# Patient Record
Sex: Female | Born: 1960 | Race: Black or African American | Hispanic: No | Marital: Single | Smoking: Never smoker
Health system: Southern US, Community
[De-identification: ages and names within clinical notes are randomized; demographics above are authoritative.]

## PROBLEM LIST (undated history)

## (undated) DIAGNOSIS — I10 Essential (primary) hypertension: Secondary | ICD-10-CM

## (undated) DIAGNOSIS — F329 Major depressive disorder, single episode, unspecified: Secondary | ICD-10-CM

## (undated) DIAGNOSIS — M549 Dorsalgia, unspecified: Secondary | ICD-10-CM

## (undated) DIAGNOSIS — F32A Depression, unspecified: Secondary | ICD-10-CM

## (undated) DIAGNOSIS — F419 Anxiety disorder, unspecified: Secondary | ICD-10-CM

## (undated) HISTORY — PX: MOHS SURGERY: SHX181

## (undated) HISTORY — PX: VAGINAL HYSTERECTOMY: SUR661

## (undated) HISTORY — DX: Anxiety disorder, unspecified: F41.9

## (undated) HISTORY — DX: Essential (primary) hypertension: I10

## (undated) HISTORY — DX: Major depressive disorder, single episode, unspecified: F32.9

## (undated) HISTORY — DX: Depression, unspecified: F32.A

## (undated) HISTORY — PX: CHOLECYSTECTOMY: SHX55

---

## 2014-07-09 HISTORY — PX: OTHER SURGICAL HISTORY: SHX169

## 2017-02-25 ENCOUNTER — Encounter: Payer: Self-pay | Admitting: Physical Therapy

## 2017-02-25 ENCOUNTER — Ambulatory Visit: Payer: Non-veteran care | Attending: Internal Medicine | Admitting: Physical Therapy

## 2017-02-25 DIAGNOSIS — M791 Myalgia, unspecified site: Secondary | ICD-10-CM

## 2017-02-25 DIAGNOSIS — M5442 Lumbago with sciatica, left side: Secondary | ICD-10-CM | POA: Insufficient documentation

## 2017-02-25 DIAGNOSIS — M25571 Pain in right ankle and joints of right foot: Secondary | ICD-10-CM

## 2017-02-25 DIAGNOSIS — M25572 Pain in left ankle and joints of left foot: Secondary | ICD-10-CM | POA: Diagnosis present

## 2017-02-25 DIAGNOSIS — G8929 Other chronic pain: Secondary | ICD-10-CM

## 2017-02-25 NOTE — Patient Instructions (Addendum)
Figure -4 stretch in chair   5 breaths   3-5 x day   __________    Proper body mechanics with getting out of a chair to decrease strain  on back &pelvic floor   Avoid holding your breath when Getting out of the chair:  Scoot to front part of chair chair Heels behind feet, feet are hip width apart, nose over toes  Inhale like you are smelling roses Exhale to stand    _______________  Sitting with feet under knees , flat on floor   _________________  [address] zag massage over abdominal scar

## 2017-02-26 NOTE — Therapy (Deleted)
[place] [facility] MAIN [facility] SERVICES [address] [place], [place], [address] Phone: [phone]   Fax:  [phone]  Physical Therapy Evaluation  Patient Details  Name: Carolyn Gregory MRN: [id number] Date of Birth: September 16, 1960 Referring Provider: Gertha Calkin, MD  Encounter Date: 02/25/2017      PT End of Session - 02/28/17 1454    Visit Number 1   Number of Visits 12   Date for PT Re-Evaluation 05/21/17   Authorization Type g code 1/10    PT Start Time 0805   PT Stop Time 0915   PT Time Calculation (min) 70 min   Activity Tolerance Patient tolerated treatment well;No increased pain   Behavior During Therapy WFL for tasks assessed/performed      Past Medical History:  Diagnosis Date  . Anxiety   . Depression   . Hypertension     Past Surgical History:  Procedure Laterality Date  . CHOLECYSTECTOMY    . MOHS SURGERY     to remove benign growth  located in the L low abdomen  . R ankle surgery   2016   to repair lateral tendon   . VAGINAL HYSTERECTOMY      There were no vitals filed for this visit.       Subjective Assessment - 02/28/17 1452    Subjective 1) R ankle pain: Pt injured herself in 2014. Pt had a fall where she had turned R ankle. PT did not help with rehab and she pushed for an MRI which she received and it showed tendon tear. Pt underwent surgery. Pt is able to walk on it but she experiences soreness. Sometimes she wears a brace for stability. Pt also injured her L ankle in 2016 after t/f from [facility] to toilet.  Pt use a cane when walking long distances because of her LBP/ groin pain as well    2) Pt noticed the R groin pain 6 months ago. Pt had a hsyterectomy in 1999.  Groin pain "burning" occurs most when she is driving, sitting 76/54. Pt also experiences this pain with urination, and laying on her R side. When she stands and shift weight to the R leg, her L groin hurts. Laying down on back  eases the pain 4/10, and eases  to 0/10 with muscle relaxers after 2 hours.    3) CLBP: Pt had slipped and fell, injuring the L LBP 2010. Pt has radiating pain down her posterior thigh and stops at knee.  Pt had Chiropractic Tx which was more helpful with PT.    4) Difficulty with urination: Pt noticed a vibration at the end of urination located in the vaginal area which made her think she was still trickling. Pt has interrupted flow of urine. SUI is reported. Urinary frequency 3x/ 2 hours.    5) Hx of constipation:  Pt has started to increase her water and take in prunes to eliminate 4x/ day. Pt had to strain in the past to eliminate.  Pt with 2 vaginal deliveries, perineal tears,      Pertinent History Pt is interested in seeking psychotherapy/ psychiatry for managing a stress in her life regarding a family member. Pt was tearful at the beginning of session.     Patient Stated Goals improve ROM and decrease pain             [facility] PT Assessment - 02/28/17 1452      Assessment   Medical Diagnosis LBP/ Groin pain  Referring Provider Gertha Calkin, MD     Precautions   Precautions None     Restrictions   Weight Bearing Restrictions No     Home Environment   Additional Comments 12 stairs      Prior Function   Level of Independence Independent     Sit to Stand   Comments no pain with correct technique      AROM   Overall AROM Comments ~30% spinal flexion/ side flexion B , rotation ~25% B with pain     Palpation   SI assessment  standing: L PSIS more anterior    Palpation comment tightness/ tenderness at Obt Int B    severely restricted scar over low abdomen      Ambulation/Gait   Assistive device Straight cane   Gait Comments B ankle instabiliy on stance phase             Objective measurements completed on examination: See above findings.          [facility] Adult PT Treatment/Exercise - 02/28/17 1452      Therapeutic Activites    Therapeutic Activities --  see pt instructions     Neuro  Re-ed    Neuro Re-ed Details  see pt instructions     Manual Therapy   Manual therapy comments long axis distraction , rotational mob, PA mob Grade II  with MWM                 PT Education - 02/28/17 1453    Education provided Yes   Education Details POC, anatomy/ physiology, goals, HEP   Person(s) Educated Patient   Methods Explanation;Demonstration;Tactile cues;Verbal cues;Handout   Comprehension Returned demonstration;Verbalized understanding             PT Long Term Goals - 02/25/17 1651      PT LONG TERM GOAL #1   Title Pt will decrease her ODI score from 48% to < 38% in order to return to ADLs   Time 12   Period Weeks   Status New   Target Date 05/20/17     PT LONG TERM GOAL #2   Title Pt will decrease her LEFS score from   25% to < 18%  in order to walk and have less pain at her R ankle.   Time 12   Period Weeks   Status New   Target Date 05/20/17     PT LONG TERM GOAL #3   Title Pt will demo less B ankle instability, improved gait in order to minimize risk for injuries and falls    Time 12   Period Weeks   Status New   Target Date 05/20/17     PT LONG TERM GOAL #4   Title Pt will demo no pelvic obliquities and normal arthrokinematic motion of SIJ in order to minimize radiating pain to walk longer distances   Time 4   Period Weeks   Status New   Target Date 03/25/17              Patient will benefit from skilled therapeutic intervention in order to improve the following deficits and impairments:  Abnormal gait, Pain, Increased fascial restricitons, Decreased strength, Decreased scar mobility, Increased muscle spasms, Difficulty walking, Decreased mobility, Decreased range of motion, Decreased balance, Decreased safety awareness, Decreased coordination, Postural dysfunction, Improper body mechanics, Decreased endurance  Visit Diagnosis: Chronic bilateral low back pain with left-sided sciatica  Myalgia  Pain in left ankle and joints  of  left foot  Pain in right ankle and joints of right foot     Problem List There are no active problems to display for this patient.   Mariane Masters  ,PT, DPT, E-RYT  02/28/2017, 3:09 PM  [place] [facility] MAIN [facility] SERVICES [address] [place], [place], [address] Phone: [phone]   Fax:  [phone]  Name: SHANNETTE TABARES MRN: [id number] Date of Birth: 10-Jul-1960

## 2017-02-26 NOTE — Therapy (Deleted)
[facility] MAIN [facility] SERVICES [address] [place], [place], [address] Phone: [phone]   Fax:  [phone]  Physical Therapy Evaluation  Patient Details  Name: MEMORY BANNON MRN: [id number] Date of Birth: 24-Sep-1960 Referring Provider: Gertha Calkin, MD   Encounter Date: 02/25/2017      PT End of Session - 02/26/17 1818    Visit Number 1   Number of Visits 12   Date for PT Re-Evaluation 05/21/17   Authorization Type g code 1/10    PT Start Time 0805   PT Stop Time 0915   PT Time Calculation (min) 70 min      Past Medical History:  Diagnosis Date  . Anxiety   . Depression   . Hypertension     Past Surgical History:  Procedure Laterality Date  . CHOLECYSTECTOMY    . MOHS SURGERY     to remove benign growth  located in the L low abdomen  . R ankle surgery   2016   to repair lateral tendon   . VAGINAL HYSTERECTOMY      There were no vitals filed for this visit.       Subjective Assessment - 02/26/17 1821    Subjective 1) R ankle pain: Pt injured herself in 2014. Pt had a fall where she had turned R ankle. PT did not help with rehab and she pushed for an MRI which she received and it showed tendon tear. Pt underwent surgery. Pt is able to walk on it but she experiences soreness. Sometimes she wears a brace for stability. Pt also injured her L ankle in 2016 after t/f from [facility] to toilet.  Pt use a cane when walking long distances because of her LBP/ groin pain as well  2) Pt noticed the R groin pain 6 months ago. Pt had a hsyterectomy in 1999.  Groin pain "burning" occurs most when she is driving, sitting 49/82. Pt also experiences this pain with urination, and laying on her R side. When she stands and shift weight to the R leg, her L groin hurts. Laying down on back  eases the pain 4/10, and eases to 0/10 with muscle relaxers after 2 hours.   3) CLBP: Pt had slipped and fell, injuring the L LBP 2010. Pt has radiating pain down her  posterior thigh and stops at knee.  Pt had Chiropractic Tx which was more helpful with PT.  4) Difficulty with urination: Pt noticed a vibration at the end of urination located in the vaginal area which made her think she was still trickling. Pt has interrupted flow of urine. SUI is reported. Urinary frequency 3x/ 2 hours.  5) Hx of constipation:  Pt has started to increase her water and take in prunes to eliminate 4x/ day. Pt had to strain in the past to eliminate.  Pt with 2 vaginal deliveries, perineal tears,      Pertinent History Pt is interested in seeking psychotherapy/ psychiatry for managing a stress in her life regarding a family member. Pt was tearful at the beginning of session.     Patient Stated Goals improve ROM and decrease pain             [facility] PT Assessment - 02/26/17 1819      Assessment   Medical Diagnosis LBP/ Groin pain    Referring Provider Gertha Calkin, MD      Precautions   Precautions None     Restrictions   Weight Bearing  Restrictions No     Home Environment   Additional Comments 12 stairs      Prior Function   Level of Independence Independent     Sit to Stand   Comments no pain with correct technique      AROM   Overall AROM Comments ~30% spinal flexion/ side flexion B , rotation ~25% B with pain     Palpation   SI assessment  standing: L PSIS more anterior    Palpation comment tightness/ tenderness at Obt Int B    severely restricted scar over low abdomen      Ambulation/Gait   Assistive device Straight cane   Gait Comments B ankle instabiliy on stance phase             Objective measurements completed on examination: See above findings.          [facility] Adult PT Treatment/Exercise - 02/26/17 1816      Therapeutic Activites    Therapeutic Activities --  see pt instructions     Neuro Re-ed    Neuro Re-ed Details  see pt isntructions     Manual Therapy   Manual therapy comments long axis distraction , rotational mob, PA mob  Grade II  with MWM                      PT Long Term Goals - 03/17/17 1651      PT LONG TERM GOAL #1   Title Pt will decrease her ODI score from 48% to < 38% in order to return to ADLs   Time 12   Period Weeks   Status New   Target Date 05/20/17     PT LONG TERM GOAL #2   Title Pt will decrease her LEFS score from   25% to < 18%  in order to walk and have less pain at her R ankle.   Time 12   Period Weeks   Status New   Target Date 05/20/17              Patient will benefit from skilled therapeutic intervention in order to improve the following deficits and impairments:     Visit Diagnosis: Chronic bilateral low back pain with left-sided sciatica  Myalgia  Pain in left ankle and joints of left foot  Pain in right ankle and joints of right foot      G-Codes - March 17, 2017 1817    Functional Assessment Tool Used (Outpatient Only) LEFS, ODI , gait speed    Functional Limitation Mobility: Walking and moving around   Mobility: Walking and Moving Around Current Status [phone]) At least 40 percent but less than 60 percent impaired, limited or restricted   Mobility: Walking and Moving Around Goal Status [phone]) At least 20 percent but less than 40 percent impaired, limited or restricted       Problem List There are no active problems to display for this patient.   Mariane Masters 02/26/2017, 6:21 PM  [facility] MAIN [facility] SERVICES [address] [place], [place], [address] Phone: [phone]   Fax:  [phone]  Name: RAYETTA VEITH MRN: [id number] Date of Birth: 07/29/1960

## 2017-02-28 NOTE — Addendum Note (Signed)
Addended by: Mariane Masters on: 02/28/2017 03:16 PM   Modules accepted: Orders

## 2017-02-28 NOTE — Therapy (Addendum)
[facility] MAIN [facility] SERVICES [address] [place], [place], [address] Phone: [phone]   Fax:  [phone]  Physical Therapy Evaluation  Patient Details  Name: Carolyn Gregory MRN: [id number] Date of Birth: [birth year] Referring Provider: Gertha Calkin, MD  Encounter Date: 02/25/2017      PT End of Session - 02/28/17 1454    Visit Number 1   Number of Visits 12   Date for PT Re-Evaluation 05/21/17   Authorization Type g code 1/10    PT Start Time 0805   PT Stop Time 0915   PT Time Calculation (min) 70 min   Activity Tolerance Patient tolerated treatment well;No increased pain   Behavior During Therapy WFL for tasks assessed/performed      Past Medical History:  Diagnosis Date  . Anxiety   . Depression   . Hypertension     Past Surgical History:  Procedure Laterality Date  . CHOLECYSTECTOMY    . MOHS SURGERY     to remove benign growth  located in the L low abdomen  . R ankle surgery   2016   to repair lateral tendon   . VAGINAL HYSTERECTOMY      There were no vitals filed for this visit.       Subjective Assessment - 02/28/17 1452    Subjective 1) R ankle pain: Pt injured herself in 2014. Pt had a fall where she had turned R ankle. PT did not help with rehab and she pushed for an MRI which she received and it showed tendon tear. Pt underwent surgery. Pt is able to walk on it but she experiences soreness. Sometimes she wears a brace for stability. Pt also injured her L ankle in 2016 after t/f from [facility] to toilet.  Pt use a cane when walking long distances because of her LBP/ groin pain as well  2) Pt noticed the R groin pain 6 months ago. Pt had a hsyterectomy in 1999.  Groin pain "burning" occurs most when she is driving, sitting 13/24. Pt also experiences this pain with urination, and laying on her R side. When she stands and shift weight to the R leg, her L groin hurts. Laying down on back  eases the pain 4/10, and eases to  0/10 with muscle relaxers after 2 hours.   3) CLBP: Pt had slipped and fell, injuring the L LBP 2010. Pt has radiating pain down her posterior thigh and stops at knee.  Pt had Chiropractic Tx which was more helpful with PT.  4) Difficulty with urination: Pt noticed a vibration at the end of urination located in the vaginal area which made her think she was still trickling. Pt has interrupted Gregory of urine. SUI is reported. Urinary frequency 3x/ 2 hours.  5) Hx of constipation:  Pt has started to increase her water and take in prunes to eliminate 4x/ day. Pt had to strain in the past to eliminate.  Pt with 2 vaginal deliveries, perineal tears,      Pertinent History Pt is interested in seeking psychotherapy/ psychiatry for managing a stress in her life regarding a family member. Pt was tearful at the beginning of session.     Patient Stated Goals improve ROM and decrease pain             [facility] PT Assessment - 02/28/17 1452      Assessment   Medical Diagnosis LBP/ Groin pain    Referring Provider Gertha Calkin, MD  Precautions   Precautions None     Restrictions   Weight Bearing Restrictions No     Home Environment   Additional Comments 12 stairs      Prior Function   Level of Independence Independent     Sit to Stand   Comments no pain with correct technique      AROM   Overall AROM Comments ~30% spinal flexion/ side flexion B , rotation ~25% B with pain     Palpation   SI assessment  standing: L PSIS more anterior    Palpation comment tightness/ tenderness at Obt Int B    severely restricted scar over low abdomen      Ambulation/Gait   Assistive device Straight cane   Gait Comments B ankle instabiliy on stance phase             Objective measurements completed on examination: See above findings.          [facility] Adult PT Treatment/Exercise - 02/28/17 1452      Therapeutic Activites    Therapeutic Activities --  see pt instructions     Neuro Re-ed    Neuro  Re-ed Details  see pt instructions     Manual Therapy   Manual therapy comments long axis distraction , rotational mob, PA mob Grade II  with MWM                 PT Education - 02/28/17 1453    Education provided Yes   Education Details POC, anatomy/ physiology, goals, HEP   Person(s) Educated Patient   Methods Explanation;Demonstration;Tactile cues;Verbal cues;Handout   Comprehension Returned demonstration;Verbalized understanding             PT Long Term Goals - 02/25/17 1651      PT LONG TERM GOAL #1   Title Pt will decrease her ODI score from 48% to < 38% in order to return to ADLs   Time 12   Period Weeks   Status New   Target Date 05/20/17     PT LONG TERM GOAL #2   Title Pt will decrease her LEFS score from   25% to < 18%  in order to walk and have less pain at her R ankle.   Time 12   Period Weeks   Status New   Target Date 05/20/17     PT LONG TERM GOAL #3   Title Pt will demo less B ankle instability, improved gait in order to minimize risk for injuries and falls    Time 12   Period Weeks   Status New   Target Date 05/20/17     PT LONG TERM GOAL #4   Title Pt will demo no pelvic obliquities and normal arthrokinematic motion of SIJ in order to minimize radiating pain to walk longer distances   Time 4   Period Weeks   Status New   Target Date 03/25/17     PT LONG TERM GOAL #5   Title Pt will demo no pelvic floor mm tensions and proper ROM in order to eliminate urine and restore pelvic floor function   Time 12   Period Weeks   Status New   Target Date 05/27/17     Additional Long Term Goals   Additional Long Term Goals Yes     PT LONG TERM GOAL #6   Title Pt will demo decreased scar restrictions over abdomen, IND with abdominal massage, and report more regular bowel movements of 1-2  days in order to restore GI health   Time 12   Period Weeks   Status New   Target Date 05/27/17                Plan - 02/28/17 1510    Clinical  Impression Statement Pt is a 56 yo female who reports pain her low back, R groin, and ankle in addition to pelvic floor dysfunction such as  difficulty with urination and constipation. Pt's clinical presentation include pelvic obliquities, limited spinal/ SIJ ROM, pelvic floor mm overactivity, abdominal scar restrictions, gait deviations, ankle instability, dyscoordination of deep core mm, weakness, and poor body mechanics.  Following Tx today, pt showed more mobility in L SIJ and less pain with spinal movements. Pt also demo'd proper sit to stand technique which did not cause pain.    Rehab Potential Good   PT Frequency 1x / week   PT Duration 12 weeks   PT Treatment/Interventions Functional mobility training;Stair training;Gait training;Patient/family education;Aquatic Therapy;Therapeutic activities;Manual lymph drainage;Moist Heat;Neuromuscular re-education;Therapeutic exercise;Manual techniques;Taping;Energy conservation   Consulted and Agree with Plan of Care Patient      Patient will benefit from skilled therapeutic intervention in order to improve the following deficits and impairments:  Abnormal gait, Pain, Increased fascial restricitons, Decreased strength, Decreased scar mobility, Increased muscle spasms, Difficulty walking, Decreased mobility, Decreased range of motion, Decreased balance, Decreased safety awareness, Decreased coordination, Postural dysfunction, Improper body mechanics, Decreased endurance  Visit Diagnosis: Chronic bilateral low back pain with left-sided sciatica  Myalgia  Pain in left ankle and joints of left foot  Pain in right ankle and joints of right foot     Problem List There are no active problems to display for this patient.   Mariane Masters ,PT, DPT, E-RYT  02/28/2017, 3:14 PM  [facility] MAIN [facility] SERVICES [address] [place], [place], [address] Phone: [phone]   Fax:  [phone]  Name: Carolyn Gregory MRN: [id number] Date of Birth: 1961-04-26

## 2017-03-12 ENCOUNTER — Ambulatory Visit: Payer: Non-veteran care | Attending: Internal Medicine | Admitting: Physical Therapy

## 2017-03-12 DIAGNOSIS — M5442 Lumbago with sciatica, left side: Secondary | ICD-10-CM | POA: Diagnosis present

## 2017-03-12 DIAGNOSIS — M25572 Pain in left ankle and joints of left foot: Secondary | ICD-10-CM | POA: Diagnosis present

## 2017-03-12 DIAGNOSIS — G8929 Other chronic pain: Secondary | ICD-10-CM | POA: Insufficient documentation

## 2017-03-12 DIAGNOSIS — M791 Myalgia, unspecified site: Secondary | ICD-10-CM

## 2017-03-12 DIAGNOSIS — M25571 Pain in right ankle and joints of right foot: Secondary | ICD-10-CM

## 2017-03-12 NOTE — Therapy (Signed)
[facility] MAIN [facility] SERVICES [address] [place], [place], [address] Phone: [phone]   Fax:  [phone]  Physical Therapy Treatment  Patient Details  Name: Carolyn Gregory MRN: [id number] Date of Birth: 1961/07/04 Referring Provider: Gertha Calkin, MD  Encounter Date: 03/12/2017      PT End of Session - 03/12/17 1108    Visit Number 2   Number of Visits 12   Date for PT Re-Evaluation 05/21/17   Authorization Type g code 2/10    PT Start Time 1015   PT Stop Time 1110   PT Time Calculation (min) 55 min   Activity Tolerance Patient tolerated treatment well;No increased pain   Behavior During Therapy WFL for tasks assessed/performed      Past Medical History:  Diagnosis Date  . Anxiety   . Depression   . Hypertension     Past Surgical History:  Procedure Laterality Date  . CHOLECYSTECTOMY    . MOHS SURGERY     to remove benign growth  located in the L low abdomen  . R ankle surgery   2016   to repair lateral tendon   . VAGINAL HYSTERECTOMY      There were no vitals filed for this visit.      Subjective Assessment - 03/12/17 1017    Subjective Pt reports the frequency of burning in the R groin has decreased since last session. The frequency of the numbness in the R posterior thigh has also decreased with the stretches.  Pt found comfort in sitting in the driver seat with a pillow prior to PT and she not longer needs the pillow because the pain is not has excuriating.  The numbness /tingling is worst in the R posterior thigh > L.  Pt reports the standing posture technique has helped to decrease the catch in her back     Pertinent History Pt is interested in seeking psychotherapy/ psychiatry for managing a stress in her life regarding a family member. Pt was tearful at the beginning of session.     Patient Stated Goals improve ROM and decrease pain             [facility] PT Assessment - 03/12/17 1052      Palpation   Palpation comment L SIJ hypomobility, limited into hip ext PROM, tenderness and tightness along sacrotuberous ligament      Bed Mobility   Bed Mobility --  bed mobility with proper mechanics w/ minor cues. less pain                      [facility] Adult PT Treatment/Exercise - 03/12/17 1055      Neuro Re-ed    Neuro Re-ed Details  see pt instructions     Modalities   Modalities --  heat, posterior gluts     Manual Therapy   Manual therapy comments LLE  long axis distraction , rotational mob, PA mob Grade II  with MWM                 PT Education - 03/12/17 1108    Education provided Yes   Education Details HEP   Person(s) Educated Patient   Methods Explanation;Demonstration;Tactile cues;Verbal cues;Handout   Comprehension Returned demonstration;Verbalized understanding             PT Long Term Goals - 02/25/17 1651      PT LONG TERM GOAL #1   Title Pt will decrease her ODI  score from 48% to < 38% in order to return to ADLs   Time 12   Period Weeks   Status New   Target Date 05/20/17     PT LONG TERM GOAL #2   Title Pt will decrease her LEFS score from   25% to < 18%  in order to walk and have less pain at her R ankle.   Time 12   Period Weeks   Status New   Target Date 05/20/17     PT LONG TERM GOAL #3   Title Pt will demo less B ankle instability, improved gait in order to minimize risk for injuries and falls    Time 12   Period Weeks   Status New   Target Date 05/20/17     PT LONG TERM GOAL #4   Title Pt will demo no pelvic obliquities and normal arthrokinematic motion of SIJ in order to minimize radiating pain to walk longer distances   Time 4   Period Weeks   Status New   Target Date 03/25/17     PT LONG TERM GOAL #5   Title Pt will demo no pelvic floor mm tensions and proper ROM in order to eliminate urine and restore pelvic floor function   Time 12   Period Weeks   Status New   Target Date 05/27/17     Additional Long Term  Goals   Additional Long Term Goals Yes     PT LONG TERM GOAL #6   Title Pt will demo decreased scar restrictions over abdomen, IND with abdominal massage, and report more regular bowel movements of 1-2 days in order to restore GI health   Time 12   Period Weeks   Status New   Target Date 05/27/17               Plan - 03/12/17 1109    Clinical Impression Statement Pt reports her radiating pain has improved. Pt is able to get out of her chair with less pain. Pt stated she is feeling better mentally about her pain and remains positive about her progress. Today, manual Tx address tensions/ tenderness along L sacrotuberous ligament to increased L SIJ mobility. Pt demo'd less tenderness and tensions post Tx. Pt was able to also demo proper bed mobility mechanics with less pain. Added neuro-muscular re-edu to engage feet in deep core exercise to initiate awareness of lower kinetic chain connection to pelvis. Pt continues to benefit from skilled PT.    Rehab Potential Good   PT Frequency 1x / week   PT Duration 12 weeks   PT Treatment/Interventions Functional mobility training;Stair training;Gait training;Patient/family education;Aquatic Therapy;Therapeutic activities;Manual lymph drainage;Moist Heat;Neuromuscular re-education;Therapeutic exercise;Manual techniques;Taping;Energy conservation   Consulted and Agree with Plan of Care Patient      Patient will benefit from skilled therapeutic intervention in order to improve the following deficits and impairments:  Abnormal gait, Pain, Increased fascial restricitons, Decreased strength, Decreased scar mobility, Increased muscle spasms, Difficulty walking, Decreased mobility, Decreased range of motion, Decreased balance, Decreased safety awareness, Decreased coordination, Postural dysfunction, Improper body mechanics, Decreased endurance  Visit Diagnosis: Chronic bilateral low back pain with left-sided sciatica  Myalgia  Pain in left ankle and  joints of left foot  Pain in right ankle and joints of right foot     Problem List There are no active problems to display for this patient.   Mariane MastersYeung,Shin Yiing 03/12/2017, 11:35 AM  [facility] MAIN REHAB SERVICES  [address] [place], [place], [address] Phone: [phone]   Fax:  [phone]  Name: Carolyn Gregory MRN: [id number] Date of Birth: 27-Jun-1961

## 2017-03-12 NOTE — Patient Instructions (Addendum)
Deep core ( handout ) level (1-2)     Standing:  10 reps on both sides x 3 x day     3 point tap   Feet are hip width Tap forward, center under hip not feet next to each other  Tap middle\, center  Tap back

## 2017-03-18 ENCOUNTER — Ambulatory Visit: Payer: Non-veteran care | Admitting: Physical Therapy

## 2017-03-18 DIAGNOSIS — M5442 Lumbago with sciatica, left side: Principal | ICD-10-CM

## 2017-03-18 DIAGNOSIS — M791 Myalgia, unspecified site: Secondary | ICD-10-CM

## 2017-03-18 DIAGNOSIS — M25572 Pain in left ankle and joints of left foot: Secondary | ICD-10-CM

## 2017-03-18 DIAGNOSIS — G8929 Other chronic pain: Secondary | ICD-10-CM

## 2017-03-18 DIAGNOSIS — M25571 Pain in right ankle and joints of right foot: Secondary | ICD-10-CM

## 2017-03-18 NOTE — Therapy (Signed)
[place] [facility] MAIN [facility] SERVICES [address] [place], [place], [address] Phone: [phone]   Fax:  [phone]  Physical Therapy Treatment  Patient Details  Name: Carolyn Gregory MRN: [id number] Date of Birth: 1961-04-14 Referring Provider: Gertha Calkin, MD  Encounter Date: 03/18/2017      PT End of Session - 03/18/17 1306    Visit Number 3   Number of Visits 12   Date for PT Re-Evaluation 05/21/17   Authorization Type g code 3/10    PT Start Time 1000   PT Stop Time 1058   PT Time Calculation (min) 58 min   Activity Tolerance Patient tolerated treatment well;No increased pain   Behavior During Therapy WFL for tasks assessed/performed      Past Medical History:  Diagnosis Date  . Anxiety   . Depression   . Hypertension     Past Surgical History:  Procedure Laterality Date  . CHOLECYSTECTOMY    . MOHS SURGERY     to remove benign growth  located in the L low abdomen  . R ankle surgery   2016   to repair lateral tendon   . VAGINAL HYSTERECTOMY      There were no vitals filed for this visit.      Subjective Assessment - 03/18/17 0927    Subjective Pt reports there was some soreness and slight discomfort in the L side of her gluts following last session.    Pertinent History Pt is interested in seeking psychotherapy/ psychiatry for managing a stress in her life regarding a family member. Pt was tearful at the beginning of session.     Patient Stated Goals improve ROM and decrease pain             [facility] PT Assessment - 03/18/17 1008      Palpation   Palpation comment increased L hip ext mobility, increased mm tensions at posterior iliac crest ( glut )      Ambulation/Gait   Gait Comments narrow BOS, posterior COM, limited hip flexion and decreased stance phase                      [facility] Adult PT Treatment/Exercise - 03/18/17 1005      Neuro Re-ed    Neuro Re-ed Details  gait training with blue band at  waist, cued for anterior COM, hip flexion, increased weightbearing into stance phase and feet wider      Exercises   Exercises --  see pt instructions     Manual Therapy   Manual therapy comments LLE  long axis distraction , rotational mob, PA mob Grade II  with MWM                 PT Education - 03/18/17 1004    Education provided Yes   Education Details HEP   Person(s) Educated Patient   Methods Explanation;Demonstration;Tactile cues;Verbal cues;Handout   Comprehension Returned demonstration;Verbalized understanding             PT Long Term Goals - 02/25/17 1651      PT LONG TERM GOAL #1   Title Pt will decrease her ODI score from 48% to < 38% in order to return to ADLs   Time 12   Period Weeks   Status New   Target Date 05/20/17     PT LONG TERM GOAL #2   Title Pt will decrease her LEFS score from   25% to <  18%  in order to walk and have less pain at her R ankle.   Time 12   Period Weeks   Status New   Target Date 05/20/17     PT LONG TERM GOAL #3   Title Pt will demo less B ankle instability, improved gait in order to minimize risk for injuries and falls    Time 12   Period Weeks   Status New   Target Date 05/20/17     PT LONG TERM GOAL #4   Title Pt will demo no pelvic obliquities and normal arthrokinematic motion of SIJ in order to minimize radiating pain to walk longer distances   Time 4   Period Weeks   Status New   Target Date 03/25/17     PT LONG TERM GOAL #5   Title Pt will demo no pelvic floor mm tensions and proper ROM in order to eliminate urine and restore pelvic floor function   Time 12   Period Weeks   Status New   Target Date 05/27/17     Additional Long Term Goals   Additional Long Term Goals Yes     PT LONG TERM GOAL #6   Title Pt will demo decreased scar restrictions over abdomen, IND with abdominal massage, and report more regular bowel movements of 1-2 days in order to restore GI health   Time 12   Period Weeks    Status New   Target Date 05/27/17               Plan - 03/18/17 1010    Clinical Impression Statement Pt demo'd increased L hip extension and decreased tensions in the glut compared to last session. Contiued to apply manual Tx to minimize tightness at proximal attachments of glut mm at posterior iliac crest. Provided gait training today to minmize posterior COM, increase hip flexion and weight bearing in stance phase. Pt will continue to increase postural stability and decrease thoracic hypomobility to minimize excessive pelvic movements and increase arm swing/ trunk rotation. Pt is continuing to benefit from skilled PT    Rehab Potential Good   PT Frequency 1x / week   PT Duration 12 weeks   PT Treatment/Interventions Functional mobility training;Stair training;Gait training;Patient/family education;Aquatic Therapy;Therapeutic activities;Manual lymph drainage;Moist Heat;Neuromuscular re-education;Therapeutic exercise;Manual techniques;Taping;Energy conservation   Consulted and Agree with Plan of Care Patient      Patient will benefit from skilled therapeutic intervention in order to improve the following deficits and impairments:  Abnormal gait, Pain, Increased fascial restricitons, Decreased strength, Decreased scar mobility, Increased muscle spasms, Difficulty walking, Decreased mobility, Decreased range of motion, Decreased balance, Decreased safety awareness, Decreased coordination, Postural dysfunction, Improper body mechanics, Decreased endurance  Visit Diagnosis: Chronic bilateral low back pain with left-sided sciatica  Myalgia  Pain in left ankle and joints of left foot  Pain in right ankle and joints of right foot     Problem List There are no active problems to display for this patient.   Carolyn Gregory,Carolyn Gregory ,PT, DPT, E-RYT  03/18/2017, 1:10 PM  [facility] [address] , [place], [address] Phone:  [phone]   Fax:  [phone]  Name: Carolyn Gregory MRN: [id number] Date of Birth: [birth year]

## 2017-03-18 NOTE — Patient Instructions (Signed)
Side of hip stretch:  Reclined twist for hips and side of the hips/ legs  Lay on your back, knees bend Scoot hips to the R , leave shoulders in place Strap on ballmounds of R foot, hold strap in L hand Straighten L leg and drop R leg over to the L, resting onto pillows to keep leg at the same width of hips 5 breaths   Switch sides    _______  Walking with feet hip width apart  Arm swings  _________   Stretches   Six direction  of spine :  Arm swings Side to side hand on shoulder  5 reps each   Mini squat ( forward lean)  To rise up,  feet into ground, knees forward, scoop pelvis under, chest lift  Stand with knees slightly unlocked

## 2017-03-26 ENCOUNTER — Ambulatory Visit: Payer: Non-veteran care | Admitting: Physical Therapy

## 2017-03-26 DIAGNOSIS — M5442 Lumbago with sciatica, left side: Principal | ICD-10-CM

## 2017-03-26 DIAGNOSIS — G8929 Other chronic pain: Secondary | ICD-10-CM

## 2017-03-26 DIAGNOSIS — M25572 Pain in left ankle and joints of left foot: Secondary | ICD-10-CM

## 2017-03-26 DIAGNOSIS — M791 Myalgia, unspecified site: Secondary | ICD-10-CM

## 2017-03-26 DIAGNOSIS — M25571 Pain in right ankle and joints of right foot: Secondary | ICD-10-CM

## 2017-03-26 NOTE — Patient Instructions (Addendum)
[email or web address] Counselor  ?[phone]    Caryn Section, MD , PA  Psychiatrist  [address] [facility] [phone]   _________  Prone Heel Press for strengthening sacro-iliac joints  1. Lie on your belly. If you have an arch in your low back or it feels umcomfortable, place a pillow under your low belly/hips to make sure your low back feel comfortable.   2. Place our forehead on top of your palms.      Widen your knees apart for starting position.   3. Inhale, feel belly and low back expand  4. Exhale, feel belly hug in, press heel together and count aloud for 5 sec. Then relax the heel squeezing.  Perform 10 reps of 5 sec holds. 2 sets/ day.    If you feel entire buttock tighten too much or feel low back pain, apply 50% less effort. As you press your heel together, you will feel as if your pubic bone (front of your pelvis) and sacrum (back of your pelvis) gentle move towards each other or your low abdominal muscles hug in more.       And     Squeezing a double foled pillow between knees   10 reps   5 sec   _______   Last session's 3 point tap on both legs

## 2017-03-26 NOTE — Therapy (Addendum)
[place] MAIN [facility] SERVICES [address] [place], [place], [address] Phone: [phone]   Fax:  [phone]  Physical Therapy Treatment  Patient Details  Name: Carolyn Gregory MRN: [id number] Date of Birth: 06-Jan-1961 Referring Provider: Marsh Dolly, MD  Encounter Date: 03/26/2017      PT End of Session - 03/26/17 1126    Visit Number 4   Number of Visits 12   Date for PT Re-Evaluation 05/21/17   Authorization Type g code 4/10    PT Start Time 6962   PT Stop Time 1128   PT Time Calculation (min) 73 min   Activity Tolerance Patient tolerated treatment well;No increased pain   Behavior During Therapy WFL for tasks assessed/performed      Past Medical History:  Diagnosis Date  . Anxiety   . Depression   . Hypertension     Past Surgical History:  Procedure Laterality Date  . CHOLECYSTECTOMY    . MOHS SURGERY     to remove benign growth  located in the L low abdomen  . R ankle surgery   2016   to repair lateral tendon   . VAGINAL HYSTERECTOMY      There were no vitals filed for this visit.      Subjective Assessment - 03/26/17 1020    Subjective Pt reports she feel improvement in the burning sensation  in her R groin. Pt reported she trusted the process and did her homework and realized that after last session, the treatment did not make her L LBP worst and she recalled the education PT explained and was able to push past the discomfort. Today, ptsays "she is feeling ok".  Pt is concerned about her R side on the tight rubber band feeling posterior thigh and burning feeling in the seated position but these sensatiosn are not has bad as when she initially started her sessions here.      Pertinent History Pt is interested in seeking psychotherapy/ psychiatry for managing a stress in her life regarding a family member. Pt was tearful at the beginning of session.     Patient Stated Goals improve ROM and decrease pain              [facility] PT Assessment - 03/26/17 1122      AROM   Overall AROM Comments pain but no burning at R groin with hip flex/ IR and hip flex / ER abd. ( post Tx: no pain) . tightness at posterior thigh, sacrotuberous ligament, coccgygeus R, limited mobility of sacrum into counternutation      Palpation   SI assessment  R ASIS more inferior ( post Tx: levelled)                      [facility] Adult PT Treatment/Exercise - 03/26/17 1122      Exercises   Exercises --  see pt instructions     Modalities   Modalities --  heat, posterior gluts     Manual Therapy   Manual therapy comments:   Pt was explained about doffing pants and undergarments if need to perform internal pelvic assessment. Pt was explained the details of the exam and pt consented verbally.  RLE long axis distraction, STM along R coccygeus, inferior mob of sacrum, MET, counterstrain for pubic symphysis realignment, MWM at supra pubic with hip IR/ER, and AP mob for SIJ mobility    . All manual Tx was performed externally and internal pelvic  floor assessment was not needed today.                 PT Education - 03/26/17 1043    Education provided Yes   Education Details HEP   Person(s) Educated Patient   Methods Explanation;Demonstration;Tactile cues;Verbal cues;Handout   Comprehension Returned demonstration;Verbalized understanding             PT Long Term Goals - 03/26/17 1135      PT LONG TERM GOAL #1   Title Pt will decrease her ODI score from 48% to < 38% in order to return to ADLs   Time 12   Period Weeks   Status On-going     PT LONG TERM GOAL #2   Title Pt will decrease her LEFS score from   25% to < 18%  in order to walk and have less pain at her R ankle.   Time 12   Period Weeks   Status On-going     PT LONG TERM GOAL #3   Title Pt will demo less B ankle instability, improved gait in order to minimize risk for injuries and falls    Time 12   Period Weeks   Status On-going      PT LONG TERM GOAL #4   Title Pt will demo no pelvic obliquities and normal arthrokinematic motion of SIJ in order to minimize radiating pain to walk longer distances   Time 4   Period Weeks   Status Partially Met     PT LONG TERM GOAL #5   Title Pt will demo no pelvic floor mm tensions and proper ROM in order to eliminate urine and restore pelvic floor function   Time 12   Period Weeks   Status On-going     PT LONG TERM GOAL #6   Title Pt will demo decreased scar restrictions over abdomen, IND with abdominal massage, and report more regular bowel movements of 1-2 days in order to restore GI health   Time 12   Period Weeks   Status On-going               Plan - 03/26/17 1126    Clinical Impression Statement Pt is progressing well with less burning sensation in the R groin area and is able to walking better. Pt showed improved sacral mobility and a symmetrically levelled pelvic girdle. pPt achieved increased hip ROM in hip flex/ abd/add/ IR/ ER without burning sensation and pain.  Pt continues to benefit from skilled PT to address her sacoiliac dysfunction, LBP, and ankle stability issues.  Pt was provided psychotherapy referrals in the area per her request.        Rehab Potential Good   PT Frequency 1x / week   PT Duration 12 weeks   PT Treatment/Interventions Functional mobility training;Stair training;Gait training;Patient/family education;Aquatic Therapy;Therapeutic activities;Manual lymph drainage;Moist Heat;Neuromuscular re-education;Therapeutic exercise;Manual techniques;Taping;Energy conservation   Consulted and Agree with Plan of Care Patient      Patient will benefit from skilled therapeutic intervention in order to improve the following deficits and impairments:  Abnormal gait, Pain, Increased fascial restricitons, Decreased strength, Decreased scar mobility, Increased muscle spasms, Difficulty walking, Decreased mobility, Decreased range of motion, Decreased balance,  Decreased safety awareness, Decreased coordination, Postural dysfunction, Improper body mechanics, Decreased endurance  Visit Diagnosis: Chronic bilateral low back pain with left-sided sciatica  Myalgia  Pain in left ankle and joints of left foot  Pain in right ankle and joints of right foot  Problem List There are no active problems to display for this patient.   [name] ,PT, DPT, E-RYT  03/26/2017, 11:37 AM  [place] MAIN [facility] SERVICES [address] [place], [place], [address] Phone: [phone]   Fax:  [phone]  Name: IRELYN PERFECTO MRN: [id number] Date of Birth: 03-23-1961

## 2017-04-08 ENCOUNTER — Ambulatory Visit: Payer: Non-veteran care | Attending: Internal Medicine | Admitting: Physical Therapy

## 2017-04-08 DIAGNOSIS — G8929 Other chronic pain: Secondary | ICD-10-CM | POA: Diagnosis present

## 2017-04-08 DIAGNOSIS — M5442 Lumbago with sciatica, left side: Secondary | ICD-10-CM | POA: Insufficient documentation

## 2017-04-08 DIAGNOSIS — M791 Myalgia, unspecified site: Secondary | ICD-10-CM | POA: Diagnosis present

## 2017-04-08 DIAGNOSIS — M25571 Pain in right ankle and joints of right foot: Secondary | ICD-10-CM | POA: Diagnosis present

## 2017-04-08 DIAGNOSIS — M25572 Pain in left ankle and joints of left foot: Secondary | ICD-10-CM | POA: Insufficient documentation

## 2017-04-08 NOTE — Patient Instructions (Signed)
Stretches for your legs: LAYING on Back Use upper arms and elbows for stability when pulling strap  For hip joint  Strap on ballmound: _ L knee bent, R ballmound against strap and spread toes, rolling foot 15 deg out and in across midline.  10 reps each side  _knee bends 10 reps    For hamstrings  _L toes point to armpit with knee straight, and then bend knee and straight 10 reps  _ L toes point towards midline, with knee straight, and then bend knee and straight 10 reps    _____  Scoot hips over to L , drop knees to pillow (between pillow)  Strap on L foot, straighten knee   ______  Laying on back, knees bent:  Heel slide back and rock knee to R and slide out

## 2017-04-09 ENCOUNTER — Ambulatory Visit: Payer: Non-veteran care | Admitting: Physical Therapy

## 2017-04-09 NOTE — Therapy (Addendum)
[place] MAIN [facility] SERVICES [address] [place], [place], [address] Phone: [phone]   Fax:  [phone]  Physical Therapy Treatment  Patient Details  Name: Carolyn Gregory MRN: [id number] Date of Birth: January 25, 1961 Referring Provider: Marsh Dolly, MD  Encounter Date: 04/08/2017      PT End of Session - 04/09/17 1255    Visit Number 5   Number of Visits 12   Date for PT Re-Evaluation 05/21/17   Authorization Type g code 5   PT Start Time 9371   PT Stop Time 1710   PT Time Calculation (min) 65 min   Activity Tolerance Patient tolerated treatment well;No increased pain   Behavior During Therapy WFL for tasks assessed/performed      Past Medical History:  Diagnosis Date  . Anxiety   . Depression   . Hypertension     Past Surgical History:  Procedure Laterality Date  . CHOLECYSTECTOMY    . MOHS SURGERY     to remove benign growth  located in the L low abdomen  . R ankle surgery   2016   to repair lateral tendon   . VAGINAL HYSTERECTOMY      There were no vitals filed for this visit.      Subjective Assessment - 04/08/17 1612    Subjective Pt noticed pain and soreness on the L glut area but she also notices she is gaining strength in her legs to get up from her chair without using arms and to walk with out her cane.  Last night, she had difficulty sleeping due to the degree of burning in the groin area on the R but it is overall improvement compared to the time before therapy.              [facility] PT Assessment - 04/09/17 1254      Palpation   Palpation comment tenderness/ tensions at hamstring semitendinosus, L glut med, limited mobility at L SIJ                   Pelvic Floor Special Questions - 04/08/17 1618    Pelvic Floor Internal Exam pt was explained the details of the exam and pt consented verbally. Exam was not performed due to addressing SIJ findings            [facility] Adult PT  Treatment/Exercise - 04/09/17 1255      Exercises   Exercises --  see pt instructions     Manual Therapy   Manual therapy comments AP mob L, hip flex/ add/ abd/ IR/ER,  STM along problem areas with MWM                  PT Education - 04/08/17 1617    Education provided Yes   Education Details HEP   Person(s) Educated Patient   Methods Explanation;Demonstration;Tactile cues;Verbal cues;Handout   Comprehension Returned demonstration;Verbalized understanding             PT Long Term Goals - 03/26/17 1135      PT LONG TERM GOAL #1   Title Pt will decrease her ODI score from 48% to < 38% in order to return to ADLs   Time 12   Period Weeks   Status On-going     PT LONG TERM GOAL #2   Title Pt will decrease her LEFS score from   25% to < 18%  in order to walk and have less pain at her R  ankle.   Time 12   Period Weeks   Status On-going     PT LONG TERM GOAL #3   Title Pt will demo less B ankle instability, improved gait in order to minimize risk for injuries and falls    Time 12   Period Weeks   Status On-going     PT LONG TERM GOAL #4   Title Pt will demo no pelvic obliquities and normal arthrokinematic motion of SIJ in order to minimize radiating pain to walk longer distances   Time 4   Period Weeks   Status Partially Met     PT LONG TERM GOAL #5   Title Pt will demo no pelvic floor mm tensions and proper ROM in order to eliminate urine and restore pelvic floor function   Time 12   Period Weeks   Status On-going     PT LONG TERM GOAL #6   Title Pt will demo decreased scar restrictions over abdomen, IND with abdominal massage, and report more regular bowel movements of 1-2 days in order to restore GI health   Time 12   Period Weeks   Status On-going               Plan - 04/09/17 1256    Clinical Impression Statement Pt is making progress with improve gait. Pt walked into clinic without cane but PT advised her to still use it in busy  environments.  Addressed remaining SIJ limitations and L glut/ hamstring tightness which decreased with manual Tx. Pt reported feeling less pain and soreness in these areas post Tx. Plan to address lower kinetic chain at next visit to address pt's Hx of ankle instability. Pt continues to benefit from skilled PT.    Rehab Potential Good   PT Frequency 1x / week   PT Duration 12 weeks   PT Treatment/Interventions Functional mobility training;Stair training;Gait training;Patient/family education;Aquatic Therapy;Therapeutic activities;Manual lymph drainage;Moist Heat;Neuromuscular re-education;Therapeutic exercise;Manual techniques;Taping;Energy conservation   Consulted and Agree with Plan of Care Patient      Patient will benefit from skilled therapeutic intervention in order to improve the following deficits and impairments:  Abnormal gait, Pain, Increased fascial restricitons, Decreased strength, Decreased scar mobility, Increased muscle spasms, Difficulty walking, Decreased mobility, Decreased range of motion, Decreased balance, Decreased safety awareness, Decreased coordination, Postural dysfunction, Improper body mechanics, Decreased endurance  Visit Diagnosis: Chronic bilateral low back pain with left-sided sciatica  Myalgia  Pain in left ankle and joints of left foot  Pain in right ankle and joints of right foot     Problem List There are no active problems to display for this patient.   [name] ,PT, DPT, E-RYT  04/09/2017, 12:59 PM  [place] MAIN [facility] SERVICES [address] [place], [place], [address] Phone: [phone]   Fax:  [phone]  Name: Carolyn Gregory MRN: [id number] Date of Birth: [birth year]

## 2017-04-23 ENCOUNTER — Ambulatory Visit: Payer: Non-veteran care | Admitting: Physical Therapy

## 2017-04-23 DIAGNOSIS — M25572 Pain in left ankle and joints of left foot: Secondary | ICD-10-CM

## 2017-04-23 DIAGNOSIS — M5442 Lumbago with sciatica, left side: Secondary | ICD-10-CM | POA: Diagnosis not present

## 2017-04-23 DIAGNOSIS — M791 Myalgia, unspecified site: Secondary | ICD-10-CM

## 2017-04-23 DIAGNOSIS — G8929 Other chronic pain: Secondary | ICD-10-CM

## 2017-04-23 DIAGNOSIS — M25571 Pain in right ankle and joints of right foot: Secondary | ICD-10-CM

## 2017-04-23 NOTE — Therapy (Signed)
[place] MAIN [facility] SERVICES [address] [place], [place], [address] Phone: [phone]   Fax:  [phone]  Physical Therapy Treatment  Patient Details  Name: Carolyn Gregory MRN: [id number] Date of Birth: March 15, 1961 Referring Provider: Marsh Dolly, MD  Encounter Date: 04/23/2017      PT End of Session - 04/23/17 1622    Visit Number 6   Number of Visits 12   Date for PT Re-Evaluation 05/21/17   Authorization Type g code 6   PT Start Time 9675   PT Stop Time 1710   PT Time Calculation (min) 55 min   Activity Tolerance Patient tolerated treatment well;No increased pain   Behavior During Therapy WFL for tasks assessed/performed      Past Medical History:  Diagnosis Date  . Anxiety   . Depression   . Hypertension     Past Surgical History:  Procedure Laterality Date  . CHOLECYSTECTOMY    . MOHS SURGERY     to remove benign growth  located in the L low abdomen  . R ankle surgery   2016   to repair lateral tendon   . VAGINAL HYSTERECTOMY      There were no vitals filed for this visit.      Subjective Assessment - 04/23/17 1622    Subjective Pt reported L calf has been cramping for several times a week since early June.  The cramping has become more frequent in the L.  The R calf has started to cramp up once a week.  Pt has been able to walk to the clniic without surfaces.  Pt continues to report the front pubic pain with burning with leaning forward.             [facility] PT Assessment - 04/23/17 1704      Coordination   Gross Motor Movements are Fluid and Coordinated --  R radiating pain down back of leg w/head turn deep core leve                  Pelvic Floor Special Questions - 04/23/17 1659    Pelvic Floor Internal Exam pt was explained the details of the exam and pt consented verbally without contraindications .    Exam Type Vaginal   Palpation burning sensation reported at R lateral border of  bladder wall,  bladder more distal position, post Tx: more cranial position with pillow under hips and manual Tx    Strength fair squeeze, definite lift   Strength # of reps --  4 reps,discontinued  due to report of burning   Strength # of seconds 1           [facility] Adult PT Treatment/Exercise - 04/23/17 1659      Neuro Re-ed    Neuro Re-ed Details  se pt instructions     Manual Therapy   Internal Pelvic Floor gentle thiele massage at bulbospongiosus R and lateral to bladder wall on R                  PT Education - 04/23/17 1705    Education provided Yes   Education Details HEP   Person(s) Educated Patient   Methods Explanation;Demonstration;Tactile cues;Verbal cues;Handout   Comprehension Returned demonstration;Verbalized understanding             PT Long Term Goals - 03/26/17 1135      PT LONG TERM GOAL #1   Title Pt will decrease her ODI score  from 48% to < 38% in order to return to ADLs   Time 12   Period Weeks   Status On-going     PT LONG TERM GOAL #2   Title Pt will decrease her LEFS score from   25% to < 18%  in order to walk and have less pain at her R ankle.   Time 12   Period Weeks   Status On-going     PT LONG TERM GOAL #3   Title Pt will demo less B ankle instability, improved gait in order to minimize risk for injuries and falls    Time 12   Period Weeks   Status On-going     PT LONG TERM GOAL #4   Title Pt will demo no pelvic obliquities and normal arthrokinematic motion of SIJ in order to minimize radiating pain to walk longer distances   Time 4   Period Weeks   Status Partially Met     PT LONG TERM GOAL #5   Title Pt will demo no pelvic floor mm tensions and proper ROM in order to eliminate urine and restore pelvic floor function   Time 12   Period Weeks   Status On-going     PT LONG TERM GOAL #6   Title Pt will demo decreased scar restrictions over abdomen, IND with abdominal massage, and report more regular bowel movements  of 1-2 days in order to restore GI health   Time 12   Period Weeks   Status On-going               Plan - 04/23/17 1706    Clinical Impression Statement Assessed pelvic floor today which showed a more lowered position of bladder. Burning sensation in the R anterior pubic/ groin area was reprouced with palpation by the lateral border of bladder. This sensation was improved after manual Tx and with pt being able to demo complete closure of pelvic floor mm in a sequential way.  Modified deep core exercise by discontinuing hip abduction /ER due to report of radiating pain.  Instructed small heel slides for deep core co-activation which pt reported did not cause radiating pain.  Pt was intructed to not perform kegels or strengthening due to return of burning sensation with contractions .  Pt continues to benefit from skilled PT        Rehab Potential Good   PT Frequency 1x / week   PT Duration 12 weeks   PT Treatment/Interventions Functional mobility training;Stair training;Gait training;Patient/family education;Aquatic Therapy;Therapeutic activities;Manual lymph drainage;Moist Heat;Neuromuscular re-education;Therapeutic exercise;Manual techniques;Taping;Energy conservation   Consulted and Agree with Plan of Care Patient      Patient will benefit from skilled therapeutic intervention in order to improve the following deficits and impairments:  Abnormal gait, Pain, Increased fascial restricitons, Decreased strength, Decreased scar mobility, Increased muscle spasms, Difficulty walking, Decreased mobility, Decreased range of motion, Decreased balance, Decreased safety awareness, Decreased coordination, Postural dysfunction, Improper body mechanics, Decreased endurance  Visit Diagnosis: Chronic bilateral low back pain with left-sided sciatica  Myalgia  Pain in left ankle and joints of left foot  Pain in right ankle and joints of right foot     Problem List There are no active problems  to display for this patient.   Carolyn Gregory ,PT, DPT, E-RYT  04/23/2017, 5:10 PM  [place] MAIN [facility] SERVICES [address] [place], [place], [address] Phone: [phone]   Fax:  [phone]  Name: Carolyn Gregory MRN:  [id number] Date of Birth: Dec 09, 1960

## 2017-04-23 NOTE — Patient Instructions (Signed)
Deep core level 2  ( hip elevated by pillow)

## 2017-05-07 ENCOUNTER — Ambulatory Visit: Payer: Non-veteran care | Admitting: Physical Therapy

## 2017-05-07 DIAGNOSIS — M5442 Lumbago with sciatica, left side: Principal | ICD-10-CM

## 2017-05-07 DIAGNOSIS — M791 Myalgia, unspecified site: Secondary | ICD-10-CM

## 2017-05-07 DIAGNOSIS — G8929 Other chronic pain: Secondary | ICD-10-CM

## 2017-05-07 DIAGNOSIS — M25572 Pain in left ankle and joints of left foot: Secondary | ICD-10-CM

## 2017-05-07 DIAGNOSIS — M25571 Pain in right ankle and joints of right foot: Secondary | ICD-10-CM

## 2017-05-07 NOTE — Therapy (Signed)
[place] MAIN [facility] SERVICES [address] [place], [place], [address] Phone: [phone]   Fax:  [phone]  Physical Therapy Treatment / Progress Note  Patient Details  Name: Carolyn Gregory MRN: [id number] Date of Birth: Mar 17, 1961 Referring Provider: Marsh Dolly, MD  Encounter Date: 05/07/2017      PT End of Session - 05/07/17 1101    Visit Number 7   Number of Visits 12   Date for PT Re-Evaluation 05/21/17   Authorization Type g code 7   PT Start Time 1013   PT Stop Time 1100   PT Time Calculation (min) 47 min   Activity Tolerance Patient tolerated treatment well;No increased pain   Behavior During Therapy WFL for tasks assessed/performed      Past Medical History:  Diagnosis Date  . Anxiety   . Depression   . Hypertension     Past Surgical History:  Procedure Laterality Date  . CHOLECYSTECTOMY    . MOHS SURGERY     to remove benign growth  located in the L low abdomen  . R ankle surgery   2016   to repair lateral tendon   . VAGINAL HYSTERECTOMY      There were no vitals filed for this visit.      Subjective Assessment - 05/07/17 1020    Subjective Pt reports R groin pain is no where near the excruiating pain where it was before. Pt notices it more with bending forward to reach something and when the leg is extended with driving. Pt feels radiating numbness on the posterior R thigh in all positions but not as intense.             [facility] PT Assessment - 05/07/17 1051      Strength   Overall Strength Comments toe extensions, PF, DF  B 4/5,                   Pelvic Floor Special Questions - 05/07/17 1050    Pelvic Floor Internal Exam pt was explained the details of the exam and pt consented verbally without contraindications .    Exam Type Vaginal   Palpation decreased burning sensation ischial spine 10/10 at last session, today 6/10 R    increased tensions at obt int/ iliococcygeus R > L             [facility] Adult PT Treatment/Exercise - 05/07/17 1049      Exercises   Exercises --  see pt instructions     Modalities   Modalities --  heat, posterior gluts/ perineum with exercises     Manual Therapy   Internal Pelvic Floor gentle thiele massage at R obt int /iliococcgyeus                 PT Education - 05/07/17 1101    Education provided Yes   Education Details HEP   Person(s) Educated Patient   Methods Explanation;Demonstration;Tactile cues;Verbal cues;Handout   Comprehension Returned demonstration;Verbalized understanding             PT Long Term Goals - 05/07/17 1105      PT LONG TERM GOAL #1   Title Pt will decrease her ODI score from 48% to < 38% in order to return to ADLs ( 10/30: 46%)    Time 12   Period Weeks   Status Partially Met     PT LONG TERM GOAL #2   Title Pt will decrease her LEFS score  from   25% to < 18%  in order to walk and have less pain at her R ankle.    Time 12   Period Weeks   Status Partially Met     PT LONG TERM GOAL #3   Title Pt will demo less B ankle instability, improved gait in order to minimize risk for injuries and falls    Time 12   Period Weeks   Status Partially Met     PT LONG TERM GOAL #4   Title Pt will demo no pelvic obliquities and normal arthrokinematic motion of SIJ in order to minimize radiating pain to walk longer distances   Time 4   Period Weeks   Status Achieved     PT LONG TERM GOAL #5   Title Pt will demo no pelvic floor mm tensions and proper ROM in order to eliminate urine and restore pelvic floor function   Time 12   Period Weeks   Status Partially Met     PT LONG TERM GOAL #6   Title Pt will demo decreased scar restrictions over abdomen, IND with abdominal massage, and report more regular bowel movements of 1-2 days in order to restore GI health   Time 12   Period Weeks   Status Achieved               Plan - 05/07/17 1101    Clinical Impression Statement Pt demo'd  signficantly less pelvic floor tightness with a signficantly improved position of bladder that is no longer lowered. Pt also showed weakness in B ankles which new HEP have been implemented to address in open and closed chain positions. Given pt's history of ankle issues, an interdependent regional approach to strengthen ankle strength will help minimize overactivity of pelvic floor for longer lasting benefits.  Pt continues to reported decreased radiating pain in R posterior thigh and less pain in the R groin. Pt is progressing well towatrds her goals with skilled PT.     Rehab Potential Good   PT Frequency 1x / week   PT Duration 12 weeks   PT Treatment/Interventions Functional mobility training;Stair training;Gait training;Patient/family education;Aquatic Therapy;Therapeutic activities;Manual lymph drainage;Moist Heat;Neuromuscular re-education;Therapeutic exercise;Manual techniques;Taping;Energy conservation   Consulted and Agree with Plan of Care Patient      Patient will benefit from skilled therapeutic intervention in order to improve the following deficits and impairments:  Abnormal gait, Pain, Increased fascial restricitons, Decreased strength, Decreased scar mobility, Increased muscle spasms, Difficulty walking, Decreased mobility, Decreased range of motion, Decreased balance, Decreased safety awareness, Decreased coordination, Postural dysfunction, Improper body mechanics, Decreased endurance  Visit Diagnosis: Chronic bilateral low back pain with left-sided sciatica  Myalgia  Pain in left ankle and joints of left foot  Pain in right ankle and joints of right foot     Problem List There are no active problems to display for this patient.   Carolyn Gregory ,PT, DPT, E-RYT  05/07/2017, 5:01 PM  [place] MAIN [facility] SERVICES [address] [place], [place], [address] Phone: [phone]   Fax:  [phone]  Name: Carolyn Gregory MRN:  [id number] Date of Birth: December 08, 1960

## 2017-05-07 NOTE — Patient Instructions (Signed)
   Standing:  10 reps on both sides x 3 x day     _Figure-4 and then toe touch to the ground behind you along a diagonal     _____  Ankle in and out on the L foot with orange band  Ankle out ( pinky toe up and swipe)   on R foot   30 each

## 2017-05-21 ENCOUNTER — Encounter: Payer: Self-pay | Admitting: *Deleted

## 2017-05-21 ENCOUNTER — Emergency Department: Payer: Non-veteran care

## 2017-05-21 ENCOUNTER — Emergency Department
Admission: EM | Admit: 2017-05-21 | Discharge: 2017-05-21 | Disposition: A | Discharge status: Home / Self Care | Payer: Non-veteran care | Attending: Emergency Medicine | Admitting: Emergency Medicine

## 2017-05-21 ENCOUNTER — Ambulatory Visit: Payer: Non-veteran care | Attending: Internal Medicine | Admitting: Physical Therapy

## 2017-05-21 DIAGNOSIS — R002 Palpitations: Secondary | ICD-10-CM | POA: Diagnosis not present

## 2017-05-21 DIAGNOSIS — M25572 Pain in left ankle and joints of left foot: Secondary | ICD-10-CM

## 2017-05-21 DIAGNOSIS — I10 Essential (primary) hypertension: Secondary | ICD-10-CM | POA: Diagnosis not present

## 2017-05-21 DIAGNOSIS — M25472 Effusion, left ankle: Secondary | ICD-10-CM

## 2017-05-21 DIAGNOSIS — F439 Reaction to severe stress, unspecified: Secondary | ICD-10-CM | POA: Insufficient documentation

## 2017-05-21 DIAGNOSIS — R001 Bradycardia, unspecified: Secondary | ICD-10-CM | POA: Diagnosis not present

## 2017-05-21 DIAGNOSIS — G8929 Other chronic pain: Secondary | ICD-10-CM | POA: Insufficient documentation

## 2017-05-21 DIAGNOSIS — M25571 Pain in right ankle and joints of right foot: Secondary | ICD-10-CM | POA: Insufficient documentation

## 2017-05-21 DIAGNOSIS — R600 Localized edema: Secondary | ICD-10-CM | POA: Diagnosis present

## 2017-05-21 DIAGNOSIS — M791 Myalgia, unspecified site: Secondary | ICD-10-CM | POA: Insufficient documentation

## 2017-05-21 DIAGNOSIS — R252 Cramp and spasm: Secondary | ICD-10-CM | POA: Diagnosis not present

## 2017-05-21 DIAGNOSIS — M5442 Lumbago with sciatica, left side: Secondary | ICD-10-CM | POA: Insufficient documentation

## 2017-05-21 LAB — CBC
HEMATOCRIT: 36.8 % (ref 35.0–47.0)
Hemoglobin: 12 g/dL (ref 12.0–16.0)
MCH: 28.3 pg (ref 26.0–34.0)
MCHC: 32.7 g/dL (ref 32.0–36.0)
MCV: 86.7 fL (ref 80.0–100.0)
PLATELETS: 249 10*3/uL (ref 150–440)
RBC: 4.25 MIL/uL (ref 3.80–5.20)
RDW: 14.7 % — AB (ref 11.5–14.5)
WBC: 6.9 10*3/uL (ref 3.6–11.0)

## 2017-05-21 LAB — COMPREHENSIVE METABOLIC PANEL
ALT: 13 U/L — ABNORMAL LOW (ref 14–54)
ANION GAP: 10 (ref 5–15)
AST: 18 U/L (ref 15–41)
Albumin: 4.1 g/dL (ref 3.5–5.0)
Alkaline Phosphatase: 56 U/L (ref 38–126)
BILIRUBIN TOTAL: 0.2 mg/dL — AB (ref 0.3–1.2)
BUN: 21 mg/dL — AB (ref 6–20)
CHLORIDE: 107 mmol/L (ref 101–111)
CO2: 25 mmol/L (ref 22–32)
Calcium: 9.3 mg/dL (ref 8.9–10.3)
Creatinine, Ser: 1.16 mg/dL — ABNORMAL HIGH (ref 0.44–1.00)
GFR, EST NON AFRICAN AMERICAN: 52 mL/min — AB (ref >60)
Glucose, Bld: 92 mg/dL (ref 65–99)
POTASSIUM: 3.8 mmol/L (ref 3.5–5.1)
Sodium: 142 mmol/L (ref 135–145)
TOTAL PROTEIN: 7.2 g/dL (ref 6.5–8.1)

## 2017-05-21 LAB — MAGNESIUM: MAGNESIUM: 2 mg/dL (ref 1.7–2.4)

## 2017-05-21 LAB — TSH: TSH: 1.924 u[IU]/mL (ref 0.350–4.500)

## 2017-05-21 LAB — BRAIN NATRIURETIC PEPTIDE: B NATRIURETIC PEPTIDE 5: 29 pg/mL (ref 0.0–100.0)

## 2017-05-21 NOTE — Discharge Instructions (Signed)
Please return to the emergency department if you develop chest pain, shortness of breath, palpitations, lightheadedness or fainting, or any other symptoms concerning to you.

## 2017-05-21 NOTE — ED Triage Notes (Signed)
States bilateral leg cramps and hand pain for 6 months, states pain has been getting worse, states increased swelling in her ankles as well, pt awake and alert in no acute distress

## 2017-05-21 NOTE — ED Notes (Signed)
"  I have been waking up with leg cramps at nights that are bad - I have swelling in both of my legs.  I noticed while I was waiting that I was having some palpitations.  My legs are sore and I will have trouble getting up after a cramp - I notice the pain is lingering now."

## 2017-05-21 NOTE — ED Provider Notes (Addendum)
[facility] Emergency Department Provider Note  ____________________________________________  Time seen: Approximately 2:56 PM  I have reviewed the triage vital signs and the nursing notes.   HISTORY  Chief Complaint Leg Pain and Leg Swelling    HPI Carolyn Gregory is a 56 y.o. female with a history of hypertension presenting for bilateral lower extremity swelling, bilateral calf cramping, and palpitations.  The patient reports that she first started noticing symmetric swelling 11 months ago at the beginning of the year.  Initially, she had associated infrequent bilateral calf cramps.  Over the past several weeks, the patient reports that her calf cramps have become more frequent, now occurring several times per week.  Initially they would only happen at night but now she is experiencing them during the day.  They generally last for 1 minute.  She denies any chest pain or shortness of breath.  She has also had significant amount of life stressors, and occasionally has associated palpitations without lightheadedness or syncope for the past 2 weeks.  The patient does not take any cholesterol medicine.  She has had issues with hypokalemia from diuretics in the past and is no longer on a diuretic at this time.  Past Medical History:  Diagnosis Date  . Anxiety   . Depression   . Hypertension     There are no active problems to display for this patient.   Past Surgical History:  Procedure Laterality Date  . CHOLECYSTECTOMY    . MOHS SURGERY     to remove benign growth  located in the L low abdomen  . R ankle surgery   2016   to repair lateral tendon   . VAGINAL HYSTERECTOMY      Current Outpatient Rx  . Order #: [id number] Class: Historical Med  . Order #: [id number] Class: Historical Med  . Order #: [id number] Class: Historical Med  . Order #: [id number] Class: Historical Med  . Order #: [id number] Class: Historical Med    Allergies Codeine and Hctz  [hydrochlorothiazide]  Family History  Problem Relation Age of Onset  . Diabetes Mother     Social History Social History   Tobacco Use  . Smoking status: Never Smoker  . Smokeless tobacco: Never Used  Substance Use Topics  . Alcohol use: No  . Drug use: No    Review of Systems Constitutional: No fever/chills.  Lightheadedness or syncope.  Eyes: No visual changes. ENT: No sore throat. No congestion or rhinorrhea. Cardiovascular: Denies chest pain.  Positive palpitations. Respiratory: Denies shortness of breath.  No cough. Gastrointestinal: No abdominal pain.  No nausea, no vomiting.  No diarrhea.  No constipation. Genitourinary: Negative for dysuria. Musculoskeletal: Negative for back pain.  Positive bilateral lower extremity swelling.  Positive bilateral lower extremity cramping. Skin: Negative for rash. Neurological: Negative for headaches. No focal numbness, tingling or weakness.     ____________________________________________   PHYSICAL EXAM:  VITAL SIGNS: ED Triage Vitals  Enc Vitals Group     BP 05/21/17 1053 (!) 144/84     Pulse Rate 05/21/17 1053 (!) 55     Resp 05/21/17 1053 18     Temp 05/21/17 1053 98.8 F (37.1 C)     Temp Source 05/21/17 1053 Oral     SpO2 05/21/17 1053 97 %     Weight 05/21/17 1053 265 lb (120.2 kg)     Height 05/21/17 1053 5\' 7"  (1.702 m)     Head Circumference --      Peak Flow --  Pain Score 05/21/17 1052 6     Pain Loc --      Pain Edu? --      Excl. in GC? --     Constitutional: Alert and oriented. Well appearing and in no acute distress. Answers questions appropriately. Eyes: Conjunctivae are normal.  EOMI. No scleral icterus. Head: Atraumatic. Nose: No congestion/rhinnorhea. Mouth/Throat: Mucous membranes are moist.  Neck: No stridor.  Supple.  No JVD.  No meningismus. Cardiovascular: Normal rate, regular rhythm. No murmurs, rubs or gallops.  Respiratory: Normal respiratory effort.  No accessory muscle use or  retractions. Lungs CTAB.  No wheezes, rales or ronchi. Gastrointestinal: Soft, nontender and nondistended.  No guarding or rebound.  No peritoneal signs. Musculoskeletal: Positive bilateral pitting LE edema to the distal tibial shaft.  Positive bilateral Ttp in the calves without palpable cords.  Negative Homan's sign.   Neurologic:  A&Ox3.  Speech is clear.  Face and smile are symmetric.  EOMI.  Moves all extremities well. Skin:  Skin is warm, dry and intact. No rash noted. Psychiatric: Mood and affect are normal. Speech and behavior are normal.  Normal judgement.  ____________________________________________   LABS (all labs ordered are listed, but only abnormal results are displayed)  Labs Reviewed  CBC - Abnormal; Notable for the following components:      Result Value   RDW 14.7 (*)    All other components within normal limits  COMPREHENSIVE METABOLIC PANEL - Abnormal; Notable for the following components:   BUN 21 (*)    Creatinine, Ser 1.16 (*)    ALT 13 (*)    Total Bilirubin 0.2 (*)    GFR calc non Af Amer 52 (*)    All other components within normal limits  BRAIN NATRIURETIC PEPTIDE  MAGNESIUM  TSH   ____________________________________________  EKG  ED ECG REPORT I, Rockne MenghiniNorman, Anne-Caroline, the attending physician, personally viewed and interpreted this ECG.   Date: 05/21/2017  EKG Time: 1512  Rate: 50  Rhythm: sinus bradycardia  Axis: leftward  Intervals:none  ST&T Change: No STEMI  ____________________________________________  RADIOLOGY  Dg Chest 2 View  Result Date: 05/21/2017 CLINICAL DATA:  Cardiac palpitations EXAM: CHEST  2 VIEW COMPARISON:  None. FINDINGS: The heart size and mediastinal contours are within normal limits. Both lungs are clear. The visualized skeletal structures are unremarkable. IMPRESSION: No active cardiopulmonary disease. Electronically Signed   By: Alcide CleverMark  Lukens M.D.   On: 05/21/2017 15:43   [place] Venous Img Lower  Bilateral  Result Date: 05/21/2017 CLINICAL DATA:  Bilateral lower extremity pain, edema and cramping. EXAM: BILATERAL LOWER EXTREMITY VENOUS DOPPLER ULTRASOUND TECHNIQUE: Gray-scale sonography with graded compression, as well as color Doppler and duplex ultrasound were performed to evaluate the lower extremity deep venous systems from the level of the common femoral vein and including the common femoral, femoral, profunda femoral, popliteal and calf veins including the posterior tibial, peroneal and gastrocnemius veins when visible. The superficial great saphenous vein was also interrogated. Spectral Doppler was utilized to evaluate flow at rest and with distal augmentation maneuvers in the common femoral, femoral and popliteal veins. COMPARISON:  None. FINDINGS: RIGHT LOWER EXTREMITY Common Femoral Vein: No evidence of thrombus. Normal compressibility, respiratory phasicity and response to augmentation. Saphenofemoral Junction: No evidence of thrombus. Normal compressibility and flow on color Doppler imaging. Profunda Femoral Vein: No evidence of thrombus. Normal compressibility and flow on color Doppler imaging. Femoral Vein: No evidence of thrombus. Normal compressibility, respiratory phasicity and response to augmentation. Popliteal Vein: No  evidence of thrombus. Normal compressibility, respiratory phasicity and response to augmentation. Calf Veins: No evidence of thrombus. Normal compressibility and flow on color Doppler imaging. Superficial Great Saphenous Vein: No evidence of thrombus. Normal compressibility. Venous Reflux:  None. Other Findings: No evidence of superficial thrombophlebitis or abnormal fluid collection. LEFT LOWER EXTREMITY Common Femoral Vein: No evidence of thrombus. Normal compressibility, respiratory phasicity and response to augmentation. Saphenofemoral Junction: No evidence of thrombus. Normal compressibility and flow on color Doppler imaging. Profunda Femoral Vein: No evidence of  thrombus. Normal compressibility and flow on color Doppler imaging. Femoral Vein: No evidence of thrombus. Normal compressibility, respiratory phasicity and response to augmentation. Popliteal Vein: No evidence of thrombus. Normal compressibility, respiratory phasicity and response to augmentation. Calf Veins: No evidence of thrombus. Normal compressibility and flow on color Doppler imaging. Superficial Great Saphenous Vein: No evidence of thrombus. Normal compressibility. Venous Reflux:  None. Other Findings: No evidence of superficial thrombophlebitis or abnormal fluid collection. IMPRESSION: No evidence of bilateral lower extremity deep venous thrombosis. Electronically Signed   By: Irish Lack M.D.   On: 05/21/2017 11:54    ____________________________________________   PROCEDURES  Procedure(s) performed: None  Procedures  Critical Care performed: No ____________________________________________   INITIAL IMPRESSION / ASSESSMENT AND PLAN / ED COURSE  Pertinent labs & imaging results that were available during my care of the patient were reviewed by me and considered in my medical decision making (see chart for details).  56 y.o. female with a history of hypertension presenting with bilateral lower extremity edema that is symmetric, calf pain, and palpitations.  Overall, the patient is hemodynamically stable.  She has some mild renal insufficiency and I do not have a baseline for her; this may be causing her lower extremity edema.  Her bilateral lower extremity ultrasounds are negative for DVT.  We will also get a BNP to evaluate for congestive heart failure although the patient has no signs of pulmonary edema or chest pain.  For the patient's palpitations, it is possible that her due to her social stressors, but will get a TSH as well.  Plan reevaluation for final disposition.  ----------------------------------------- 3:53 PM on  05/21/2017 -----------------------------------------  The patient's workup in the emergency department is reassuring.  She does have some sinus bradycardia, but it is unlikely that this is causing her palpitations, as she is not having any lightheadedness, postural orthostasis, and she does not have chest pain or shortness of breath.  I have told her about this finding and she will follow-up with her primary care physician for further evaluation.  Her BNP, creatinine and ultrasound studies are negative, so these do not explain her lower extremity swelling.  At this time, the patient is safe for discharge.  I have discussed return precautions as well as follow-up instructions with her.  ____________________________________________  FINAL CLINICAL IMPRESSION(S) / ED DIAGNOSES  Final diagnoses:  Bilateral edema of lower extremity  Leg cramps  Palpitations  Situational stress  Sinus bradycardia         NEW MEDICATIONS STARTED DURING THIS VISIT:  This SmartLink is deprecated. Use AVSMEDLIST instead to display the medication list for a patient.    Rockne Menghini, MD 05/21/17 1554    Rockne Menghini, MD 05/21/17 [phone]

## 2017-05-22 NOTE — Therapy (Signed)
[facility] [facility] [address] [place], [place], [address] Phone: [phone]   Fax:  [phone]  Patient Details  Name: Carolyn BosworthJennifer D Gregory MRN: [id number] Date of Birth: 1961/03/24 Referring Provider:  Yvette RackHoward, Athena Auvil, MD  Encounter Date: 05/21/2017  Note   S: Pt reports she has been mindful with doing household chores and community activities. Pt has been sore in the back after activites but she is able to comtinue to do things the next day. Pt would like when muscles are sore versus of if she suffered reinjuries herself.  Radiating pain is now occuring less often and is at the back of the thigh above knee.  The inner groin pain is much much better.    Pt is concerned about the numbness/tingling in her hands and the cramping in her legs have occurred more across the past week but had been an issue since Mar-June 2018. Pt has put on a new medication for blood pressure a while back. Pt is thinking of going to the ER.  The leg cramp is more in the R and hurts with walking and interrupts her sleep . This started in June and has gotten progressively worst. Within the past 3 weeks, it has become excruciating.    O:  Increased tightness with tightness at R calf > L   A:  Deferred PT  P: PT deferred Tx today due to pt's c/o worsening of leg cramp that occurs with walking and interrupting her sleep. PT escorted pt in a W/C to ER and left pt at the front desk while she was checking in. PT tried phoning PCP Curlene DolphinAthena Howard to provide updates but phone line was busy. PT explained to pt to f/u with PCP with findings. Plan to communicate with PCP and change POC as needed if MD recommends any precautions.contraindications/withholding from PT.  Pt voiced understanding and agreed she will f/u with PCP.   Mariane MastersYeung,Shin Yiing ,PT, DPT, E-RYT  05/22/2017, 11:56 AM  [place] [facility] [address] , [place], [address] Phone: [phone]   Fax:  [phone]

## 2017-06-07 ENCOUNTER — Ambulatory Visit: Payer: Non-veteran care | Admitting: Physical Therapy

## 2017-06-07 DIAGNOSIS — M25571 Pain in right ankle and joints of right foot: Secondary | ICD-10-CM | POA: Diagnosis present

## 2017-06-07 DIAGNOSIS — M25572 Pain in left ankle and joints of left foot: Secondary | ICD-10-CM | POA: Diagnosis present

## 2017-06-07 DIAGNOSIS — M5442 Lumbago with sciatica, left side: Secondary | ICD-10-CM | POA: Diagnosis present

## 2017-06-07 DIAGNOSIS — G8929 Other chronic pain: Secondary | ICD-10-CM | POA: Diagnosis present

## 2017-06-07 DIAGNOSIS — M791 Myalgia, unspecified site: Secondary | ICD-10-CM | POA: Diagnosis present

## 2017-06-07 NOTE — Patient Instructions (Addendum)
Minisquat:  Feet in a "v" more than shoudler width apart.  Scoot buttocks back slight, hinge like you are looking at your reflection on a pond  Knees behind toes,  Inhale to "smell flowers"  Exhale on the rise "like rocket"  Do not lock knees, have more weight across ballmounds of feet, toes relaxed   10 reps x 3 x day   And with reaching into low drawers and picking thing up from floor    ________   Westley [place] Shell 45 Degrees   Lying with hips and knees bent 45, one pillow between knees and ankles. Lift knee with exhale. Be sure pelvis does not roll backward. Do not arch back. Do 10 times, each leg, 2 times per day.  http://ss.exer.us/75   Copyright  VHI. All rights reserved.    ___  Weight shift on R/L leg and then find center,  Knees slightly bent weigth on all four points of foot

## 2017-06-07 NOTE — Therapy (Addendum)
[place] MAIN [facility] SERVICES [address] [place], [place], [address] Phone: [phone]   Fax:  [phone]  Physical Therapy Treatment [facility] Note  Patient Details  Name: Carolyn Gregory MRN: [id number] Date of Birth: [birth year] Referring Provider: Marsh Dolly, MD   Encounter Date: 06/07/2017  PT End of Session - 06/07/17 1208    Visit Number  8    Number of Visits  12    Date for PT Re-Evaluation  05/21/17    Authorization Type  g code 8    PT Start Time  0910    PT Stop Time  1005    PT Time Calculation (min)  55 min    Activity Tolerance  Patient tolerated treatment well;No increased pain    Behavior During Therapy  WFL for tasks assessed/performed       Past Medical History:  Diagnosis Date  . Anxiety   . Depression   . Hypertension     Past Surgical History:  Procedure Laterality Date  . CHOLECYSTECTOMY    . MOHS SURGERY     to remove benign growth  located in the L low abdomen  . R ankle surgery   2016   to repair lateral tendon   . VAGINAL HYSTERECTOMY      There were no vitals filed for this visit.  Subjective Assessment - 06/07/17 0911    Subjective  Pt reports she was able to cook Thanksgiving meal for her son. She notices she can do it for 30 min before needing a break but it is an improvement. Pt reports  stiffness and a lot of pain in the L upper glut and hip after sitting for sitting 30 mins. This pain does not travel down the leg for the past 2 weeks.  The r radaiting pain is minimal.  Pt went to the  ER for leg cramps 2 weeks ago and the testes were neg for clots. Her PCP prescribed her magnesium and changed her BP medications. Since then, she has not had any leg cramps.                     Pelvic Floor Special Questions - 06/07/17 0926    Pelvic Floor Internal Exam  pt was explained the details of the exam and pt consented verbally without contraindications .     Exam Type  Vaginal    Palpation  tensions at B obt int internally , L sacroccygeal area internall and external    Strength  good squeeze, good lift, able to hold agaisnt strong resistance    Strength # of reps  -- increased lengthening of posterior mm         [facility] Adult PT Treatment/Exercise - 06/07/17 1505      Exercises   Exercises  -- see pt instructions      Manual Therapy   Manual therapy comments  inferior/superior mob of sacrum to faciliate extension/ flexion of coccyx  STM/ MWM at sacrococcgyeus ligament/ coccgeus towards ischia    Internal Pelvic Floor  gentle thiele massage at B obt int (internally) / sacrococcgyeus area (internally and externally)              PT Education - 06/07/17 1511    Education provided  Yes    Education Details  HEP    Person(s) Educated  Patient    Methods  Explanation;Demonstration;Tactile cues;Verbal cues;Handout    Comprehension  Returned demonstration;Verbalized understanding  PT Long Term Goals - 06/07/17 0907      PT LONG TERM GOAL #1   Title  Pt will decrease her ODI score from 48% to < 38% in order to return to ADLs ( 10/30: 46%, 11/30: 38%)     Time  12    Period  Weeks    Status  Achieved      PT LONG TERM GOAL #2   Title  Pt will decrease her LEFS score from   25% to > 30%  in order to walk and have less pain at her R ankle.  (11/30: 26%)     Time  12    Period  Weeks    Status  Partially Met      PT LONG TERM GOAL #3   Title  Pt will demo less B ankle instability, improved gait in order to minimize risk for injuries and falls     Time  12    Period  Weeks    Status  Partially Met      PT LONG TERM GOAL #4   Title  Pt will demo no pelvic obliquities and normal arthrokinematic motion of SIJ in order to minimize radiating pain to walk longer distances    Time  4    Period  Weeks    Status  Achieved      PT LONG TERM GOAL #5   Title  Pt will demo no pelvic floor mm tensions and proper ROM in order to eliminate urine and  restore pelvic floor function    Time  12    Period  Weeks    Status  Achieved      Additional Long Term Goals   Additional Long Term Goals  Yes      PT LONG TERM GOAL #6   Title  Pt will demo decreased scar restrictions over abdomen, IND with abdominal massage, and report more regular bowel movements of 1-2 days in order to restore GI health    Time  12    Period  Weeks    Status  Achieved      PT LONG TERM GOAL #7   Title  Pt will demo no tensions/ tenderness at L sacrococcygeus ligament and coccygeus mm in order to sit for > 30 min and have no pain upon standing    Time  4    Period  Weeks    Status  New    Target Date  07/05/17            Plan - 06/07/17 1511    Clinical Impression Statement Pt has achieved 4/7 goals and is progressing well towards her remaining goals.  Pt continues to make positive changes with increased ability/ endurance to cook for family. Pt showed signficantly decreased pelvic floor mm tensions/tenderness. Focused on decreasing posterior pelvic floor mm and mobility of coccyx which decreased in mm tensions / tenderness and increased mobility of sacrum/coccyx post Tx. Pt tolerated Tx without complaints. Pt showed improved lengthening and relaxation of posterior pelvic floor mm wiht cues for co-activation of feet muscles.  Plan to initate regional interdependent approach to strengthen ankles/lower kinetic chain given pt's Hx of B ankle instabilty. This approach towards Tx will help make longer lasting benefits to lumbopelvic dsyfunctions. Pt continues to benefit fomr skilled PT.      Rehab Potential  Good    PT Frequency  1x / week    PT Duration  12 weeks  PT Treatment/Interventions  Functional mobility training;Stair training;Gait training;Patient/family education;Aquatic Therapy;Therapeutic activities;Manual lymph drainage;Moist Heat;Neuromuscular re-education;Therapeutic exercise;Manual techniques;Taping;Energy conservation    Consulted and Agree with  Plan of Care  Patient       Patient will benefit from skilled therapeutic intervention in order to improve the following deficits and impairments:  Abnormal gait, Pain, Increased fascial restricitons, Decreased strength, Decreased scar mobility, Increased muscle spasms, Difficulty walking, Decreased mobility, Decreased range of motion, Decreased balance, Decreased safety awareness, Decreased coordination, Postural dysfunction, Improper body mechanics, Decreased endurance  Visit Diagnosis: Chronic bilateral low back pain with left-sided sciatica  Myalgia  Pain in left ankle and joints of left foot  Pain in right ankle and joints of right foot     Problem List There are no active problems to display for this patient.   [name] ,PT, DPT, E-RYT  06/07/2017, 3:18 PM  [place] MAIN [facility] SERVICES [address] [place], [place], [address] Phone: [phone]   Fax:  [phone]  Name: KALEIYAH POLSKY MRN: [id number] Date of Birth: [birth year]

## 2017-06-18 ENCOUNTER — Ambulatory Visit: Payer: Non-veteran care | Admitting: Physical Therapy

## 2017-06-25 ENCOUNTER — Emergency Department
Admission: EM | Admit: 2017-06-25 | Discharge: 2017-06-26 | Disposition: A | Discharge status: Home / Self Care | Payer: Non-veteran care | Attending: Emergency Medicine | Admitting: Emergency Medicine

## 2017-06-25 ENCOUNTER — Other Ambulatory Visit: Payer: Self-pay

## 2017-06-25 ENCOUNTER — Encounter: Payer: Self-pay | Admitting: Emergency Medicine

## 2017-06-25 DIAGNOSIS — K2900 Acute gastritis without bleeding: Secondary | ICD-10-CM | POA: Diagnosis not present

## 2017-06-25 DIAGNOSIS — Z79899 Other long term (current) drug therapy: Secondary | ICD-10-CM | POA: Insufficient documentation

## 2017-06-25 DIAGNOSIS — F418 Other specified anxiety disorders: Secondary | ICD-10-CM | POA: Insufficient documentation

## 2017-06-25 DIAGNOSIS — I10 Essential (primary) hypertension: Secondary | ICD-10-CM | POA: Diagnosis not present

## 2017-06-25 DIAGNOSIS — R197 Diarrhea, unspecified: Secondary | ICD-10-CM

## 2017-06-25 DIAGNOSIS — R112 Nausea with vomiting, unspecified: Secondary | ICD-10-CM | POA: Diagnosis not present

## 2017-06-25 DIAGNOSIS — K529 Noninfective gastroenteritis and colitis, unspecified: Secondary | ICD-10-CM

## 2017-06-25 LAB — COMPREHENSIVE METABOLIC PANEL
ALBUMIN: 4.2 g/dL (ref 3.5–5.0)
ALK PHOS: 69 U/L (ref 38–126)
ALT: 13 U/L — AB (ref 14–54)
AST: 19 U/L (ref 15–41)
Anion gap: 8 (ref 5–15)
BILIRUBIN TOTAL: 0.9 mg/dL (ref 0.3–1.2)
BUN: 16 mg/dL (ref 6–20)
CALCIUM: 9.2 mg/dL (ref 8.9–10.3)
CO2: 26 mmol/L (ref 22–32)
CREATININE: 1.14 mg/dL — AB (ref 0.44–1.00)
Chloride: 105 mmol/L (ref 101–111)
GFR calc Af Amer: >60 mL/min (ref >60)
GFR, EST NON AFRICAN AMERICAN: 54 mL/min — AB (ref >60)
GLUCOSE: 111 mg/dL — AB (ref 65–99)
POTASSIUM: 3.3 mmol/L — AB (ref 3.5–5.1)
Sodium: 139 mmol/L (ref 135–145)
TOTAL PROTEIN: 7.3 g/dL (ref 6.5–8.1)

## 2017-06-25 LAB — URINALYSIS, COMPLETE (UACMP) WITH MICROSCOPIC
Bilirubin Urine: NEGATIVE
Glucose, UA: NEGATIVE mg/dL
Ketones, ur: 5 mg/dL — AB
Leukocytes, UA: NEGATIVE
Nitrite: NEGATIVE
PH: 5 (ref 5.0–8.0)
Protein, ur: 30 mg/dL — AB
SPECIFIC GRAVITY, URINE: 1.021 (ref 1.005–1.030)

## 2017-06-25 LAB — CBC
HEMATOCRIT: 41.2 % (ref 35.0–47.0)
Hemoglobin: 13.5 g/dL (ref 12.0–16.0)
MCH: 28.2 pg (ref 26.0–34.0)
MCHC: 32.9 g/dL (ref 32.0–36.0)
MCV: 85.8 fL (ref 80.0–100.0)
PLATELETS: 228 10*3/uL (ref 150–440)
RBC: 4.8 MIL/uL (ref 3.80–5.20)
RDW: 14.6 % — AB (ref 11.5–14.5)
WBC: 7.6 10*3/uL (ref 3.6–11.0)

## 2017-06-25 LAB — TROPONIN I

## 2017-06-25 LAB — LIPASE, BLOOD: Lipase: 14 U/L (ref 11–51)

## 2017-06-25 MED ORDER — ONDANSETRON 4 MG PO TBDP
4.0000 mg | ORAL_TABLET | Freq: Once | ORAL | Status: AC | PRN
  Administered 2017-06-25: 4 mg via ORAL
  Filled 2017-06-25: qty 1

## 2017-06-25 MED ORDER — SODIUM CHLORIDE 0.9 % IV BOLUS (SEPSIS)
1000.0000 mL | Freq: Once | INTRAVENOUS | Status: AC
  Administered 2017-06-25: 1000 mL via INTRAVENOUS

## 2017-06-25 MED ORDER — ONDANSETRON HCL 4 MG/2ML IJ SOLN
4.0000 mg | Freq: Once | INTRAMUSCULAR | Status: AC
  Administered 2017-06-25: 4 mg via INTRAVENOUS
  Filled 2017-06-25: qty 2

## 2017-06-25 MED ORDER — ONDANSETRON 4 MG PO TBDP: 4.0000 mg | ORAL_TABLET | Freq: Three times a day (TID) | ORAL | 0 refills | Status: DC | PRN

## 2017-06-25 MED ORDER — IOPAMIDOL (ISOVUE-300) INJECTION 61%
30.0000 mL | Freq: Once | INTRAVENOUS | Status: AC | PRN
  Administered 2017-06-25: 30 mL via ORAL

## 2017-06-25 MED ORDER — ACETAMINOPHEN 325 MG PO TABS
650.0000 mg | ORAL_TABLET | Freq: Once | ORAL | Status: AC | PRN
  Administered 2017-06-25: 650 mg via ORAL
  Filled 2017-06-25: qty 2

## 2017-06-25 MED ORDER — FENTANYL CITRATE (PF) 100 MCG/2ML IJ SOLN
50.0000 ug | Freq: Once | INTRAMUSCULAR | Status: AC
  Administered 2017-06-25: 50 ug via INTRAVENOUS
  Filled 2017-06-25: qty 2

## 2017-06-25 NOTE — ED Provider Notes (Signed)
[facility] Emergency Department Provider Note  Time seen: 11:09 PM  I have reviewed the triage vital signs and the nursing notes.   HISTORY  Chief Complaint Emesis and Diarrhea    HPI Carolyn Gregory is a 56 y.o. female with a past medical history of depression, anxiety, hypertension presents to the emergency department with fever, nausea vomiting and diarrhea.  According to the patient since this morning she has been nauseated with frequent episodes of vomiting as well as diarrhea often occurring simultaneously per patient.  She also states chills today and was found to have a low-grade fever 100.6 in the emergency department.  Patient states mild to moderate lower abdominal discomfort especially in the left lower quadrant.  Otherwise largely negative review of systems.  Describes her abdominal discomfort as moderate dull pain currently.   Past Medical History:  Diagnosis Date  . Anxiety   . Depression   . Hypertension     There are no active problems to display for this patient.   Past Surgical History:  Procedure Laterality Date  . CHOLECYSTECTOMY    . MOHS SURGERY     to remove benign growth  located in the L low abdomen  . R ankle surgery   2016   to repair lateral tendon   . VAGINAL HYSTERECTOMY      Prior to Admission medications   Medication Sig Start Date End Date Taking? Authorizing Provider  cholecalciferol (VITAMIN D) 400 units TABS tablet Take 1,000 Units by mouth.    [provider]  gabapentin (NEURONTIN) 100 MG capsule Take 100 mg by mouth 3 (three) times daily.    [provider]  hydrOXYzine (ATARAX/VISTARIL) 25 MG tablet Take 25 mg by mouth 3 (three) times daily as needed.    [provider]  pantoprazole (PROTONIX) 40 MG tablet Take 40 mg by mouth daily.    [provider]  triamterene-hydrochlorothiazide (DYAZIDE) 37.5-25 MG capsule Take 1 capsule by mouth daily.    [provider]     Allergies  Allergen Reactions  . Codeine Other (See Comments)  . Hctz [Hydrochlorothiazide] Other (See Comments)    Causes her potassium to drop    Family History  Problem Relation Age of Onset  . Diabetes Mother     Social History Social History   Tobacco Use  . Smoking status: Never Smoker  . Smokeless tobacco: Never Used  Substance Use Topics  . Alcohol use: No  . Drug use: No    Review of Systems Constitutional: Low-grade fever. Cardiovascular: Negative for chest pain. Respiratory: Negative for shortness of breath. Gastrointestinal: Left lower quadrant abdominal discomfort.  Positive for nausea vomiting diarrhea. Genitourinary: Negative for dysuria. Neurological: Negative for headache All other ROS negative  ____________________________________________   PHYSICAL EXAM:  VITAL SIGNS: ED Triage Vitals  Enc Vitals Group     BP 06/25/17 2100 (!) 145/89     Pulse Rate 06/25/17 2100 (!) 106     Resp 06/25/17 2100 20     Temp 06/25/17 2100 (!) 100.6 F (38.1 C)     Temp Source 06/25/17 2100 Oral     SpO2 06/25/17 2100 99 %     Weight 06/25/17 2058 235 lb (106.6 kg)     Height 06/25/17 2058 5\' 7"  (1.702 m)     Head Circumference --      Peak Flow --      Pain Score 06/25/17 2056 8     Pain Loc --  Pain Edu? --      Excl. in GC? --    Constitutional: Alert and oriented. Well appearing and in no distress. Eyes: Normal exam ENT   Head: Normocephalic and atraumatic.   Mouth/Throat: Mucous membranes are moist. Cardiovascular: Normal rate, regular rhythm. No murmur Respiratory: Normal respiratory effort without tachypnea nor retractions. Breath sounds are clear  Gastrointestinal: Soft, mild left lower quadrant tenderness to palpation.  Mild epigastric tenderness.  No rebound or guarding.  No distention. Musculoskeletal: Nontender with normal range of motion in all extremities.  Neurologic:  Normal speech and language. No gross focal neurologic  deficits Skin:  Skin is warm, dry and intact.  Psychiatric: Mood and affect are normal.   ____________________________________________    EKG  EKG reviewed and interpreted by myself shows normal sinus rhythm at 96 bpm, narrow QRS, normal axis, normal intervals, nonspecific ST changes.  ____________________________________________    RADIOLOGY  CT pending  ____________________________________________   INITIAL IMPRESSION / ASSESSMENT AND PLAN / ED COURSE  Pertinent labs & imaging results that were available during my care of the patient were reviewed by me and considered in my medical decision making (see chart for details).  Patient presents to the emergency department for nausea vomiting diarrhea starting this morning along with mild left lower quadrant abdominal discomfort.  Patient does have a low-grade temperature of 100.6 in the emergency department.  Differential would include gastroenteritis, colitis, diverticulitis, enteritis, biliary disease, urinary tract infection.  Patient's labs overall are reassuring, normal white blood cell count, largely normal chemistry, urinalysis is normal besides a mild amount of ketones consistent with mild dehydration.  Troponin is negative.  Patient does have mild to moderate tenderness to palpation in the left lower quadrant, given her low-grade fever with tenderness to the abdomen we will proceed with a CT scan abdomen/pelvis to further evaluate.  We will treat with Zofran and IV fluids.  CT pending, patient care signed out to Dr. Dolores FrameSung.  ____________________________________________   FINAL CLINICAL IMPRESSION(S) / ED DIAGNOSES  Nausea vomiting diarrhea    Minna AntisPaduchowski, Corianna Avallone, MD 06/25/17 2313

## 2017-06-25 NOTE — ED Triage Notes (Signed)
Patient to ER for c/o emesis and diarrhea all day today, has also had mid lower abd pain and bloating. Has been unable to tolerate crackers or sips of gingerale. Patient had colonoscopy done on 11/20 with polyp removal. States it is more painful to lay on left side. Also reports having chills earlier today, did not take temp.

## 2017-06-25 NOTE — Discharge Instructions (Addendum)
As we discussed please drink plenty of fluids, take your nausea medication as needed, as prescribed.  After 24 hours of diarrhea you may also use over-the-counter Imodium/loperamide as needed for relief.  Start Pepcid twice daily as prescribed.  You may take Bentyl as needed for abdominal discomfort.  Return to the emergency department for any worsening pain, otherwise please follow-up with your doctor in the next several days for recheck/reevaluation.

## 2017-06-25 NOTE — ED Notes (Signed)
First thing this morning pt had sudden diarrhea, chills, and vomiting. She also reports a fever off 100.1 and weakness.

## 2017-06-26 ENCOUNTER — Emergency Department: Payer: Non-veteran care

## 2017-06-26 MED ORDER — FAMOTIDINE 20 MG PO TABS: 20.0000 mg | ORAL_TABLET | Freq: Two times a day (BID) | ORAL | 0 refills | Status: DC

## 2017-06-26 MED ORDER — DICYCLOMINE HCL 20 MG PO TABS: 20.0000 mg | ORAL_TABLET | Freq: Four times a day (QID) | ORAL | 0 refills | Status: DC | PRN

## 2017-06-26 MED ORDER — IOPAMIDOL (ISOVUE-300) INJECTION 61%
100.0000 mL | Freq: Once | INTRAVENOUS | Status: AC | PRN
  Administered 2017-06-26: 100 mL via INTRAVENOUS

## 2017-06-26 NOTE — ED Provider Notes (Signed)
-----------------------------------------   1:40 AM on 06/26/2017 -----------------------------------------  Patient feeling much better, able to tolerate oral contrast without emesis.  Updated patient and spouse of CT imaging results.   CT Abdomen/Pelvis interpreted per Dr. Margarita GrizzleWoodruff: 1. Findings suspicious for a degree of antral and pyloric gastritis.    2. Wall thickening is noted in multiple loops of proximal bowel  consistent with a degree of enteritis. No bowel obstruction. No  abscess. Appendix appears normal.    3. Multiple renal cysts bilaterally with dominant cyst on each side  as noted. No hydronephrosis. No renal or ureteral calculus.    4. Gallbladder absent. Uterus absent.   She currently takes Protonix daily.  Will add twice daily Pepcid.  Will discharge home with prescriptions for Bentyl and Zofran to use as needed.  Strict return precautions given.  Both verbalize understanding and agree with plan of care.   Irean HongSung, Jade J, MD 06/26/17 [phone]

## 2017-07-11 ENCOUNTER — Ambulatory Visit: Payer: Non-veteran care | Attending: Internal Medicine | Admitting: Physical Therapy

## 2017-07-11 DIAGNOSIS — M5442 Lumbago with sciatica, left side: Secondary | ICD-10-CM | POA: Diagnosis present

## 2017-07-11 DIAGNOSIS — G8929 Other chronic pain: Secondary | ICD-10-CM

## 2017-07-11 DIAGNOSIS — M791 Myalgia, unspecified site: Secondary | ICD-10-CM

## 2017-07-11 DIAGNOSIS — M25571 Pain in right ankle and joints of right foot: Secondary | ICD-10-CM | POA: Diagnosis present

## 2017-07-11 DIAGNOSIS — M25572 Pain in left ankle and joints of left foot: Secondary | ICD-10-CM | POA: Insufficient documentation

## 2017-07-11 NOTE — Patient Instructions (Signed)
Feet hip width apart and more push off with back leg when walking   Getting out of chair: like mini squat with knees feet "V"    Modified lunges by counter,  Front knee above ankle, Back knee bend and straighten   10 reps

## 2017-07-11 NOTE — Therapy (Addendum)
[place] MAIN [facility] SERVICES [address] [place], [place], [address] Phone: [phone]   Fax:  [phone]  Physical Therapy Treatment  Patient Details  Name: Carolyn Gregory MRN: [id number] Date of Birth: 03-04-1961 Referring Provider: Marsh Dolly, MD   Encounter Date: 07/11/2017  PT End of Session - 07/11/17 1414    Visit Number  9    Number of Visits  12    Date for PT Re-Evaluation  08/30/17   Authorization Type  g code 9    PT Start Time  3295    PT Stop Time  1503    PT Time Calculation (min)  55 min    Activity Tolerance  Patient tolerated treatment well;No increased pain    Behavior During Therapy  WFL for tasks assessed/performed       Past Medical History:  Diagnosis Date  . Anxiety   . Depression   . Hypertension     Past Surgical History:  Procedure Laterality Date  . CHOLECYSTECTOMY    . MOHS SURGERY     to remove benign growth  located in the L low abdomen  . R ankle surgery   2016   to repair lateral tendon   . VAGINAL HYSTERECTOMY      There were no vitals filed for this visit.  Subjective Assessment - 07/11/17 1412    Subjective  Pt had an ER visit two weeks ago due to a fever, vomitting, and diarrhea, and L mid abdominal pain. Her tests showed gastritis, hernia, and cyst on both kidneys. Pt did not stay at the hospital. Pt will be following up with her PCP re: her issues.  Pt has been feeling better for the past week. Pt's back pain has been better. Pt is concerned that after sitting for 30 min, pt feels the area in her L glut /groin crease feels like it is stuck/ catch and it may give out and is weak.           [facility] PT Assessment - 07/11/17 1458      Palpation   SI assessment   limited hip ext L with limited iliac mobility and tigthness at glut med preTx,  improved post Tx      Ambulation/Gait   Gait Comments  narrow BOS               Pelvic Floor Special Questions - 07/11/17 1457    Pelvic Floor Internal Exam  pt was explained the details of the exam and pt consented verbally without contraindications .     Exam Type  Vaginal    Palpation  no tensions at B obt int internally . slight tensions at R ischiococcgyeus     Strength  good squeeze, good lift, able to hold agaisnt strong resistance        [facility] Adult PT Treatment/Exercise - 07/11/17 1458      Neuro Re-ed    Neuro Re-ed Details   se pt instructions      Manual Therapy   Manual therapy comments  inferior mob/ PA mob Grade III at sacrum L, MWM hip ext ,  distraction with thoracic rotation     Internal Pelvic Floor  thiele massage/ MWM at R ischiococcgeus              PT Education - 07/11/17 1500    Education provided  Yes    Education Details  HEP    Person(s) Educated  Patient  Methods  Explanation;Demonstration;Tactile cues;Verbal cues;Handout    Comprehension  Returned demonstration;Verbalized understanding          PT Long Term Goals - 07/11/17 1415      PT LONG TERM GOAL #1   Title  Pt will decrease her ODI score from 48% to < 38% in order to return to ADLs ( 10/30: 46%, 11/30: 38%)     Time  12    Period  Weeks    Status  Achieved      PT LONG TERM GOAL #2   Title  Pt will decrease her LEFS score from   25% to > 30%  in order to walk and have less pain at her R ankle.  (11/30: 26%)     Time  12    Period  Weeks    Status  Partially Met      PT LONG TERM GOAL #3   Title  Pt will demo less B ankle instability, improved gait in order to minimize risk for injuries and falls     Time  12    Period  Weeks    Status  Partially Met      PT LONG TERM GOAL #4   Title  Pt will demo no pelvic obliquities and normal arthrokinematic motion of SIJ in order to minimize radiating pain to walk longer distances    Time  4    Period  Weeks    Status  Achieved      PT LONG TERM GOAL #5   Title  Pt will demo no pelvic floor mm tensions and proper ROM in order to eliminate urine and restore  pelvic floor function    Time  12    Period  Weeks    Status  Achieved      PT LONG TERM GOAL #6   Title  Pt will demo decreased scar restrictions over abdomen, IND with abdominal massage, and report more regular bowel movements of 1-2 days in order to restore GI health    Time  12    Period  Weeks    Status  Achieved      PT LONG TERM GOAL #7   Title  Pt will demo no tensions/ tenderness at L sacrococcygeus ligament and coccygeus mm in order to sit for > 30 min and have no pain upon standing    Time  4    Period  Weeks    Status  New            Plan - 07/11/17 1505    Clinical Impression Statement Pt demo'd increased L hip extension and improved push up in gait and less pain with sit to stand.  Pt showed minor pelvic floor mm tensions , and thus achieving another goal. Pt continues to progress well towards her 2 remaining goals with skilled PT   Rehab Potential  Good    PT Frequency  1x / week    PT Duration  12 weeks    PT Treatment/Interventions  Functional mobility training;Stair training;Gait training;Patient/family education;Aquatic Therapy;Therapeutic activities;Manual lymph drainage;Moist Heat;Neuromuscular re-education;Therapeutic exercise;Manual techniques;Taping;Energy conservation    Consulted and Agree with Plan of Care  Patient       Patient will benefit from skilled therapeutic intervention in order to improve the following deficits and impairments:  Abnormal gait, Pain, Increased fascial restricitons, Decreased strength, Decreased scar mobility, Increased muscle spasms, Difficulty walking, Decreased mobility, Decreased range of motion, Decreased balance, Decreased safety awareness, Decreased coordination,  Postural dysfunction, Improper body mechanics, Decreased endurance  Visit Diagnosis: Chronic bilateral low back pain with left-sided sciatica  Myalgia  Pain in left ankle and joints of left foot  Pain in right ankle and joints of right foot     Problem  List There are no active problems to display for this patient.   [name] 07/11/2017, 3:08 PM  [place] MAIN [facility] SERVICES [address] [place], [place], [address] Phone: [phone]   Fax:  [phone]  Name: Carolyn Gregory MRN: [id number] Date of Birth: 1960/12/20

## 2017-07-11 NOTE — Addendum Note (Signed)
Addended by: Mariane MastersYEUNG, SHIN-YIING on: 07/11/2017 03:53 PM   Modules accepted: Orders

## 2017-07-25 ENCOUNTER — Ambulatory Visit: Payer: Non-veteran care | Admitting: Physical Therapy

## 2017-07-25 DIAGNOSIS — M25572 Pain in left ankle and joints of left foot: Secondary | ICD-10-CM

## 2017-07-25 DIAGNOSIS — M5442 Lumbago with sciatica, left side: Principal | ICD-10-CM

## 2017-07-25 DIAGNOSIS — M25571 Pain in right ankle and joints of right foot: Secondary | ICD-10-CM

## 2017-07-25 DIAGNOSIS — M791 Myalgia, unspecified site: Secondary | ICD-10-CM

## 2017-07-25 DIAGNOSIS — G8929 Other chronic pain: Secondary | ICD-10-CM

## 2017-07-25 NOTE — Therapy (Signed)
[place] MAIN [facility] SERVICES [address] [place], [place], [address] Phone: [phone]   Fax:  [phone]  Physical Therapy Treatment  Patient Details  Name: Carolyn Gregory MRN: [id number] Date of Birth: April 22, 1961 Referring Provider: Timmie Foerster    Encounter Date: 07/25/2017  PT End of Session - 07/25/17 1017    Visit Number  10    Number of Visits  12    Date for PT Re-Evaluation  08/30/17    Authorization Type  g code 9    PT Start Time  0908    PT Stop Time  1010    PT Time Calculation (min)  62 min    Activity Tolerance  Patient tolerated treatment well;No increased pain    Behavior During Therapy  WFL for tasks assessed/performed       Past Medical History:  Diagnosis Date  . Anxiety   . Depression   . Hypertension     Past Surgical History:  Procedure Laterality Date  . CHOLECYSTECTOMY    . MOHS SURGERY     to remove benign growth  located in the L low abdomen  . R ankle surgery   2016   to repair lateral tendon   . VAGINAL HYSTERECTOMY      There were no vitals filed for this visit.  Subjective Assessment - 07/25/17 0911    Subjective  Pt reports no increased pain after last session. Pt feels the L groin area to be annoying and painful with lifting knee up and crossing ankle over R thigh to stretch. It affects her gait after doing this motion.          [facility] PT Assessment - 07/25/17 0913      Strength   Overall Strength Comments  hip flex L 4-/5, R 5/5                   [facility] Adult PT Treatment/Exercise - 07/25/17 1008      Neuro Re-ed    Neuro Re-ed Details   se pt instructions      Modalities   Modalities  -- heat, posterior gluts      Manual Therapy   Manual therapy comments  long axis distraction on LE, PA mobs Grade III at L SIJ, STM at pirifomris mm, rotational mobs                   PT Long Term Goals - 07/25/17 1005      PT LONG TERM GOAL #1   Title  Pt  will decrease her ODI score from 48% to < 38% in order to return to ADLs ( 10/30: 46%, 11/30: 38%)     Time  12    Period  Weeks    Status  Achieved      PT LONG TERM GOAL #2   Title  Pt will decrease her LEFS score from   25% to > 30%  in order to walk and have less pain at her R ankle.  (11/30: 26%)     Time  12    Period  Weeks    Status  Partially Met      PT LONG TERM GOAL #3   Title  Pt will demo less B ankle instability, improved gait in order to minimize risk for injuries and falls     Time  12    Period  Weeks    Status  Partially Met  PT LONG TERM GOAL #4   Title  Pt will demo no pelvic obliquities and normal arthrokinematic motion of SIJ in order to minimize radiating pain to walk longer distances    Time  4    Period  Weeks    Status  Achieved      PT LONG TERM GOAL #5   Title  Pt will demo no pelvic floor mm tensions and proper ROM in order to eliminate urine and restore pelvic floor function    Time  12    Period  Weeks    Status  Achieved      PT LONG TERM GOAL #6   Title  Pt will demo decreased scar restrictions over abdomen, IND with abdominal massage, and report more regular bowel movements of 1-2 days in order to restore GI health    Time  12    Period  Weeks    Status  Achieved      PT LONG TERM GOAL #7   Title  Pt will demo no tensions/ tenderness at L sacrococcygeus ligament and coccygeus mm in order to sit for > 30 min and have no pain upon standing    Time  4    Period  Weeks    Status  Achieved      PT LONG TERM GOAL #8   Title  Pt will report no pain 100% of the time with crossing L ankle over R thigh and then getting up from chair  across 1 week in order to don shoes.     Time  4    Period  Weeks    Status  New    Target Date  08/22/17            Plan - 07/25/17 1004    Clinical Impression Statement  Pt showed signficantly decreased tensions at L SIJ and reported no pain with crossing her L leg over R knee compared to pre Tx.   Pain decreased from 8/10 to 0/10 with getting out of chair and crossing L leg over R thigh. Resistance training was added to strengthen L hip flexor mm. Pt continues to benefit from skilled PT.     Rehab Potential  Good    PT Frequency  1x / week    PT Duration  12 weeks    PT Treatment/Interventions  Functional mobility training;Stair training;Gait training;Patient/family education;Aquatic Therapy;Therapeutic activities;Manual lymph drainage;Moist Heat;Neuromuscular re-education;Therapeutic exercise;Manual techniques;Taping;Energy conservation    Consulted and Agree with Plan of Care  Patient       Patient will benefit from skilled therapeutic intervention in order to improve the following deficits and impairments:  Abnormal gait, Pain, Increased fascial restricitons, Decreased strength, Decreased scar mobility, Increased muscle spasms, Difficulty walking, Decreased mobility, Decreased range of motion, Decreased balance, Decreased safety awareness, Decreased coordination, Postural dysfunction, Improper body mechanics, Decreased endurance  Visit Diagnosis: Chronic bilateral low back pain with left-sided sciatica  Myalgia  Pain in left ankle and joints of left foot  Pain in right ankle and joints of right foot     Problem List There are no active problems to display for this patient.   [name] ,PT, DPT, E-RYT  07/25/2017, 10:18 AM  [place] MAIN [facility] SERVICES [address] [place], [place], [address] Phone: [phone]   Fax:  [phone]  Name: Carolyn Gregory MRN: [id number] Date of Birth: Nov 07, 1960

## 2017-07-25 NOTE — Patient Instructions (Addendum)
Band for hips:  "knee lifts and lower slowing down" Hold band with elbows bent and forearm by your ribs without moving them  Place ballmound of foot on band Inhale lift knee Exhale slowly lower foot to floor   10 x 3 reps    _______ Stretch thigh before standing if you have sat for too long  "chair lunge"   To stretch L thigh  turn body and hips 90 deg to R, lower L thigh and knee towards ground, toes tucked  5 breaths

## 2017-08-08 ENCOUNTER — Ambulatory Visit: Payer: Non-veteran care | Admitting: Physical Therapy

## 2017-08-08 DIAGNOSIS — G8929 Other chronic pain: Secondary | ICD-10-CM

## 2017-08-08 DIAGNOSIS — M791 Myalgia, unspecified site: Secondary | ICD-10-CM

## 2017-08-08 DIAGNOSIS — M25571 Pain in right ankle and joints of right foot: Secondary | ICD-10-CM

## 2017-08-08 DIAGNOSIS — M5442 Lumbago with sciatica, left side: Principal | ICD-10-CM

## 2017-08-08 DIAGNOSIS — M25572 Pain in left ankle and joints of left foot: Secondary | ICD-10-CM

## 2017-08-08 NOTE — Therapy (Addendum)
[place] MAIN [facility] SERVICES [address] [place], [place], [address] Phone: [phone]   Fax:  [phone]  Physical Therapy Treatment  Patient Details  Name: Carolyn Gregory MRN: [id number] Date of Birth: 08-Dec-1960 Referring Provider: Timmie Foerster    Encounter Date: 08/08/2017  PT End of Session - 08/08/17 1351    Visit Number  11    Date for PT Re-Evaluation  08/30/17    Authorization Type  g code 9    PT Start Time  0910    PT Stop Time  1010    PT Time Calculation (min)  60 min    Activity Tolerance  Patient tolerated treatment well;No increased pain    Behavior During Therapy  WFL for tasks assessed/performed       Past Medical History:  Diagnosis Date  . Anxiety   . Depression   . Hypertension     Past Surgical History:  Procedure Laterality Date  . CHOLECYSTECTOMY    . MOHS SURGERY     to remove benign growth  located in the L low abdomen  . R ankle surgery   2016   to repair lateral tendon   . VAGINAL HYSTERECTOMY      There were no vitals filed for this visit.  Subjective Assessment - 08/08/17 0915    Subjective  Pt reports she felt drained and tired after last session. The L groin pain have improved by 80%. R numbness on lower back of the thighs to level of the knee. When walking, she notices the numbness.          [facility] PT Assessment - 08/08/17 0920      Sensation   Light Touch  -- L5 R posterior thigh decreased > L       Strength   Overall Strength Comments  hip flex L 4+/5, R 5/5       Palpation   Palpation comment  reproduction of R posterior pain at ischiotuberosity R, adductor proximal and distal attachments R with increased tensions                    [facility] Adult PT Treatment/Exercise - 08/08/17 1146      Neuro Re-ed    Neuro Re-ed Details   see pt instructions.  Withheld plantarflexion seated with green band due to report of shooting pain. Ylelow band did not cause shooting  pain       Modalities   Modalities  -- heat, posterior gluts      Manual Therapy   Internal Pelvic Floor  STM at problem areas              PT Education - 08/08/17 1351    Education provided  Yes    Education Details  HEP    Person(s) Educated  Patient    Methods  Explanation;Tactile cues;Demonstration;Verbal cues;Handout    Comprehension  Returned demonstration;Verbalized understanding          PT Long Term Goals - 07/25/17 1005      PT LONG TERM GOAL #1   Title  Pt will decrease her ODI score from 48% to < 38% in order to return to ADLs ( 10/30: 46%, 11/30: 38%)     Time  12    Period  Weeks    Status  Achieved      PT LONG TERM GOAL #2   Title  Pt will decrease her LEFS score from   25%  to > 30%  in order to walk and have less pain at her R ankle.  (11/30: 26%)     Time  12    Period  Weeks    Status  Partially Met      PT LONG TERM GOAL #3   Title  Pt will demo less B ankle instability, improved gait in order to minimize risk for injuries and falls     Time  12    Period  Weeks    Status  Partially Met      PT LONG TERM GOAL #4   Title  Pt will demo no pelvic obliquities and normal arthrokinematic motion of SIJ in order to minimize radiating pain to walk longer distances    Time  4    Period  Weeks    Status  Achieved      PT LONG TERM GOAL #5   Title  Pt will demo no pelvic floor mm tensions and proper ROM in order to eliminate urine and restore pelvic floor function    Time  12    Period  Weeks    Status  Achieved      PT LONG TERM GOAL #6   Title  Pt will demo decreased scar restrictions over abdomen, IND with abdominal massage, and report more regular bowel movements of 1-2 days in order to restore GI health    Time  12    Period  Weeks    Status  Achieved      PT LONG TERM GOAL #7   Title  Pt will demo no tensions/ tenderness at L sacrococcygeus ligament and coccygeus mm in order to sit for > 30 min and have no pain upon standing    Time  4     Period  Weeks    Status  Achieved      PT LONG TERM GOAL #8   Title  Pt will report no pain 100% of the time with crossing L ankle over R thigh and then getting up from chair  across 1 week in order to don shoes.     Time  4    Period  Weeks    Status  New    Target Date  08/22/17            Plan - 08/08/17 1351    Clinical Impression Statement  Pt 's remaining R posterior  thigh numbness resolved post Tx. Pt's R plantaflexion strength in open chain was weaker compared to L which may likely be assocaited with her increased mm tensions at ischial tuberosity attachments on RLE.  Plan to address her knees which have been an issue for her due to the way she had fallen in the past. Pt reported her L groin pain has improved by 80% since last session but she still noticed L hip pain while the R posterior thigh numbness resolved after today's Tx.  Plan to continue addressing her L hip issues and lower kinetic chain at upcoming sessions. Pt continues to show improved gait and is gaining more strength and ability to perofmr more daily acitvities.   Pt reported she has been feeling tired and drained. PT has left a message on pt's phone after session to ensure pt follow up with her PCP about this Sx.   Pt continues to benefit from skilled PT.     Rehab Potential  Good    PT Frequency  1x / week    PT Duration  12 weeks    PT Treatment/Interventions  Functional mobility training;Stair training;Gait training;Patient/family education;Aquatic Therapy;Therapeutic activities;Manual lymph drainage;Moist Heat;Neuromuscular re-education;Therapeutic exercise;Manual techniques;Taping;Energy conservation    Consulted and Agree with Plan of Care  Patient       Patient will benefit from skilled therapeutic intervention in order to improve the following deficits and impairments:  Abnormal gait, Pain, Increased fascial restricitons, Decreased strength, Decreased scar mobility, Increased muscle spasms,  Difficulty walking, Decreased mobility, Decreased range of motion, Decreased balance, Decreased safety awareness, Decreased coordination, Postural dysfunction, Improper body mechanics, Decreased endurance  Visit Diagnosis: Chronic bilateral low back pain with left-sided sciatica  Myalgia  Pain in left ankle and joints of left foot  Pain in right ankle and joints of right foot     Problem List There are no active problems to display for this patient.   [name] ,PT, DPT, E-RYT  08/08/2017, 1:56 PM  [place] MAIN [facility] SERVICES [address] [place], [place], [address] Phone: [phone]   Fax:  [phone]  Name: Carolyn Gregory MRN: [id number] Date of Birth: [birth year]

## 2017-08-08 NOTE — Patient Instructions (Signed)
R foot only, with yellow band   Heel on floor, toes up, press big toe mound down  30 reps

## 2017-08-12 ENCOUNTER — Telehealth: Payer: Self-pay | Admitting: Physical Therapy

## 2017-08-12 NOTE — Telephone Encounter (Signed)
PT phoned pt to f/u with on how her energy level has been since her last PT session because she has reported feeling tired and low energy. PT left a message informing pt to f/u with her PCP to make sure her labs are okay if she continues to feel fatigued.

## 2017-08-16 ENCOUNTER — Ambulatory Visit: Payer: No Typology Code available for payment source | Admitting: Physical Therapy

## 2017-08-22 ENCOUNTER — Ambulatory Visit: Payer: No Typology Code available for payment source | Admitting: Physical Therapy

## 2017-08-28 ENCOUNTER — Emergency Department
Admission: EM | Admit: 2017-08-28 | Discharge: 2017-08-28 | Disposition: A | Discharge status: Home / Self Care | Payer: Non-veteran care | Attending: Student in an Organized Health Care Education/Training Program | Admitting: Student in an Organized Health Care Education/Training Program

## 2017-08-28 ENCOUNTER — Other Ambulatory Visit: Payer: Self-pay

## 2017-08-28 ENCOUNTER — Emergency Department: Payer: Non-veteran care

## 2017-08-28 ENCOUNTER — Encounter: Payer: Self-pay | Admitting: Emergency Medicine

## 2017-08-28 DIAGNOSIS — I10 Essential (primary) hypertension: Secondary | ICD-10-CM | POA: Diagnosis not present

## 2017-08-28 DIAGNOSIS — Y999 Unspecified external cause status: Secondary | ICD-10-CM | POA: Insufficient documentation

## 2017-08-28 DIAGNOSIS — Z79899 Other long term (current) drug therapy: Secondary | ICD-10-CM | POA: Insufficient documentation

## 2017-08-28 DIAGNOSIS — Y92810 Car as the place of occurrence of the external cause: Secondary | ICD-10-CM | POA: Insufficient documentation

## 2017-08-28 DIAGNOSIS — S62639A Displaced fracture of distal phalanx of unspecified finger, initial encounter for closed fracture: Secondary | ICD-10-CM

## 2017-08-28 DIAGNOSIS — S6992XA Unspecified injury of left wrist, hand and finger(s), initial encounter: Secondary | ICD-10-CM | POA: Diagnosis present

## 2017-08-28 DIAGNOSIS — Y9389 Activity, other specified: Secondary | ICD-10-CM | POA: Diagnosis not present

## 2017-08-28 DIAGNOSIS — S62629A Displaced fracture of medial phalanx of unspecified finger, initial encounter for closed fracture: Secondary | ICD-10-CM

## 2017-08-28 DIAGNOSIS — S6010XA Contusion of unspecified finger with damage to nail, initial encounter: Secondary | ICD-10-CM

## 2017-08-28 DIAGNOSIS — W230XXA Caught, crushed, jammed, or pinched between moving objects, initial encounter: Secondary | ICD-10-CM | POA: Insufficient documentation

## 2017-08-28 DIAGNOSIS — S62631A Displaced fracture of distal phalanx of left index finger, initial encounter for closed fracture: Secondary | ICD-10-CM | POA: Diagnosis not present

## 2017-08-28 DIAGNOSIS — S60122A Contusion of left index finger with damage to nail, initial encounter: Secondary | ICD-10-CM | POA: Diagnosis not present

## 2017-08-28 MED ORDER — TRAMADOL HCL 50 MG PO TABS: 50.0000 mg | ORAL_TABLET | Freq: Four times a day (QID) | ORAL | 0 refills | Status: DC | PRN

## 2017-08-28 NOTE — ED Notes (Signed)
See triage note  States she shut her left index finger in door this am  Swelling and bruising noted to left index finger ,below nail bed

## 2017-08-28 NOTE — ED Provider Notes (Signed)
[facility] Emergency Department Provider Note ____________________________________________  Time seen: Approximately 12:20 PM  I have reviewed the triage vital signs and the nursing notes.   HISTORY  Chief Complaint Hand Pain    HPI Carolyn BosworthJennifer D Maniaci is a 57 y.o. female who presents to the emergency department for evaluation and treatment of left index finger pain.  She states that she accidentally shut her finger in the door this morning at home.  She discussed some swelling and bruising noted to the left index finger just below the nail bed.  She has artificial nails but does not notice that there is bleeding underneath the nail.  She describes pressure and pain that seems to be worsening over time.  No alleviating measures have been attempted for this complaint prior to arrival.  Past Medical History:  Diagnosis Date  . Anxiety   . Depression   . Hypertension     There are no active problems to display for this patient.   Past Surgical History:  Procedure Laterality Date  . CHOLECYSTECTOMY    . MOHS SURGERY     to remove benign growth  located in the L low abdomen  . R ankle surgery   2016   to repair lateral tendon   . VAGINAL HYSTERECTOMY      Prior to Admission medications   Medication Sig Start Date End Date Taking? Authorizing Provider  cholecalciferol (VITAMIN D) 400 units TABS tablet Take 1,000 Units by mouth.    [provider]  dicyclomine (BENTYL) 20 MG tablet Take 1 tablet (20 mg total) by mouth every 6 (six) hours as needed. 06/26/17   Irean HongSung, Jade J, MD  famotidine (PEPCID) 20 MG tablet Take 1 tablet (20 mg total) by mouth 2 (two) times daily. 06/26/17   Irean HongSung, Jade J, MD  gabapentin (NEURONTIN) 100 MG capsule Take 100 mg by mouth 3 (three) times daily.    [provider]  hydrOXYzine (ATARAX/VISTARIL) 25 MG tablet Take 25 mg by mouth 3 (three) times daily as needed.    [provider]  ondansetron (ZOFRAN ODT)  4 MG disintegrating tablet Take 1 tablet (4 mg total) by mouth every 8 (eight) hours as needed for nausea or vomiting. 06/25/17   Minna AntisPaduchowski, Kevin, MD  pantoprazole (PROTONIX) 40 MG tablet Take 40 mg by mouth daily.    [provider]  traMADol (ULTRAM) 50 MG tablet Take 1 tablet (50 mg total) by mouth every 6 (six) hours as needed. 08/28/17   Ermina Oberman B, FNP  triamterene-hydrochlorothiazide (DYAZIDE) 37.5-25 MG capsule Take 1 capsule by mouth daily.    [provider]    Allergies Codeine and Hctz [hydrochlorothiazide]  Family History  Problem Relation Age of Onset  . Diabetes Mother     Social History Social History   Tobacco Use  . Smoking status: Never Smoker  . Smokeless tobacco: Never Used  Substance Use Topics  . Alcohol use: No  . Drug use: No    Review of Systems Constitutional: Negative for recent illness. Cardiovascular: Negative for chest pain or shortness of breath Respiratory: Negative for cough Musculoskeletal: Positive for left index finger pain. Skin: Positive for subungual hematoma Neurological: Negative for weakness.  Negative for paresthesias.  ____________________________________________   PHYSICAL EXAM:  VITAL SIGNS: ED Triage Vitals  Enc Vitals Group     BP 08/28/17 1121 (!) 144/91     Pulse Rate 08/28/17 1121 76     Resp 08/28/17 1121 14  Temp 08/28/17 1121 98.3 F (36.8 C)     Temp Source 08/28/17 1121 Oral     SpO2 08/28/17 1121 100 %     Weight 08/28/17 1122 235 lb (106.6 kg)     Height 08/28/17 1122 5\' 7"  (1.702 m)     Head Circumference --      Peak Flow --      Pain Score 08/28/17 1121 10     Pain Loc --      Pain Edu? --      Excl. in GC? --     Constitutional: Alert and oriented. Well appearing and in no acute distress. Eyes: Conjunctivae are clear without discharge or drainage Head: Atraumatic Neck: Supple Respiratory: Respirations are even and unlabored. Musculoskeletal: Positive for  tenderness over the medial aspect of the DIP and PIP.  Patient is able to demonstrate active range of motion of both. Neurologic: Motor and sensory function is intact, specifically to the left index finger. Skin: Subungual hematomas noted under the nail of the left index finger Psychiatric: Affect and behavior are appropriate.  ____________________________________________   LABS (all labs ordered are listed, but only abnormal results are displayed)  Labs Reviewed - No data to display ____________________________________________  RADIOLOGY  Image of the left index finger shows a mildly displaced fracture at the base of the distal phalanx and the proximal edge of the middle phalanx along the ulnar side. I, Kem Boroughs, personally viewed and evaluated these images (plain radiographs) as part of my medical decision making, as well as reviewing the written report by the radiologist.  ___________________________________________   PROCEDURES  .Marland KitchenIncision and Drainage Date/Time: 08/28/2017 6:10 PM Performed by: Chinita Pester, FNP Authorized by: Chinita Pester, FNP   Consent:    Consent obtained:  Verbal   Consent given by:  Patient   Risks discussed:  Bleeding, infection, incomplete drainage and pain   Alternatives discussed:  Alternative treatment, delayed treatment and observation Location:    Type:  Subungual hematoma   Location:  Upper extremity   Upper extremity location:  Finger   Finger location:  L index finger Pre-procedure details:    Skin preparation:  Betadine Procedure type:    Complexity:  Simple Procedure details:    Incision depth:  Subungual   Drainage:  Bloody   Drainage amount:  Scant Post-procedure details:    Patient tolerance of procedure:  Tolerated well, no immediate complications  ____________________________________________   INITIAL IMPRESSION / ASSESSMENT AND PLAN / ED COURSE  Carolyn Gregory is a 57 y.o. female who presents to the  emergency department for evaluation treatment after closing her left index finger in the door this morning.  X-ray shows mildly displaced fractures of the DIP and PIP.  Subungual hematoma was drained with electrocautery.  Finger splint was applied to the left index finger by the ER tech.  Patient is to call and schedule follow-up appointment with orthopedics.  She will be given a prescription for tramadol and advised that she may also take Tylenol ibuprofen.  She was encouraged to return to the emergency department for symptoms that change or worsen if scheduled  Medications - No data to display  Pertinent labs & imaging results that were available during my care of the patient were reviewed by me and considered in my medical decision making (see chart for details).  _________________________________________   FINAL CLINICAL IMPRESSION(S) / ED DIAGNOSES  Final diagnoses:  Displaced fracture of distal phalanx of finger of left hand  Displaced fracture of middle phalanx of finger of left hand  Subungual hematoma of digit of hand, initial encounter    ED Discharge Orders        Ordered    traMADol (ULTRAM) 50 MG tablet  Every 6 hours PRN     08/28/17 1215       If controlled substance prescribed during this visit, 12 month history viewed on the [facility] prior to issuing an initial prescription for Schedule II or III opiod.    Chinita Pester, FNP 08/28/17 1817    Willy Eddy, MD 08/29/17 [phone]

## 2017-08-28 NOTE — ED Triage Notes (Signed)
Pt to ED with point left finger pain after getting it caught in door in the house.  Some swelling noted.  No obvious deformity, nail bed intact.

## 2017-08-28 NOTE — Discharge Instructions (Signed)
Please follow up with orthopedics. Return to the ER for symptoms that change or worsen if unable to schedule an appointment with orthopedics or primary care.

## 2017-09-09 ENCOUNTER — Ambulatory Visit: Payer: No Typology Code available for payment source | Attending: Internal Medicine | Admitting: Physical Therapy

## 2017-09-09 DIAGNOSIS — G8929 Other chronic pain: Secondary | ICD-10-CM | POA: Diagnosis present

## 2017-09-09 DIAGNOSIS — M25572 Pain in left ankle and joints of left foot: Secondary | ICD-10-CM

## 2017-09-09 DIAGNOSIS — M25571 Pain in right ankle and joints of right foot: Secondary | ICD-10-CM

## 2017-09-09 DIAGNOSIS — M791 Myalgia, unspecified site: Secondary | ICD-10-CM | POA: Diagnosis present

## 2017-09-09 DIAGNOSIS — M5442 Lumbago with sciatica, left side: Secondary | ICD-10-CM | POA: Diagnosis present

## 2017-09-09 NOTE — Patient Instructions (Signed)
1    childs poses rocking with pillow under belly   Toes tucked, shoulders downa nd back, paws grip , hands shoulder width apart       2a   Stretch for pelvic floor   "v heels slide away and then back toward buttocks and then rock knee to slight ,  slide heel along at 11 o clock away from buttocks   10 reps    2b. Frog stretch    3   Wall stretch:  low lunge with feet flat   Reach higher with the hand on the asame side as  Your back foot Knees above ankle,  Ground down with back foot  To feel the stretch in the groin  5 reps   _______  Do not delay signal bladder/ rectal fullness   To minimize pelvic floor tightness

## 2017-09-09 NOTE — Therapy (Addendum)
[place] MAIN [facility] SERVICES [address] [place], [place], [address] Phone: [phone]   Fax:  [phone]  Physical Therapy Treatment [facility] Note  Patient Details  Name: Carolyn Gregory MRN: [id number] Date of Birth: [birth year] Referring Provider: Timmie Foerster    Encounter Date: 09/09/2017  PT End of Session - 09/09/17 1411    Visit Number  12    Date for PT Re-Evaluation  11/07/17    Authorization Type  g code 9    PT Start Time  1310    PT Stop Time  1409    PT Time Calculation (min)  59 min    Activity Tolerance  Patient tolerated treatment well;No increased pain    Behavior During Therapy  WFL for tasks assessed/performed       Past Medical History:  Diagnosis Date  . Anxiety   . Depression   . Hypertension     Past Surgical History:  Procedure Laterality Date  . CHOLECYSTECTOMY    . MOHS SURGERY     to remove benign growth  located in the L low abdomen  . R ankle surgery   2016   to repair lateral tendon   . VAGINAL HYSTERECTOMY      There were no vitals filed for this visit.  Subjective Assessment - 09/09/17 1318    Subjective  Pt noticed the L buttock and hip area pain returned after 1 month. There is some throbbing and she has to sit down after walking. There is no radiating pain. She feels the crossing of her ankle over her R thigh to stretch the L hip aggravates it.          [facility] PT Assessment - 09/09/17 1444      Observation/Other Assessments   Observations  no cue needed for correcting lumbar lordosis       Lunges   Comments  propioception poor with lower kinetic chain alignment B . demo'd correctly post cues       Palpation   Spinal mobility  increased hypomobility at upper lumbar segments and tightenss at [employer]       Ambulation/Gait   Gait Comments  increased lateral shift to L of pelvis                Pelvic Floor Special Questions - 09/09/17 1446    Pelvic Floor Internal Exam  pt  was explained the details of the exam and pt consented verbally without contraindications .     Exam Type  Vaginal    Palpation  tensions/ burning/ tightenss at  B iliococcygeus, obt int , piriformis, fascial along ATLA  R > L . overactive posterior pelvic floor mm     Strength  good squeeze, good lift, able to hold agaisnt strong resistance                PT Education - 09/09/17 1427    Education provided  Yes    Education Details  HEP    Person(s) Educated  Patient    Methods  Explanation;Demonstration;Tactile cues;Verbal cues;Handout    Comprehension  Returned demonstration;Verbalized understanding          PT Long Term Goals - 07/25/17 1005      PT LONG TERM GOAL #1   Title  Pt will decrease her ODI score from 48% to < 38% in order to return to ADLs ( 10/30: 46%, 11/30: 38%)     Time  12  Period  Weeks    Status  Achieved      PT LONG TERM GOAL #2   Title  Pt will decrease her LEFS score from   25% to > 30%  in order to walk and have less pain at her R ankle.  (11/30: 26%)     Time  12    Period  Weeks    Status  Partially Met      PT LONG TERM GOAL #3   Title  Pt will demo less B ankle instability, improved gait in order to minimize risk for injuries and falls     Time  12    Period  Weeks    Status  Partially Met      PT LONG TERM GOAL #4   Title  Pt will demo no pelvic obliquities and normal arthrokinematic motion of SIJ in order to minimize radiating pain to walk longer distances    Time  4    Period  Weeks    Status  Achieved      PT LONG TERM GOAL #5   Title  Pt will demo no pelvic floor mm tensions and proper ROM in order to eliminate urine and restore pelvic floor function    Time  12    Period  Weeks    Status  Achieved      PT LONG TERM GOAL #6   Title  Pt will demo decreased scar restrictions over abdomen, IND with abdominal massage, and report more regular bowel movements of 1-2 days in order to restore GI health    Time  12    Period   Weeks    Status  Achieved      PT LONG TERM GOAL #7   Title  Pt will demo no tensions/ tenderness at L sacrococcygeus ligament and coccygeus mm in order to sit for > 30 min and have no pain upon standing    Time  4    Period  Weeks    Status  Achieved      PT LONG TERM GOAL #8   Title  Pt will report no pain 100% of the time with crossing L ankle over R thigh and then getting up from chair  across 1 week in order to don shoes.     Time  4    Period  Weeks    Status  New    Target Date  08/22/17            Plan - 09/09/17 1412    Clinical Impression Statement  Pt returned to PT after 1 month hiatus due to waiting for insurnace authorization. Pt returned with onset of L posterior glut and LBP this week after having no radiating pain from her low back for 1 month. Pt showed increased posterior pelvic floor tightenss and tenderness during assessment along with increased upper lumbar hypomobility and tightness and L pelvis shift with gait. Post Tx, these deficits improved. Pt was educated on not delaying the signal of a full rectum with explanation to how posterior pelvic floor mm tighten up with this behavior. Advanced pt to spinal,hip, and pelvic floor stretches and dynamic stabilization exercises. Pt showed good carry o ver of improvements with increased deep core strength, ability to correct for increased lumbar lordosis, and increased pelvic girdle/ hip mobility.  Pt continues to benefit from skilled PT.     Rehab Potential  Good    PT Frequency  1x / week  PT Duration  12 weeks    PT Treatment/Interventions  Functional mobility training;Stair training;Gait training;Patient/family education;Aquatic Therapy;Therapeutic activities;Manual lymph drainage;Moist Heat;Neuromuscular re-education;Therapeutic exercise;Manual techniques;Taping;Energy conservation    Consulted and Agree with Plan of Care  Patient       Patient will benefit from skilled therapeutic intervention in order to  improve the following deficits and impairments:  Abnormal gait, Pain, Increased fascial restricitons, Decreased strength, Decreased scar mobility, Increased muscle spasms, Difficulty walking, Decreased mobility, Decreased range of motion, Decreased balance, Decreased safety awareness, Decreased coordination, Postural dysfunction, Improper body mechanics, Decreased endurance  Visit Diagnosis: Myalgia  Pain in left ankle and joints of left foot  Pain in right ankle and joints of right foot  Chronic 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   [name] ,PT, DPT, E-RYT  09/09/2017, 2:49 PM  [place] MAIN [facility] SERVICES [address] [place], [place], [address] Phone: [phone]   Fax:  [phone]  Name: Carolyn Gregory MRN: [id number] Date of Birth: 1961/06/03

## 2017-09-09 NOTE — Addendum Note (Signed)
Addended by: Mariane MastersYEUNG, SHIN-YIING on: 09/09/2017 02:55 PM   Modules accepted: Orders

## 2017-09-17 ENCOUNTER — Ambulatory Visit: Payer: No Typology Code available for payment source | Admitting: Physical Therapy

## 2017-09-17 DIAGNOSIS — M791 Myalgia, unspecified site: Secondary | ICD-10-CM

## 2017-09-17 DIAGNOSIS — M25572 Pain in left ankle and joints of left foot: Secondary | ICD-10-CM

## 2017-09-17 DIAGNOSIS — G8929 Other chronic pain: Secondary | ICD-10-CM

## 2017-09-17 DIAGNOSIS — M25571 Pain in right ankle and joints of right foot: Secondary | ICD-10-CM

## 2017-09-17 DIAGNOSIS — M5442 Lumbago with sciatica, left side: Secondary | ICD-10-CM

## 2017-09-17 NOTE — Patient Instructions (Signed)
Decreased sound / effort at upper abdominal muscle with exhalation  "Elevator" imagery for quick pelvic floor 1 sec x 10 with deep core level 1   Biopsychosocial approach with relaxation onto table to minimize pelvic floor tightness    Finding pelvic neutral to minimize low back arch when on the bed   Transition to bed from seated to sidelying to log roll to minimze pubic pain

## 2017-09-17 NOTE — Therapy (Signed)
[place] MAIN [facility] SERVICES [address] [place], [place], [address] Phone: [phone]   Fax:  [phone]  Physical Therapy Treatment  Patient Details  Name: Carolyn Gregory MRN: [id number] Date of Birth: Dec 02, 1960 Referring Provider: Timmie Foerster    Encounter Date: 09/17/2017  PT End of Session - 09/17/17 1706    Visit Number  13    Date for PT Re-Evaluation  11/07/17    Authorization Type  g code 9    PT Start Time  0762    PT Stop Time  2633    PT Time Calculation (min)  53 min    Activity Tolerance  Patient tolerated treatment well;No increased pain    Behavior During Therapy  WFL for tasks assessed/performed       Past Medical History:  Diagnosis Date  . Anxiety   . Depression   . Hypertension     Past Surgical History:  Procedure Laterality Date  . CHOLECYSTECTOMY    . MOHS SURGERY     to remove benign growth  located in the L low abdomen  . R ankle surgery   2016   to repair lateral tendon   . VAGINAL HYSTERECTOMY      There were no vitals filed for this visit.  Subjective Assessment - 09/17/17 1706    Subjective  Pt reported the L hip pain that returned after prior to last session has improved by 60%. Pt also had acupuncture also which helped her as well. Pt has returned to walking without throbbing pain and she is able to cross L ankle over thigh for short periods of time without pain.  The original radiating pain down R posterior thigh has occured "just a little" in frequency and intensity and ends above the back of the knee.  A  new pain occurs the front middle part of her L thigh and it shoots above the knee by 3 inches without specific activities. It is brief and and infrequent.            [facility] PT Assessment - 09/17/17 1837      Coordination   Gross Motor Movements are Fluid and Coordinated  -- excessive oblique mm overuse w/ cue for pelvic floor               Pelvic Floor Special  Questions - 09/17/17 1835    Pelvic Floor Internal Exam  pt was explained the details of the exam and pt consented verbally without contraindications .     Exam Type  Vaginal    Palpation  no tightness / burning in previous areas noted last session.  Decreased anterior mm activation with slightly lower position of bladder posterior to pubic bone. Post Tx, cranial position of bladder and circumferential and sequential contraciton of all 3 mm layers were achieved    Strength  good squeeze, good lift, able to hold agaisnt strong resistance initially posterior > anterior.     Strength # of reps  10    Strength # of seconds  1        [facility] Adult PT Treatment/Exercise - 09/17/17 1837      Neuro Re-ed    Neuro Re-ed Details   see pt instructions.  Withheld plantarflexion seated with green band due to report of shooting pain. Ylelow band did not cause shooting pain       Manual Therapy   Internal Pelvic Floor  facilitation of anterior mm to elicit a more  circumferential / sequential contraction             PT Education - 09/17/17 1840    Education provided  Yes    Education Details  HEP    Person(s) Educated  Patient    Methods  Explanation;Demonstration;Tactile cues;Verbal cues    Comprehension  Returned demonstration;Verbalized understanding;Tactile cues required          PT Long Term Goals - 09/17/17 1843      PT LONG TERM GOAL #1   Title  Pt will decrease her ODI score from 48% to < 38% in order to return to ADLs ( 10/30: 46%, 11/30: 38%)     Time  12    Period  Weeks    Status  Achieved      PT LONG TERM GOAL #2   Title  Pt will decrease her LEFS score from   25% to > 30%  in order to walk and have less pain at her R ankle.  (11/30: 26%)     Time  12    Period  Weeks    Status  Partially Met      PT LONG TERM GOAL #3   Title  Pt will demo less B ankle instability, improved gait in order to minimize risk for injuries and falls     Time  12    Period  Weeks     Status  Partially Met      PT LONG TERM GOAL #4   Title  Pt will demo no pelvic obliquities and normal arthrokinematic motion of SIJ in order to minimize radiating pain to walk longer distances    Time  4    Period  Weeks    Status  Achieved      PT LONG TERM GOAL #5   Title  Pt will demo no pelvic floor mm tensions and proper ROM in order to eliminate urine and restore pelvic floor function    Time  12    Period  Weeks    Status  Achieved      Additional Long Term Goals   Additional Long Term Goals  Yes      PT LONG TERM GOAL #6   Title  Pt will demo decreased scar restrictions over abdomen, IND with abdominal massage, and report more regular bowel movements of 1-2 days in order to restore GI health    Time  12    Period  Weeks    Status  Achieved      PT LONG TERM GOAL #7   Title  Pt will demo no tensions/ tenderness at L sacrococcygeus ligament and coccygeus mm in order to sit for > 30 min and have no pain upon standing    Time  4    Period  Weeks    Status  Achieved      PT LONG TERM GOAL #8   Title  Pt will report no pain 100% of the time with crossing L ankle over R thigh and then getting up from chair  across 1 week in order to don shoes.     Time  4    Period  Weeks    Status  Partially Met      PT LONG TERM GOAL  #9   TITLE  Pt will demo no lowered position of bladder and demo Grade 3/3/3/3 in order to improve pelvic girdle and back stability to increase physical activities without less risk for relapse  Time  8    Period  Weeks    Status  New    Target Date  11/12/17            Plan - 09/17/17 1841    Clinical Impression Statement  Pt showed good carry over with no tightness/ burning sensation in areas noted in last session. Pt achieved proper pelvic floor full ROM and progressed to quick contractions after manual Tx to elicit a more circumferential and sequential contraction and coordination training to minimzie overuse of oblique mm with crnail movement  of pelvic floor mm. Pt required biopsychosocial approaches and body mechanics training to relax pelvic floor and minimize back /pubic pain during tranfers. Pt continues to benefit from skilled PT    Rehab Potential  Good    PT Frequency  1x / week    PT Duration  12 weeks    PT Treatment/Interventions  Functional mobility training;Stair training;Gait training;Patient/family education;Aquatic Therapy;Therapeutic activities;Manual lymph drainage;Moist Heat;Neuromuscular re-education;Therapeutic exercise;Manual techniques;Taping;Energy conservation    Consulted and Agree with Plan of Care  Patient       Patient will benefit from skilled therapeutic intervention in order to improve the following deficits and impairments:  Abnormal gait, Pain, Increased fascial restricitons, Decreased strength, Decreased scar mobility, Increased muscle spasms, Difficulty walking, Decreased mobility, Decreased range of motion, Decreased balance, Decreased safety awareness, Decreased coordination, Postural dysfunction, Improper body mechanics, Decreased endurance  Visit Diagnosis: Myalgia  Pain in left ankle and joints of left foot  Pain in right ankle and joints of right foot  Chronic 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   [name] ,PT, DPT, E-RYT   09/17/2017, 6:45 PM  [place] MAIN [facility] SERVICES [address] [place], [place], [address] Phone: [phone]   Fax:  [phone]  Name: Carolyn Gregory MRN: [id number] Date of Birth: Sep 04, 1960

## 2017-09-25 ENCOUNTER — Ambulatory Visit: Payer: No Typology Code available for payment source | Admitting: Physical Therapy

## 2017-09-25 DIAGNOSIS — G8929 Other chronic pain: Secondary | ICD-10-CM

## 2017-09-25 DIAGNOSIS — M5442 Lumbago with sciatica, left side: Secondary | ICD-10-CM

## 2017-09-25 DIAGNOSIS — M791 Myalgia, unspecified site: Secondary | ICD-10-CM

## 2017-09-25 DIAGNOSIS — M25571 Pain in right ankle and joints of right foot: Secondary | ICD-10-CM

## 2017-09-25 DIAGNOSIS — M25572 Pain in left ankle and joints of left foot: Secondary | ICD-10-CM

## 2017-09-25 NOTE — Therapy (Signed)
[place] [facility] SERVICES [address] [place], [place], [address] Phone: [phone]   Fax:  [phone]  Physical Therapy Treatment  Patient Details  Name: Carolyn Gregory MRN: [id number] Date of Birth: 1960/12/24 Referring Provider: Timmie Foerster    Encounter Date: 09/25/2017  PT End of Session - 09/25/17 0855    Visit Number  14    Date for PT Re-Evaluation  11/07/17    Authorization Type  g code 9    PT Start Time  8638    PT Stop Time  0900    PT Time Calculation (min)  43 min    Activity Tolerance  Patient tolerated treatment well;No increased pain    Behavior During Therapy  WFL for tasks assessed/performed       Past Medical History:  Diagnosis Date  . Anxiety   . Depression   . Hypertension     Past Surgical History:  Procedure Laterality Date  . CHOLECYSTECTOMY    . MOHS SURGERY     to remove benign growth  located in the L low abdomen  . R ankle surgery   2016   to repair lateral tendon   . VAGINAL HYSTERECTOMY      There were no vitals filed for this visit.  Subjective Assessment - 09/25/17 0845    Subjective  Pt noticed L LBP after the sessions last week . Pt reported she would like to have heat on her back for today's Tx because she does better with heat.         [facility] PT Assessment - 09/25/17 0846      Observation/Other Assessments   Observations  crossed ankle over opp thigh for doffing of shoe ( increased hip mobility)                Pelvic Floor Special Questions - 09/25/17 0847    Pelvic Floor Internal Exam  pt was explained the details of the exam and pt consented verbally without contraindications .     Exam Type  Vaginal    Palpation   slightly limited mobility at anterior mm, post Tx: elicited more circumferential/ sequential contraction     Strength  good squeeze, good lift, able to hold agaisnt strong resistance initially R > L     Strength # of reps  10    Strength # of  seconds  1        [facility] Adult PT Treatment/Exercise - 09/25/17 0848      Neuro Re-ed    Neuro Re-ed Details   see pt instructions.  Withheld plantarflexion seated with green band due to report of shooting pain. Ylelow band did not cause shooting pain       Modalities   Modalities  -- heat, posterior gluts/ perineum ( pillow case wrapping heat       Manual Therapy   Internal Pelvic Floor  facilitation of anterior mm to elicit a more circumferential / sequential contraction             PT Education - 09/25/17 0855    Education provided  Yes    Education Details  HEP    Person(s) Educated  Patient    Methods  Explanation;Demonstration;Tactile cues;Verbal cues;Handout    Comprehension  Returned demonstration;Verbalized understanding;Verbal cues required;Tactile cues required          PT Long Term Goals - 09/17/17 1843      PT LONG TERM GOAL #1   Title  Pt will decrease her ODI score from 48% to < 38% in order to return to ADLs ( 10/30: 46%, 11/30: 38%)     Time  12    Period  Weeks    Status  Achieved      PT LONG TERM GOAL #2   Title  Pt will decrease her LEFS score from   25% to > 30%  in order to walk and have less pain at her R ankle.  (11/30: 26%)     Time  12    Period  Weeks    Status  Partially Met      PT LONG TERM GOAL #3   Title  Pt will demo less B ankle instability, improved gait in order to minimize risk for injuries and falls     Time  12    Period  Weeks    Status  Partially Met      PT LONG TERM GOAL #4   Title  Pt will demo no pelvic obliquities and normal arthrokinematic motion of SIJ in order to minimize radiating pain to walk longer distances    Time  4    Period  Weeks    Status  Achieved      PT LONG TERM GOAL #5   Title  Pt will demo no pelvic floor mm tensions and proper ROM in order to eliminate urine and restore pelvic floor function    Time  12    Period  Weeks    Status  Achieved      Additional Long Term Goals   Additional  Long Term Goals  Yes      PT LONG TERM GOAL #6   Title  Pt will demo decreased scar restrictions over abdomen, IND with abdominal massage, and report more regular bowel movements of 1-2 days in order to restore GI health    Time  12    Period  Weeks    Status  Achieved      PT LONG TERM GOAL #7   Title  Pt will demo no tensions/ tenderness at L sacrococcygeus ligament and coccygeus mm in order to sit for > 30 min and have no pain upon standing    Time  4    Period  Weeks    Status  Achieved      PT LONG TERM GOAL #8   Title  Pt will report no pain 100% of the time with crossing L ankle over R thigh and then getting up from chair  across 1 week in order to don shoes.     Time  4    Period  Weeks    Status  Partially Met      PT LONG TERM GOAL  #9   TITLE  Pt will demo no lowered position of bladder and demo Grade 3/3/3/3 in order to improve pelvic girdle and back stability to increase physical activities without less risk for relapse    Time  8    Period  Weeks    Status  New    Target Date  11/12/17            Plan - 09/25/17 0855    Clinical Impression Statement  Pt cotntinued to show improved pelvic floor ROM but required more manual Tx to faciliate more activation of anterior pelvic floor mm and to elicit a more circumferential and sequential contractin of all 3 layers of the pelvic floor. Pt showed a more cranial  position of her bladder post Tx as well. Pt was provided more relaxation strategies and used of heat after the manual Tx. She had no complaints and reported feeling more relaxed and was able to notice when she had the tendency to contract and tighten her body but was able to relax more quickly than before. Pt continues to benefit frmo skilled PT. Plan to  progress pt to dynamic balance exercises to navigate stairs and on uneven ground to be in the community setting with more safety and less risk for injuries/ falls.      Rehab Potential  Good    PT Frequency  1x /  week    PT Duration  12 weeks    PT Treatment/Interventions  Functional mobility training;Stair training;Gait training;Patient/family education;Aquatic Therapy;Therapeutic activities;Manual lymph drainage;Moist Heat;Neuromuscular re-education;Therapeutic exercise;Manual techniques;Taping;Energy conservation    Consulted and Agree with Plan of Care  Patient       Patient will benefit from skilled therapeutic intervention in order to improve the following deficits and impairments:  Abnormal gait, Pain, Increased fascial restricitons, Decreased strength, Decreased scar mobility, Increased muscle spasms, Difficulty walking, Decreased mobility, Decreased range of motion, Decreased balance, Decreased safety awareness, Decreased coordination, Postural dysfunction, Improper body mechanics, Decreased endurance  Visit Diagnosis: Myalgia  Pain in left ankle and joints of left foot  Pain in right ankle and joints of right foot  Chronic 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   [name] ,PT, DPT, E-RYT  09/25/2017, 9:00 AM  [place] MAIN [facility] SERVICES [address] [place], [place], [address] Phone: [phone]   Fax:  [phone]  Name: Carolyn Gregory MRN: [id number] Date of Birth: Jul 26, 1960

## 2017-09-25 NOTE — Patient Instructions (Addendum)
Elevator imagery   Inhale to basement Exhale to 2nd floor , quick squeeze (like a telescope to activate 1-3rd layers sequentially and circumferentially)   Practice quick squeeze with coughing, sneezing,    And when you do Deep core level 1 ( breathing + add quick squeeze x 10) and then do Deep core level 2 ( 6 min knee out w/ no squeeze but just notice the natural lift with exhalation)   _______  Vocalization for relaxation   Toning to allow your exhalation to bring ribcage down towards pelvis  (inhale: inflate into the back, front, left/right so the ribcage expands like tree ring)   Rest your hands at the ribcage to sense the movement downward as you make these sounds long and smooth  "ng" as in "sing"  Visualize violet at the crown of the head  "Mmmm"  Visualize indigo between eye brows   "Eee"Visualize blue at the throat  "A" as "Play"  Visualize green at the heart    "ah" as in "laaaa"     Visualize yellow at abdomen  "Oooo" as "Do" Visualize orange at the sexual organs  "O " as "Tote"  Visualize red at the [employer] RxPerineum   Finish with body scan   This exercise will help with moving the diaphragm/pelvic floor  at its full range

## 2017-10-03 ENCOUNTER — Ambulatory Visit: Payer: No Typology Code available for payment source | Admitting: Physical Therapy

## 2017-10-04 ENCOUNTER — Telehealth: Payer: Self-pay | Admitting: Physical Therapy

## 2017-10-04 NOTE — Telephone Encounter (Signed)
PT tried to call pt after discovering she left her appt before PT was able to bring her back. PT wanted to apologize for running 17 min behind, check in with her, and try to r/s her appt. PT tried calling on 10/03/17 ( day of her appt) but was not able to leave a voice mail and also left a message on her emergency contact Scherrie Merritts( Hendrick Mcreynolds) on 10/04/17.

## 2017-10-21 ENCOUNTER — Ambulatory Visit: Payer: No Typology Code available for payment source | Admitting: Physical Therapy

## 2017-11-01 ENCOUNTER — Ambulatory Visit: Payer: No Typology Code available for payment source | Attending: Internal Medicine | Admitting: Physical Therapy

## 2017-11-01 DIAGNOSIS — G8929 Other chronic pain: Secondary | ICD-10-CM | POA: Diagnosis present

## 2017-11-01 DIAGNOSIS — M25571 Pain in right ankle and joints of right foot: Secondary | ICD-10-CM

## 2017-11-01 DIAGNOSIS — M25572 Pain in left ankle and joints of left foot: Secondary | ICD-10-CM

## 2017-11-01 DIAGNOSIS — M791 Myalgia, unspecified site: Secondary | ICD-10-CM | POA: Diagnosis present

## 2017-11-01 DIAGNOSIS — M5442 Lumbago with sciatica, left side: Secondary | ICD-10-CM | POA: Diagnosis present

## 2017-11-01 NOTE — Addendum Note (Signed)
Addended by: Mariane MastersYEUNG, SHIN-YIING on: 11/01/2017 02:42 PM   Modules accepted: Orders

## 2017-11-01 NOTE — Patient Instructions (Addendum)
Abdominal massage upward from L,  R , center to belly button 3 stroke x 3 , pressure is gentle and light with all fingers flat, not using finger tips   Heel raises with towel rolled with rubberband to squeeze at the heels  10 x 2    Standing lunges  front knee above ankle, not move,  10 reps x 2    Biopsychosocial Approaches:  psychotherapy,  [email or web address]  [facility]( [facility])

## 2017-11-01 NOTE — Therapy (Signed)
[place] MAIN [facility] SERVICES [address] [place], [place], [address] Phone: [phone]   Fax:  [phone]  Physical Therapy Treatment [facility] Note   Patient Details  Name: Carolyn Gregory MRN: [id number] Date of Birth: 07/09/1961 Referring Provider: Timmie Foerster    Encounter Date: 11/01/2017  PT End of Session - 11/01/17 1027    Visit Number  15    Date for PT Re-Evaluation  01/24/18    Authorization Type  g code 9    PT Start Time  1012    PT Stop Time  1100    PT Time Calculation (min)  48 min    Activity Tolerance  Patient tolerated treatment well;No increased pain    Behavior During Therapy  WFL for tasks assessed/performed       Past Medical History:  Diagnosis Date  . Anxiety   . Depression   . Hypertension     Past Surgical History:  Procedure Laterality Date  . CHOLECYSTECTOMY    . MOHS SURGERY     to remove benign growth  located in the L low abdomen  . R ankle surgery   2016   to repair lateral tendon   . VAGINAL HYSTERECTOMY     Physical Therapy Progress Note   Dates of reporting period    06/07/17  to  11/01/17 There were no vitals filed for this visit.  Subjective Assessment - 11/01/17 1015    Subjective  Pt reports that her R front thigh radiating pain has improved 80% and is happening as often. Pt notices swelling in her ankles / excruciating pain after walking on uneven ground          [facility] PT Assessment - 11/01/17 1028      Lunges   Comments  twinge in R hip after 5 steps of walking lunges. withheld this exerise. HEP with stationery position.       Strength   Overall Strength Comments  plantarflexion Grade 3/5 10 repsR, 10 reps L ( with more instability).  hip/knee flex/ext, 5/5      Palpation   Palpation comment  severely restricted scars over low abdomen                 Pelvic Floor Special Questions - 11/01/17 1100    Pelvic Floor Internal Exam  pt was explained the  details of the exam and pt consented verbally without contraindications .     Exam Type  Vaginal    Palpation  B anterior mm tightness with burning,      Strength  good squeeze, good lift, able to hold agaisnt strong resistance        [facility] Adult PT Treatment/Exercise - 11/01/17 1100      Neuro Re-ed    Neuro Re-ed Details   see pt instructions.  Withheld plantarflexion seated with green band due to report of shooting pain. Ylelow band did not cause shooting pain       Modalities   Modalities  -- heat,perineum ( pillow case wrapping heat pack       Manual Therapy   Internal Pelvic Floor  STM / fascial releases B anterior mm to elicit a more circumferential / sequential contraction             PT Education - 11/01/17 1027    Education provided  Yes    Education Details  HEP    Person(s) Educated  Patient    Methods  Explanation;Demonstration;Tactile  cues;Verbal cues;Handout    Comprehension  Verbalized understanding;Returned demonstration          PT Long Term Goals - 11/01/17 1016      PT LONG TERM GOAL #1   Title  Pt will decrease her ODI score from 48% to < 38% in order to return to ADLs ( 10/30: 46%, 11/30: 38%)     Time  12    Period  Weeks    Status  Achieved      PT LONG TERM GOAL #2   Title  Pt will decrease her LEFS score from   25% to > 30%  in order to walk and have less pain at her R ankle.  (11/30: 26%, 4/26: 29% )     Time  12    Period  Weeks    Status  Achieved      PT LONG TERM GOAL #3   Title  Pt will demo less B ankle instability on uneven ground, improved gait in order to minimize risk for injuries and falls     Time  12    Period  Weeks    Status  Revised      PT LONG TERM GOAL #4   Title  Pt will demo no pelvic obliquities and normal arthrokinematic motion of SIJ in order to minimize radiating pain to walk longer distances    Time  4    Period  Weeks    Status  Achieved      PT LONG TERM GOAL #5   Title  Pt will demo no pelvic  floor mm tensions and proper ROM in order to eliminate urine and restore pelvic floor function    Time  12    Period  Weeks    Status  Partially Met      Additional Long Term Goals   Additional Long Term Goals  Yes      PT LONG TERM GOAL #6   Title  Pt will demo decreased scar restrictions over abdomen, IND with abdominal massage, and report more regular bowel movements of 1-2 days in order to restore GI health    Time  12    Period  Weeks    Status  Partially Met      PT LONG TERM GOAL #7   Title  Pt will demo no tensions/ tenderness at L sacrococcygeus ligament and coccygeus mm in order to sit for > 30 min and have no pain upon standing    Time  4    Period  Weeks    Status  Achieved      PT LONG TERM GOAL #8   Title  Pt will report no pain 100% of the time with crossing L ankle over R thigh and then getting up from chair  across 1 week in order to don shoes.     Time  4    Period  Weeks    Status  Partially Met      PT LONG TERM GOAL  #9   TITLE  Pt will demo no lowered position of bladder and demo Grade 3/3/3/3 in order to improve pelvic girdle and back stability to increase physical activities without less risk for relapse    Time  8    Period  Weeks    Status  On-going      PT LONG TERM GOAL  #10   TITLE  Pt will increase plantarflexion reps from 10 reps B to > 20  reps B in order to climb stairs and progress towards balance training on uneven ground     Time  12    Period  Weeks    Status  New    Target Date  01/24/18            Plan - 11/01/17 1103    Clinical Impression Statement  Pt has achieved 4/10 goals and is progressing well towards her remaining goals. Pt's pelvic pain has signficantly decreased with primary tensions located at anterior mm compared to widespread areas bilaterally. Pt's radiating R anterior thigh pain has also improved by self report of 80%.  Pt 's LBP pain has improved with increased hip and SIJ mobility. Pt's gait has improved with  longer strides and push off. Pt is now working on increasing ankle strength to minimize ankle instability and risk for falls. Pt will also benefit from balance training on uneven ground to ambulate safely in various settings. Pt continues to benefit from skilled PT.      Rehab Potential  Good    PT Frequency  1x / week    PT Duration  12 weeks    PT Treatment/Interventions  Functional mobility training;Stair training;Gait training;Patient/family education;Aquatic Therapy;Therapeutic activities;Manual lymph drainage;Moist Heat;Neuromuscular re-education;Therapeutic exercise;Manual techniques;Taping;Energy conservation    Consulted and Agree with Plan of Care  Patient       Patient will benefit from skilled therapeutic intervention in order to improve the following deficits and impairments:  Abnormal gait, Pain, Increased fascial restricitons, Decreased strength, Decreased scar mobility, Increased muscle spasms, Difficulty walking, Decreased mobility, Decreased range of motion, Decreased balance, Decreased safety awareness, Decreased coordination, Postural dysfunction, Improper body mechanics, Decreased endurance  Visit Diagnosis: Myalgia  Pain in left ankle and joints of left foot  Pain in right ankle and joints of right foot  Chronic 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   [name] ,PT, DPT, E-RYT  11/01/2017, 2:39 PM  [place] MAIN [facility] SERVICES [address] [place], [place], [address] Phone: [phone]   Fax:  [phone]  Name: Carolyn Gregory MRN: [id number] Date of Birth: Oct 16, 1960

## 2017-11-05 ENCOUNTER — Ambulatory Visit: Payer: No Typology Code available for payment source | Admitting: Physical Therapy

## 2017-11-05 DIAGNOSIS — M791 Myalgia, unspecified site: Secondary | ICD-10-CM | POA: Diagnosis not present

## 2017-11-05 DIAGNOSIS — M5442 Lumbago with sciatica, left side: Secondary | ICD-10-CM

## 2017-11-05 DIAGNOSIS — M25572 Pain in left ankle and joints of left foot: Secondary | ICD-10-CM

## 2017-11-05 DIAGNOSIS — M25571 Pain in right ankle and joints of right foot: Secondary | ICD-10-CM

## 2017-11-05 DIAGNOSIS — G8929 Other chronic pain: Secondary | ICD-10-CM

## 2017-11-05 NOTE — Patient Instructions (Addendum)
Standing longer periods:  Keep 50% weight on both legs, whether you shift on ski tracks or side by side under hip width apart  Center of mass ( behind navel) more front   And shifting across ballmound and heel  But just weight all in the heels and COM back is more back   ( handout)   _______   Walking with more weight into the ground, arm swings, higher thighs to push into the ground more, land more on mid/forefoot

## 2017-11-05 NOTE — Therapy (Signed)
[place] MAIN [facility] SERVICES [address] [place], [place], [address] Phone: [phone]   Fax:  [phone]  Physical Therapy Treatment  Patient Details  Name: Carolyn Gregory MRN: [id number] Date of Birth: 1961-01-08 Referring Provider: Timmie Foerster    Encounter Date: 11/05/2017  PT End of Session - 11/05/17 1702    Visit Number  16    Date for PT Re-Evaluation  01/24/18    PT Start Time  3291    PT Stop Time  1702    PT Time Calculation (min)  57 min    Activity Tolerance  Patient tolerated treatment well;No increased pain    Behavior During Therapy  WFL for tasks assessed/performed       Past Medical History:  Diagnosis Date  . Anxiety   . Depression   . Hypertension     Past Surgical History:  Procedure Laterality Date  . CHOLECYSTECTOMY    . MOHS SURGERY     to remove benign growth  located in the L low abdomen  . R ankle surgery   2016   to repair lateral tendon   . VAGINAL HYSTERECTOMY      There were no vitals filed for this visit.  Subjective Assessment - 11/05/17 1618    Subjective  Pt reported standing under 10 min and noticed a twinge in her L glut radiating down past the L knee but quick to ease.  Pt tried to not lean more weight on one leg          [facility] PT Assessment - 11/05/17 1704      Palpation   Palpation comment  severely restricted abdominal scars.  L midfoot hypomobility, R lateral scar restrictions by lateral malleoli  ( improved post Tx       Ambulation/Gait   Gait Comments  posterior COM, heel striking, excessive pelvic shift, no trunk rotation ( improved post Tx)                Pelvic Floor Special Questions - 11/05/17 1703    Pelvic Floor Internal Exam  pt was explained the details of the exam and pt consented verbally without contraindications .     Exam Type  Vaginal    Palpation  less B anterior tightenss ( L > R) , less burning with external manual Tx on abdominal scars       Strength  good squeeze, good lift, able to hold agaisnt strong resistance        [facility] Adult PT Treatment/Exercise - 11/05/17 1704      Neuro Re-ed    Neuro Re-ed Details   see pt instructions.  Withheld plantarflexion seated with green band due to report of shooting pain. Ylelow band did not cause shooting pain       Manual Therapy   Manual therapy comments  AP/AP mob at L navicular/ midfoot joints to improve plantarflexion/ inversion.  abdominal scar releases      Internal Pelvic Floor  STM / fascial releases B anterior mm to elicit a more circumferential / sequential contraction                  PT Long Term Goals - 11/01/17 1016      PT LONG TERM GOAL #1   Title  Pt will decrease her ODI score from 48% to < 38% in order to return to ADLs ( 10/30: 46%, 11/30: 38%)     Time  12  Period  Weeks    Status  Achieved      PT LONG TERM GOAL #2   Title  Pt will decrease her LEFS score from   25% to > 30%  in order to walk and have less pain at her R ankle.  (11/30: 26%, 4/26: 29% )     Time  12    Period  Weeks    Status  Achieved      PT LONG TERM GOAL #3   Title  Pt will demo less B ankle instability on uneven ground, improved gait in order to minimize risk for injuries and falls     Time  12    Period  Weeks    Status  Revised      PT LONG TERM GOAL #4   Title  Pt will demo no pelvic obliquities and normal arthrokinematic motion of SIJ in order to minimize radiating pain to walk longer distances    Time  4    Period  Weeks    Status  Achieved      PT LONG TERM GOAL #5   Title  Pt will demo no pelvic floor mm tensions and proper ROM in order to eliminate urine and restore pelvic floor function    Time  12    Period  Weeks    Status  Partially Met      Additional Long Term Goals   Additional Long Term Goals  Yes      PT LONG TERM GOAL #6   Title  Pt will demo decreased scar restrictions over abdomen, IND with abdominal massage, and report more regular  bowel movements of 1-2 days in order to restore GI health    Time  12    Period  Weeks    Status  Partially Met      PT LONG TERM GOAL #7   Title  Pt will demo no tensions/ tenderness at L sacrococcygeus ligament and coccygeus mm in order to sit for > 30 min and have no pain upon standing    Time  4    Period  Weeks    Status  Achieved      PT LONG TERM GOAL #8   Title  Pt will report no pain 100% of the time with crossing L ankle over R thigh and then getting up from chair  across 1 week in order to don shoes.     Time  4    Period  Weeks    Status  Partially Met      PT LONG TERM GOAL  #9   TITLE  Pt will demo no lowered position of bladder and demo Grade 3/3/3/3 in order to improve pelvic girdle and back stability to increase physical activities without less risk for relapse    Time  8    Period  Weeks    Status  On-going      PT LONG TERM GOAL  #10   TITLE  Pt will increase plantarflexion reps from 10 reps B to > 20 reps B in order to climb stairs and progress towards balance training on uneven ground     Time  12    Period  Weeks    Status  New    Target Date  01/24/18            Plan - 11/05/17 1702    Clinical Impression Statement  Pt showed less anterior pelvic floor tightness and abdominal  scar restrictions post Tx. Pt also showed improved plantarflexion/ inversion B with manual Tx to increase mobility in L navicular/ midfoot joints and R lateral ankle scar restrictions. Pt also demo'd improved gait mechanics with more trunk rotation, less pelvic rotation, and more anterior COM with midfoot strike post training. Pt continues to show improvements with skilled PT. Plan to continue release abdominal scar restrictions for improved spinal/pelvic stability  and progress with postural stability/ dynamic balance training in community like settings.      Rehab Potential  Good    PT Frequency  1x / week    PT Duration  12 weeks    PT Treatment/Interventions  Functional  mobility training;Stair training;Gait training;Patient/family education;Aquatic Therapy;Therapeutic activities;Manual lymph drainage;Moist Heat;Neuromuscular re-education;Therapeutic exercise;Manual techniques;Taping;Energy conservation    Consulted and Agree with Plan of Care  Patient       Patient will benefit from skilled therapeutic intervention in order to improve the following deficits and impairments:  Abnormal gait, Pain, Increased fascial restricitons, Decreased strength, Decreased scar mobility, Increased muscle spasms, Difficulty walking, Decreased mobility, Decreased range of motion, Decreased balance, Decreased safety awareness, Decreased coordination, Postural dysfunction, Improper body mechanics, Decreased endurance  Visit Diagnosis: Myalgia  Pain in left ankle and joints of left foot  Pain in right ankle and joints of right foot  Chronic 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   [name] ,PT, DPT, E-RYT  11/05/2017, 5:10 PM  [place] MAIN [facility] SERVICES [address] [place], [place], [address] Phone: [phone]   Fax:  [phone]  Name: Carolyn Gregory MRN: [id number] Date of Birth: 01/22/1961

## 2017-12-03 ENCOUNTER — Telehealth: Payer: Self-pay | Admitting: Physical Therapy

## 2017-12-23 ENCOUNTER — Ambulatory Visit: Payer: No Typology Code available for payment source | Attending: Internal Medicine | Admitting: Physical Therapy

## 2017-12-23 DIAGNOSIS — M25572 Pain in left ankle and joints of left foot: Secondary | ICD-10-CM | POA: Diagnosis present

## 2017-12-23 DIAGNOSIS — M25571 Pain in right ankle and joints of right foot: Secondary | ICD-10-CM | POA: Diagnosis present

## 2017-12-23 DIAGNOSIS — M791 Myalgia, unspecified site: Secondary | ICD-10-CM | POA: Insufficient documentation

## 2017-12-23 DIAGNOSIS — M5442 Lumbago with sciatica, left side: Secondary | ICD-10-CM | POA: Diagnosis present

## 2017-12-23 DIAGNOSIS — G8929 Other chronic pain: Secondary | ICD-10-CM | POA: Insufficient documentation

## 2017-12-23 NOTE — Therapy (Addendum)
[place] MAIN [facility] SERVICES [address] [place], [place], [address] Phone: [phone]   Fax:  [phone]  Physical Therapy Treatment / Progress Note  Patient Details  Name: Carolyn Gregory MRN: [id number] Date of Birth: 08/31/1960 Referring Provider: Timmie Foerster    Encounter Date: 12/23/2017  PT End of Session - 12/23/17 1051    Visit Number  1   Date for PT Re-Evaluation  02/24/18    Authorization Type  new authorization ends 9/30  For 15 visits    PT Start Time  3154    PT Stop Time  1059    PT Time Calculation (min)  44 min    Activity Tolerance  Patient tolerated treatment well;No increased pain    Behavior During Therapy  WFL for tasks assessed/performed       Past Medical History:  Diagnosis Date  . Anxiety   . Depression   . Hypertension     Past Surgical History:  Procedure Laterality Date  . CHOLECYSTECTOMY    . MOHS SURGERY     to remove benign growth  located in the L low abdomen  . R ankle surgery   2016   to repair lateral tendon   . VAGINAL HYSTERECTOMY      There were no vitals filed for this visit.  Subjective Assessment - 12/23/17 1015    Subjective  Pt is doing pretty good. Pt feels discomfort in the L low back and more tightness along the R side         [facility] PT Assessment - 12/23/17 1046      Strength   Overall Strength Comments  improved eccentric control on PF  B       Palpation   Palpation comment  increased tenderness/ tensions B SIJ  ( L > R)   Decreased tenderness with HEP                Pelvic Floor Special Questions - 12/23/17 1055    Pelvic Floor Internal Exam  pt was explained the details of the exam and pt consented verbally without contraindications .     Exam Type  Vaginal    Palpation  no mm tenderness/ tightness    Strength  good squeeze, good lift, able to hold agaisnt strong resistance        [facility] Adult PT Treatment/Exercise - 12/23/17 1046      Neuro  Re-ed    Neuro Re-ed Details   see pt instructions      Exercises   Exercises  -- see pt instructions             PT Education - 12/23/17 1046    Education provided  Yes    Education Details  HEP    Person(s) Educated  Patient    Methods  Explanation;Demonstration;Tactile cues;Verbal cues;Handout    Comprehension  Returned demonstration;Verbalized understanding          PT Long Term Goals - 12/23/17 1044      PT LONG TERM GOAL #1   Title  Pt will decrease her ODI score from 48% to < 38% in order to return to ADLs ( 10/30: 46%, 11/30: 38%)     Time  12    Period  Weeks    Status  Achieved      PT LONG TERM GOAL #2   Title  Pt will decrease her LEFS score from   25% to > 30%  in  order to walk and have less pain at her R ankle.  (11/30: 26%, 4/26: 29% )     Time  12    Period  Weeks    Status  Achieved      PT LONG TERM GOAL #3   Title  Pt will demo less B ankle instability on uneven ground, improved gait in order to minimize risk for injuries and falls     Time  12    Period  Weeks    Status  Revised      PT LONG TERM GOAL #4   Title  Pt will demo no pelvic obliquities and normal arthrokinematic motion of SIJ in order to minimize radiating pain to walk longer distances    Time  4    Period  Weeks    Status  Achieved      PT LONG TERM GOAL #5   Title  Pt will demo no pelvic floor mm tensions and proper ROM in order to eliminate urine and restore pelvic floor function    Time  12    Period  Weeks    Status  Achieved      PT LONG TERM GOAL #6   Title  Pt will demo decreased scar restrictions over abdomen, IND with abdominal massage, and report more regular bowel movements of 1-2 days in order to restore GI health    Time  12    Period  Weeks    Status  Achieved      PT LONG TERM GOAL #7   Title  Pt will demo no tensions/ tenderness at L sacrococcygeus ligament and coccygeus mm in order to sit for > 30 min and have no pain upon standing    Time  4     Period  Weeks    Status  Achieved      PT LONG TERM GOAL #8   Title  Pt will report no pain 100% of the time with crossing L ankle over R thigh and then getting up from chair  across 1 week in order to don shoes.     Time  4    Period  Weeks    Status  Partially Met      PT LONG TERM GOAL  #9   TITLE  Pt will demo no lowered position of bladder and demo Grade 3/3/3/3 in order to improve pelvic girdle and back stability to increase physical activities without less risk for relapse    Time  8    Period  Weeks    Status  On-going      PT LONG TERM GOAL  #10   TITLE  Pt will increase plantarflexion reps from 10 reps B to > 20 reps B in order to climb stairs and progress towards balance training on uneven ground     Time  12    Period  Weeks    Status  New            Plan - 12/23/17 1059    Clinical Impression Statement Pt has achieved 6/10 goals and is progressing well towards remaining goals. Today, pt showed minor pelvic floor tightness and tenderness upon assessment after returning to PT after 2 months. Pt progressed to upright exercises for strength and stretches of the lower kinetic chain. Plan to incorporate stairs into next session for dynamic stability and strengthening. Pt will benefit from PT 2x week to incorporate aquatic therapy for overall strengthening and balance training. Pt  continues benefit for skilled PT.    Rehab Potential  Good    PT Frequency  2x / week    PT Duration  9 weeks    PT Treatment/Interventions  Functional mobility training;Stair training;Gait training;Patient/family education;Aquatic Therapy;Therapeutic activities;Manual lymph drainage;Moist Heat;Neuromuscular re-education;Therapeutic exercise;Manual techniques;Taping;Energy conservation    Consulted and Agree with Plan of Care  Patient       Patient will benefit from skilled therapeutic intervention in order to improve the following deficits and impairments:  Abnormal gait, Pain, Increased fascial  restricitons, Decreased strength, Decreased scar mobility, Increased muscle spasms, Difficulty walking, Decreased mobility, Decreased range of motion, Decreased balance, Decreased safety awareness, Decreased coordination, Postural dysfunction, Improper body mechanics, Decreased endurance  Visit Diagnosis: Myalgia  Pain in left ankle and joints of left foot  Pain in right ankle and joints of right foot  Chronic 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   [name] ,PT, DPT, E-RYT  12/23/2017, 11:01 AM  [place] MAIN [facility] SERVICES [address] [place], [place], [address] Phone: [phone]   Fax:  [phone]  Name: Carolyn Gregory MRN: [id number] Date of Birth: 1961/04/13

## 2017-12-23 NOTE — Patient Instructions (Addendum)
Stretches for low back:   Cross thigh over other,    exhale pull strap to bring knees closer to you   _________  YOGA:  eagle pose to release back of hips  Sit in a chair position, feet together  Cross R thigh over L, press them strongly against each other  Cross R arm over L arm, palms clasp  Stay and breath 3-5 breaths  Sitting a little deeper    ______   Calves stretch   Ski track stance,  Bend back knee and straighten, pressing heel down more into the floor    _____  Standing marches Hand on opposite thigh 3 mins  X 2 day  Lower foot down slow with wide toes/ ballmounds

## 2017-12-25 ENCOUNTER — Ambulatory Visit: Payer: No Typology Code available for payment source | Admitting: Physical Therapy

## 2017-12-30 ENCOUNTER — Ambulatory Visit: Payer: No Typology Code available for payment source | Admitting: Physical Therapy

## 2018-01-01 ENCOUNTER — Ambulatory Visit: Payer: No Typology Code available for payment source | Admitting: Physical Therapy

## 2018-01-01 DIAGNOSIS — M791 Myalgia, unspecified site: Secondary | ICD-10-CM | POA: Diagnosis not present

## 2018-01-01 DIAGNOSIS — M5442 Lumbago with sciatica, left side: Secondary | ICD-10-CM

## 2018-01-01 DIAGNOSIS — M25572 Pain in left ankle and joints of left foot: Secondary | ICD-10-CM

## 2018-01-01 DIAGNOSIS — M25571 Pain in right ankle and joints of right foot: Secondary | ICD-10-CM

## 2018-01-01 DIAGNOSIS — G8929 Other chronic pain: Secondary | ICD-10-CM

## 2018-01-01 NOTE — Patient Instructions (Addendum)
Toes up, ballmounds down, slide 4 points of foot back with arch lifted  R  10 x reps x 3    _____   Discontinue eagle pose

## 2018-01-02 ENCOUNTER — Telehealth: Payer: Self-pay | Admitting: Physical Therapy

## 2018-01-02 NOTE — Telephone Encounter (Signed)
Pt emailed PT stating the area that got worked on yesterday's session ( medial R ankle) continues to have improvement at a level 4/10 pain instead of 10/10. However, the arches in B feet increased pain after pt got back home and is at 8/10.  Pt denied limping nor excruciating pain that warranted  Immediate medical attention.   PT informed pt to soak her feet in epsom salt to schedule an appt for Friday 8am 01/03/18.  PT informed pt to withhold the new HEP for the R ankle but can do gentle ROM movements with ankles as long as there is no pain and to decrease on walking long distances. Pt voiced understanding.

## 2018-01-02 NOTE — Therapy (Addendum)
[place] MAIN [facility] SERVICES [address] [place], [place], [address] Phone: [phone]   Fax:  [phone]  Physical Therapy Treatment  Patient Details  Name: Carolyn Gregory MRN: [id number] Date of Birth: 04-21-1961 Referring Provider: Timmie Foerster    Encounter Date: 01/01/2018  PT End of Session - 01/01/18 1402    Visit Number  2    Number of Visits  15    Date for PT Re-Evaluation  02/24/18    Authorization Type  new authorization ends 9/30     PT Start Time  2761    PT Stop Time  1402    PT Time Calculation (min)  57 min    Activity Tolerance  Patient tolerated treatment well;No increased pain    Behavior During Therapy  WFL for tasks assessed/performed       Past Medical History:  Diagnosis Date  . Anxiety   . Depression   . Hypertension     Past Surgical History:  Procedure Laterality Date  . CHOLECYSTECTOMY    . MOHS SURGERY     to remove benign growth  located in the L low abdomen  . R ankle surgery   2016   to repair lateral tendon   . VAGINAL HYSTERECTOMY      There were no vitals filed for this visit.  Subjective Assessment - 01/01/18 1307    Subjective  Pt reports her R ankle has been at a 10/10 pain since she attempted the yoga eagle pose form last session. It is a stabbing shooting pain behind ankle bone and pain persists with even sitting. The swelling has gone down with icing. The midback pain has returned 6/10. L groin pain occurs after sitting and getting up. This groin pain eases within 5-10 min.   R  glut pain occurs in the upper thigh and does not extend into the foot.           [facility] PT Assessment - 01/01/18 1401      Palpation   Palpation comment  point tenderness along R tarsal tunnel, noted tightness along posterior anterior tendon      Strength:  B  Ankle eversion/ DF/ PF/ inversion : all 5/5   NOted scar restrictions along surgerical scars lateral and medial ankle above  malleoli            [facility] Adult PT Treatment/Exercise - 01/01/18 1405      Exercises   Exercises  -- see pt instructions      Manual Therapy   Manual therapy comments  distraction of talocrural, STM, and AP mob at navicular     Kinesiotex  --  to lift arch              PT Education - 01/01/18 1400    Education provided  Yes    Education Details  HEP    Person(s) Educated  Patient    Methods  Explanation;Demonstration;Tactile cues;Verbal cues;Handout    Comprehension  Returned demonstration;Verbalized understanding          PT Long Term Goals - 12/25/17 1353      PT LONG TERM GOAL #1   Title  Pt will decrease her ODI score from 48% to < 38% in order to return to ADLs ( 10/30: 46%, 11/30: 38%)     Time  12    Period  Weeks    Status  Achieved      PT LONG TERM GOAL #2  Title  Pt will decrease her LEFS score from   25% to > 30%  in order to walk and have less pain at her R ankle.  (11/30: 26%, 4/26: 29%, 6/19: 35%  )     Time  12    Period  Weeks    Status  Achieved      PT LONG TERM GOAL #3   Title  Pt will demo less B ankle instability on uneven ground, improved gait in order to minimize risk for injuries and falls     Time  12    Period  Weeks    Status  Revised      PT LONG TERM GOAL #4   Title  Pt will demo no pelvic obliquities and normal arthrokinematic motion of SIJ in order to minimize radiating pain to walk longer distances    Time  4    Period  Weeks    Status  Achieved      PT LONG TERM GOAL #5   Title  Pt will demo no pelvic floor mm tensions and proper ROM in order to eliminate urine and restore pelvic floor function    Time  12    Period  Weeks    Status  Achieved      Additional Long Term Goals   Additional Long Term Goals  Yes      PT LONG TERM GOAL #6   Title  Pt will demo decreased scar restrictions over abdomen, IND with abdominal massage, and report more regular bowel movements of 1-2 days in order to restore GI health     Time  12    Period  Weeks    Status  Achieved      PT LONG TERM GOAL #7   Title  Pt will demo no tensions/ tenderness at L sacrococcygeus ligament and coccygeus mm in order to sit for > 30 min and have no pain upon standing    Time  4    Period  Weeks    Status  Achieved      PT LONG TERM GOAL #8   Title  Pt will report no pain 100% of the time with crossing L ankle over R thigh and then getting up from chair  across 1 week in order to don shoes.     Time  4    Period  Weeks    Status  Achieved      PT LONG TERM GOAL  #9   TITLE  Pt will demo no lowered position of bladder and demo Grade 3/3/3/3 in order to improve pelvic girdle and back stability to increase physical activities without less risk for relapse    Time  8    Period  Weeks    Status  Partially Met      PT LONG TERM GOAL  #10   TITLE  Pt will increase plantarflexion reps from 10 reps B to > 20 reps B in order to climb stairs and progress towards balance training on uneven ground     Time  12    Period  Weeks    Status  New      PT LONG TERM GOAL  #11   TITLE  Pt will walk on uneven ground, moderate hills on a local nature trail for 1 mile for minimal pain < 2 /10 and no ankle sprain in order to enjoy her hobby     Time  12    Period  Weeks    Status  New    Target Date  03/19/18      PT LONG TERM GOAL  #12   TITLE  Pt will be able to fast walking ( levelled ground) for 45 min and compliance to stretching afterwards in order to  minimize risk for prolapse .      Time  8    Period  Weeks    Status  New    Target Date  02/19/18      PT LONG TERM GOAL  #13   TITLE  Pt will be able to climb stairs with proper technique , step-through pattern with one hand on rail up/ down across one flight in order to navigate community environments     Time  8    Period  Weeks    Status  New    Target Date  02/19/18      PT LONG TERM GOAL  #14   TITLE  Pt will report no calf pain with stairs up and down or  after walking at a  store for 15 min  in order to  progress towards dynamic balance exercises     Time  4    Period  Weeks    Status  New    Target Date  01/22/18      PT LONG TERM GOAL  #15   TITLE  Pt will demo proper alignment and modifications with yoga to integrate into commuinity yoga classess with less risk for relapse of Sx     Time  12    Period  Weeks    Status  New    Target Date  03/19/18            Plan - 01/01/18 1402    Clinical Impression Statement Pt did not respond well to the upright balancing exercise from last session ( yoga eagel pose) which had caused R ankle swelling and pain. This exercise has been withheld from her HEP with indication that pt will continue require open kinetic chain strengthening prior to closed kinetic chain.  Pt arrived today reporting the swelling has decreased with icing but the pain was still at a 10/10 level located at R medial ankle.  Assessment showed limited DF and hypomobility at navicular joint, point tenderness along tarsel tunnel but strong strength with DF. Eversion. PF. Inversion at both ankles ( 5/5 MMT open kinetic chain).  Following manual Tx which pt tolerated without complaints, pt's pain at R medial ankle decreased from 10/10 to 4/10 with decreased  tenderness with palpation from a widespread area to much a smaller area. Applied kinesiotape to lift arch.  Post Tx, she reported less fatigue in her low back with walking. Pt will start aquatic therapy next week for overall strengthening and dynamic balance training. Anticipate the buoyancy of the water will help pt make further progress with lower extremity training.        Rehab Potential  Good    PT Frequency  2x / week    PT Duration  12 weeks    PT Treatment/Interventions  Functional mobility training;Stair training;Gait training;Patient/family education;Aquatic Therapy;Therapeutic activities;Manual lymph drainage;Moist Heat;Neuromuscular re-education;Therapeutic exercise;Manual  techniques;Taping;Energy conservation    Consulted and Agree with Plan of Care  Patient       Patient will benefit from skilled therapeutic intervention in order to improve the following deficits and impairments:  Abnormal gait, Pain, Increased fascial restricitons, Decreased strength, Decreased scar mobility, Increased muscle spasms, Difficulty walking,  Decreased mobility, Decreased range of motion, Decreased balance, Decreased safety awareness, Decreased coordination, Postural dysfunction, Improper body mechanics, Decreased endurance  Visit Diagnosis: Myalgia  Pain in left ankle and joints of left foot  Pain in right ankle and joints of right foot  Chronic 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   [name] ,PT, DPT, E-RYT  01/02/2018, 10:42 AM  [place] MAIN [facility] SERVICES [address] [place], [place], [address] Phone: [phone]   Fax:  [phone]  Name: DUNYA MEINERS MRN: [id number] Date of Birth: 1961/01/09

## 2018-01-02 NOTE — Addendum Note (Signed)
Addended by: Mariane MastersYEUNG, SHIN-YIING on: 01/02/2018 10:28 AM   Modules accepted: Orders

## 2018-01-03 ENCOUNTER — Ambulatory Visit: Payer: No Typology Code available for payment source | Admitting: Physical Therapy

## 2018-01-03 DIAGNOSIS — M25572 Pain in left ankle and joints of left foot: Secondary | ICD-10-CM

## 2018-01-03 DIAGNOSIS — M5442 Lumbago with sciatica, left side: Secondary | ICD-10-CM

## 2018-01-03 DIAGNOSIS — G8929 Other chronic pain: Secondary | ICD-10-CM

## 2018-01-03 DIAGNOSIS — M791 Myalgia, unspecified site: Secondary | ICD-10-CM | POA: Diagnosis not present

## 2018-01-03 DIAGNOSIS — M25571 Pain in right ankle and joints of right foot: Secondary | ICD-10-CM

## 2018-01-03 NOTE — Patient Instructions (Addendum)
When practicing in the pool:  Forward walking:   Thigh high    shoulders forward slight    Land soft, feet hip width    Fore/midfoot     Not lock knees    Backward walking    Lean forward,     Keep weight on front knee / leg ( knee bent) until the back heel lowers slowly      Not too fast  _  When you notice back discomfort:  check with pelvic tilts and lengthening the back   sit to stand, stand to sit, shoudlers forward, lean, nose over toes   __  PELVIC FLOOR / KEGEL EXERCISES   Pelvic floor/ Kegel exercises are used to strengthen the muscles in the base of your pelvis that are responsible for supporting your pelvic organs and preventing urine/feces leakage. Based on your therapist's recommendations, they can be performed while standing, sitting, or lying down. Imagine pelvic floor area as a diamond with pelvic landmarks: top =pubic bone, bottom tip=tailbone, sides=sitting bones (ischial tuberosities).    Make yourself aware of this muscle group by using these cues while coordinating your breath:  Inhale, feel pelvic floor diamond area lower like hammock towards your feet and ribcage/belly expanding. Pause. Let the exhale naturally and feel your belly sink, abdominal muscles hugging in around you and you may notice the pelvic diamond draws upward towards your head forming a umbrella shape. Give a squeeze during the exhalation like you are stopping the flow of urine. If you are squeezing the buttock muscles, try to give 50% less effort.   Common Errors:  Breath holding: If you are holding your breath, you may be bearing down against your bladder instead of pulling it up. If you belly bulges up while you are squeezing, you are holding your breath. Be sure to breathe gently in and out while exercising. Counting out loud may help you avoid holding your breath.  Accessory muscle use: You should not see or feel other muscle movement when performing pelvic floor exercises. When done  properly, no one can tell that you are performing the exercises. Keep the buttocks, belly and inner thighs relaxed.  Overdoing it: Your muscles can fatigue and stop working for you if you over-exercise. You may actually leak more or feel soreness at the lower abdomen or rectum.  YOUR HOME EXERCISE PROGRAM    SHORT HOLDS: Position: on back, FEET FIRM   Inhale and then exhale. Then squeeze the muscle.  (Be sure to let belly sink in with exhales and not push outward)  Perform 5 repetitions, 5  Times/day                      DECREASE DOWNWARD PRESSURE ON  YOUR PELVIC FLOOR, ABDOMINAL, LOW BACK MUSCLES       PRESERVE YOUR PELVIC HEALTH LONG-TERM   ** SQUEEZE pelvic floor BEFORE YOUR SNEEZE, COUGH, LAUGH   ** EXHALE BEFORE YOU RISE AGAINST GRAVITY (lifting, sit to stand, from squat to stand)   ** LOG ROLL OUT OF BED INSTEAD OF CRUNCH/SIT-UP

## 2018-01-03 NOTE — Therapy (Addendum)
[place] MAIN [facility] SERVICES [address] [place], [place], [address] Phone: [phone]   Fax:  [phone]  Physical Therapy Treatment  Patient Details  Name: Carolyn Gregory MRN: [id number] Date of Birth: October 06, 1960 Referring Provider: Timmie Foerster    Encounter Date: 01/03/2018  PT End of Session - 01/03/18 1057    Visit Number  3    Number of Visits  15    Date for PT Re-Evaluation  02/24/18    Authorization Type  new authorization ends 9/30     PT Start Time  0810    PT Stop Time  0911    PT Time Calculation (min)  61 min    Activity Tolerance  Patient tolerated treatment well;No increased pain    Behavior During Therapy  WFL for tasks assessed/performed       Past Medical History:  Diagnosis Date  . Anxiety   . Depression   . Hypertension     Past Surgical History:  Procedure Laterality Date  . CHOLECYSTECTOMY    . MOHS SURGERY     to remove benign growth  located in the L low abdomen  . R ankle surgery   2016   to repair lateral tendon   . VAGINAL HYSTERECTOMY      There were no vitals filed for this visit.  Subjective Assessment - 01/03/18 0822    Subjective  Pt reported the R medial ankle stayed at a level of 4/10 which is the improvemet she made from last Tx. But she felt pain in the arches and heel of B feet after the Tx .It has subsided today.          [facility] PT Assessment - 01/03/18 0823      Sit to Stand   Comments  with increased lumbar lordosis , toe gripping                 Pelvic Floor Special Questions - 01/03/18 1730    Prolapse  Anterior Wall urethra lowered below pubic symphysis within introitus     Pelvic Floor Internal Exam  pt consented verbally without contraindication     Exam Type  Vaginal    Palpation  minor tenderness at deep layers, decreased post Tx,      Strength  good squeeze, good lift, able to hold agaisnt strong resistance initially 3/5.stronger contraction w/ cue  for feet co-activ    Strength # of reps  5    Strength # of seconds  1        [facility] Adult PT Treatment/Exercise - 01/03/18 1722      Therapeutic Activites    Therapeutic Activities  -- gait training      Neuro Re-ed    Neuro Re-ed Details   see pt instructions      Manual Therapy   Manual therapy comments  distraction of talocrural, STM, and AP/PA mob mid foot  L foot                   PT Long Term Goals - 12/25/17 1353      PT LONG TERM GOAL #1   Title  Pt will decrease her ODI score from 48% to < 38% in order to return to ADLs ( 10/30: 46%, 11/30: 38%)     Time  12    Period  Weeks    Status  Achieved      PT LONG TERM GOAL #2   Title  Pt will decrease her LEFS score from   25% to > 30%  in order to walk and have less pain at her R ankle.  (11/30: 26%, 4/26: 29%, 6/19: 35%  )     Time  12    Period  Weeks    Status  Achieved      PT LONG TERM GOAL #3   Title  Pt will demo less B ankle instability on uneven ground, improved gait in order to minimize risk for injuries and falls     Time  12    Period  Weeks    Status  Revised      PT LONG TERM GOAL #4   Title  Pt will demo no pelvic obliquities and normal arthrokinematic motion of SIJ in order to minimize radiating pain to walk longer distances    Time  4    Period  Weeks    Status  Achieved      PT LONG TERM GOAL #5   Title  Pt will demo no pelvic floor mm tensions and proper ROM in order to eliminate urine and restore pelvic floor function    Time  12    Period  Weeks    Status  Achieved      Additional Long Term Goals   Additional Long Term Goals  Yes      PT LONG TERM GOAL #6   Title  Pt will demo decreased scar restrictions over abdomen, IND with abdominal massage, and report more regular bowel movements of 1-2 days in order to restore GI health    Time  12    Period  Weeks    Status  Achieved      PT LONG TERM GOAL #7   Title  Pt will demo no tensions/ tenderness at L sacrococcygeus  ligament and coccygeus mm in order to sit for > 30 min and have no pain upon standing    Time  4    Period  Weeks    Status  Achieved      PT LONG TERM GOAL #8   Title  Pt will report no pain 100% of the time with crossing L ankle over R thigh and then getting up from chair  across 1 week in order to don shoes.     Time  4    Period  Weeks    Status  Achieved      PT LONG TERM GOAL  #9   TITLE  Pt will demo no lowered position of bladder and demo Grade 3/3/3/3 in order to improve pelvic girdle and back stability to increase physical activities without less risk for relapse    Time  8    Period  Weeks    Status  Partially Met      PT LONG TERM GOAL  #10   TITLE  Pt will increase plantarflexion reps from 10 reps B to > 20 reps B in order to climb stairs and progress towards balance training on uneven ground     Time  12    Period  Weeks    Status  New      PT LONG TERM GOAL  #11   TITLE  Pt will walk on uneven ground, moderate hills on a local nature trail for 1 mile for minimal pain < 2 /10 and no ankle sprain in order to enjoy her hobby     Time  12    Period  Weeks  Status  New    Target Date  03/19/18      PT LONG TERM GOAL  #12   TITLE  Pt will be able to fast walking ( levelled ground) for 45 min and compliance to stretching afterwards in order to  minimize risk for prolapse .      Time  8    Period  Weeks    Status  New    Target Date  02/19/18      PT LONG TERM GOAL  #13   TITLE  Pt will be able to climb stairs with proper technique , step-through pattern with one hand on rail up/ down across one flight in order to navigate community environments     Time  8    Period  Weeks    Status  New    Target Date  02/19/18      PT LONG TERM GOAL  #14   TITLE  Pt will report no calf pain with stairs up and down or  after walking at a store for 15 min  in order to  progress towards dynamic balance exercises     Time  4    Period  Weeks    Status  New    Target Date   01/22/18      PT LONG TERM GOAL  #15   TITLE  Pt will demo proper alignment and modifications with yoga to integrate into commuinity yoga classess with less risk for relapse of Sx     Time  12    Period  Weeks    Status  New    Target Date  03/19/18            Plan - 01/03/18 1058    Clinical Impression Statement  Pt showed improved R ankle mobility. Pt's pain at B arches resolved with gait training and neuromuscular re-education for increased hip flexion, less heel strike, and longer stride. Withheld last session's HEP. Pelvic assessment showed minimal pelvic floor mm tightness and thus, progressed pt to quick contractions. Pt performed correctly with cues and showed a stronger contraction and less pelvic floor tenderness with cues for feet co-activation. Pt received gait training to practice in the pool which she will start next week with aquatic therapist. Pt continues to benefit from skilled PT.       Rehab Potential  Good    PT Frequency  2x / week    PT Duration  12 weeks    PT Treatment/Interventions  Functional mobility training;Stair training;Gait training;Patient/family education;Aquatic Therapy;Therapeutic activities;Manual lymph drainage;Moist Heat;Neuromuscular re-education;Therapeutic exercise;Manual techniques;Taping;Energy conservation    Consulted and Agree with Plan of Care  Patient       Patient will benefit from skilled therapeutic intervention in order to improve the following deficits and impairments:  Abnormal gait, Pain, Increased fascial restricitons, Decreased strength, Decreased scar mobility, Increased muscle spasms, Difficulty walking, Decreased mobility, Decreased range of motion, Decreased balance, Decreased safety awareness, Decreased coordination, Postural dysfunction, Improper body mechanics, Decreased endurance  Visit Diagnosis: Myalgia  Pain in left ankle and joints of left foot  Pain in right ankle and joints of right foot  Chronic bilateral low  back pain with left-sided sciatica     Problem List There are no active problems to display for this patient.   [name] ,PT, DPT, E-RYT  01/03/2018, 5:54 PM  [place] MAIN [facility] SERVICES [address] [place], [place], [address] Phone: [phone]  Fax:  [phone]  Name: Carolyn Gregory MRN: [id number] Date of Birth: [birth year]

## 2018-01-06 ENCOUNTER — Encounter: Payer: Non-veteran care | Admitting: Physical Therapy

## 2018-01-07 ENCOUNTER — Ambulatory Visit: Payer: No Typology Code available for payment source | Attending: Internal Medicine

## 2018-01-07 ENCOUNTER — Other Ambulatory Visit: Payer: Self-pay

## 2018-01-07 DIAGNOSIS — M791 Myalgia, unspecified site: Secondary | ICD-10-CM | POA: Insufficient documentation

## 2018-01-07 DIAGNOSIS — M25571 Pain in right ankle and joints of right foot: Secondary | ICD-10-CM | POA: Insufficient documentation

## 2018-01-07 DIAGNOSIS — M25572 Pain in left ankle and joints of left foot: Secondary | ICD-10-CM | POA: Diagnosis not present

## 2018-01-07 DIAGNOSIS — R102 Pelvic and perineal pain: Secondary | ICD-10-CM | POA: Diagnosis not present

## 2018-01-07 DIAGNOSIS — G8929 Other chronic pain: Secondary | ICD-10-CM | POA: Diagnosis not present

## 2018-01-07 DIAGNOSIS — M5442 Lumbago with sciatica, left side: Secondary | ICD-10-CM | POA: Insufficient documentation

## 2018-01-07 NOTE — Therapy (Signed)
[place] MAIN [facility] SERVICES [address] [place], [place], [address] Phone: [phone]   Fax:  [phone]  Physical Therapy Treatment  Patient Details  Name: Carolyn Gregory MRN: [id number] Date of Birth: 12-07-1960 Referring Provider: Timmie Foerster    Encounter Date: 01/07/2018  PT End of Session - 01/07/18 1724    Visit Number  4    Number of Visits  15    Date for PT Re-Evaluation  02/24/18    Authorization Type  new authorization ends 9/30     PT Start Time  1105    PT Stop Time  1140    PT Time Calculation (min)  35 min       Past Medical History:  Diagnosis Date  . Anxiety   . Depression   . Hypertension     Past Surgical History:  Procedure Laterality Date  . CHOLECYSTECTOMY    . MOHS SURGERY     to remove benign growth  located in the L low abdomen  . R ankle surgery   2016   to repair lateral tendon   . VAGINAL HYSTERECTOMY      There were no vitals filed for this visit.  Subjective Assessment - 01/07/18 1722    Subjective  Pt reports LB pain and pain in B ankle/ball of foot currently    Pertinent History  Pt is interested in seeking psychotherapy/ psychiatry for managing a stress in her life regarding a family member. Pt was tearful at the beginning of session.        Enters/exits via ramp  Ambulation, blue dumbbells  Fwd 8L  Side 8L  Bkwd 6L   Active stretching at rail  hip flexors/hamstrings, 3x  LB/B flank, 3x ea                         PT Education - 01/07/18 1723    Education provided  Yes    Education Details  Properties and benefits of water as it applies to walking/exercise. Active stretching    Person(s) Educated  Patient    Methods  Explanation;Demonstration    Comprehension  Verbalized understanding;Returned demonstration          PT Long Term Goals - 12/25/17 1353      PT LONG TERM GOAL #1   Title  Pt will decrease her ODI score from 48% to < 38% in  order to return to ADLs ( 10/30: 46%, 11/30: 38%)     Time  12    Period  Weeks    Status  Achieved      PT LONG TERM GOAL #2   Title  Pt will decrease her LEFS score from   25% to > 30%  in order to walk and have less pain at her R ankle.  (11/30: 26%, 4/26: 29%, 6/19: 35%  )     Time  12    Period  Weeks    Status  Achieved      PT LONG TERM GOAL #3   Title  Pt will demo less B ankle instability on uneven ground, improved gait in order to minimize risk for injuries and falls     Time  12    Period  Weeks    Status  Revised      PT LONG TERM GOAL #4   Title  Pt will demo no pelvic obliquities and normal arthrokinematic motion of SIJ in order  to minimize radiating pain to walk longer distances    Time  4    Period  Weeks    Status  Achieved      PT LONG TERM GOAL #5   Title  Pt will demo no pelvic floor mm tensions and proper ROM in order to eliminate urine and restore pelvic floor function    Time  12    Period  Weeks    Status  Achieved      Additional Long Term Goals   Additional Long Term Goals  Yes      PT LONG TERM GOAL #6   Title  Pt will demo decreased scar restrictions over abdomen, IND with abdominal massage, and report more regular bowel movements of 1-2 days in order to restore GI health    Time  12    Period  Weeks    Status  Achieved      PT LONG TERM GOAL #7   Title  Pt will demo no tensions/ tenderness at L sacrococcygeus ligament and coccygeus mm in order to sit for > 30 min and have no pain upon standing    Time  4    Period  Weeks    Status  Achieved      PT LONG TERM GOAL #8   Title  Pt will report no pain 100% of the time with crossing L ankle over R thigh and then getting up from chair  across 1 week in order to don shoes.     Time  4    Period  Weeks    Status  Achieved      PT LONG TERM GOAL  #9   TITLE  Pt will demo no lowered position of bladder and demo Grade 3/3/3/3 in order to improve pelvic girdle and back stability to increase physical  activities without less risk for relapse    Time  8    Period  Weeks    Status  Partially Met      PT LONG TERM GOAL  #10   TITLE  Pt will increase plantarflexion reps from 10 reps B to > 20 reps B in order to climb stairs and progress towards balance training on uneven ground     Time  12    Period  Weeks    Status  New      PT LONG TERM GOAL  #11   TITLE  Pt will walk on uneven ground, moderate hills on a local nature trail for 1 mile for minimal pain < 2 /10 and no ankle sprain in order to enjoy her hobby     Time  12    Period  Weeks    Status  New    Target Date  03/19/18      PT LONG TERM GOAL  #12   TITLE  Pt will be able to fast walking ( levelled ground) for 45 min and compliance to stretching afterwards in order to  minimize risk for prolapse .      Time  8    Period  Weeks    Status  New    Target Date  02/19/18      PT LONG TERM GOAL  #13   TITLE  Pt will be able to climb stairs with proper technique , step-through pattern with one hand on rail up/ down across one flight in order to navigate community environments     Time  8  Period  Weeks    Status  New    Target Date  02/19/18      PT LONG TERM GOAL  #14   TITLE  Pt will report no calf pain with stairs up and down or  after walking at a store for 15 min  in order to  progress towards dynamic balance exercises     Time  4    Period  Weeks    Status  New    Target Date  01/22/18      PT LONG TERM GOAL  #15   TITLE  Pt will demo proper alignment and modifications with yoga to integrate into commuinity yoga classess with less risk for relapse of Sx     Time  12    Period  Weeks    Status  New    Target Date  03/19/18            Plan - 01/07/18 1725    Clinical Impression Statement  Pt with noted guarded movement patterns initially with each walking position, but improves ease and quality with continued work and some cues. Noted to relax into good pattern/technique with conversation versus over focus.  Pt notes decreased pain throughout back and feet in chest deep water. Continue to progress as tolerated focusing on core stabilization and conservative approach to strengthening.     Rehab Potential  Good    PT Frequency  1x / week    PT Duration  12 weeks    PT Treatment/Interventions  Functional mobility training;Stair training;Gait training;Patient/family education;Aquatic Therapy;Therapeutic activities;Manual lymph drainage;Moist Heat;Neuromuscular re-education;Therapeutic exercise;Manual techniques;Taping;Energy conservation    Consulted and Agree with Plan of Care  Patient       Patient will benefit from skilled therapeutic intervention in order to improve the following deficits and impairments:  Abnormal gait, Pain, Increased fascial restricitons, Decreased strength, Decreased scar mobility, Increased muscle spasms, Difficulty walking, Decreased mobility, Decreased range of motion, Decreased balance, Decreased safety awareness, Decreased coordination, Postural dysfunction, Improper body mechanics, Decreased endurance  Visit Diagnosis: Myalgia  Pain in left ankle and joints of left foot  Pain in right ankle and joints of right foot  Chronic 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   Larae Grooms 01/07/2018, 5:30 PM  [place] MAIN [facility] SERVICES [address] [place], [place], [address] Phone: [phone]   Fax:  [phone]  Name: SHAMONA WIRTZ MRN: [id number] Date of Birth: 05/13/1961

## 2018-01-08 ENCOUNTER — Ambulatory Visit: Payer: No Typology Code available for payment source | Admitting: Physical Therapy

## 2018-01-08 DIAGNOSIS — M25572 Pain in left ankle and joints of left foot: Secondary | ICD-10-CM

## 2018-01-08 DIAGNOSIS — R102 Pelvic and perineal pain: Secondary | ICD-10-CM | POA: Diagnosis not present

## 2018-01-08 DIAGNOSIS — M25571 Pain in right ankle and joints of right foot: Secondary | ICD-10-CM

## 2018-01-08 DIAGNOSIS — G8929 Other chronic pain: Secondary | ICD-10-CM

## 2018-01-08 DIAGNOSIS — M5442 Lumbago with sciatica, left side: Secondary | ICD-10-CM

## 2018-01-08 DIAGNOSIS — M791 Myalgia, unspecified site: Secondary | ICD-10-CM

## 2018-01-08 NOTE — Patient Instructions (Signed)
Ankle stability   3 min ( seated Nordic Track exercise)  Sheet under feet, sliding all four corners of feet forward and back, alternating feet Ankles behind knees \ knees tracking in 2-3rd toes. To prevent dropping arch    _________  Heel raises and lower slow keeping both sides of ankle levelled  10 x 3   _________  Walking with wider width of feet under hip sockets  Shoulder forward slightly,  Planting mid./foot down with knees tracking in 2-3rd toes.

## 2018-01-08 NOTE — Therapy (Addendum)
[place] MAIN [facility] SERVICES [address] [place], [place], [address] Phone: [phone]   Fax:  [phone]  Physical Therapy Treatment  Patient Details  Name: Carolyn Gregory MRN: [id number] Date of Birth: [birth year] Referring Provider: Timmie Foerster    Encounter Date: 01/08/2018  PT End of Session - 01/08/18 1017    Visit Number  5    Number of Visits  15    Date for PT Re-Evaluation  02/24/18    Authorization Type  new authorization ends 9/30     PT Start Time  2119    PT Stop Time  1108    PT Time Calculation (min)  53 min       Past Medical History:  Diagnosis Date  . Anxiety   . Depression   . Hypertension     Past Surgical History:  Procedure Laterality Date  . CHOLECYSTECTOMY    . MOHS SURGERY     to remove benign growth  located in the L low abdomen  . R ankle surgery   2016   to repair lateral tendon   . VAGINAL HYSTERECTOMY      There were no vitals filed for this visit.  Subjective Assessment - 01/08/18 1017    Subjective  There is no change at the ankle/arch of the feet and LBP since last session. Pt enjoyed the aquatic session and she is confident that it will be beneficial to her as it puts less pressure in her feet, ankles, knees, and back    Pertinent History  Pt is interested in seeking psychotherapy/ psychiatry for managing a stress in her life regarding a family member. Pt was tearful at the beginning of session.           [facility] PT Assessment - 01/08/18 1106      Ambulation/Gait   Gait Comments  pre-training, naroow BOS, shoudlers posterior to COM.  After cues: pt demo'd correct mechanics w/ less pronation and genu valgus                    [facility] Adult PT Treatment/Exercise - 01/08/18 1106      Therapeutic Activites    Therapeutic Activities  -- see pt instructions      Neuro Re-ed    Neuro Re-ed Details   see pt instructions      Manual Therapy   Manual therapy comments   distraction at talocrural, AP mob medial glide at mid foot joints R              PT Education - 01/08/18 1113    Education provided  Yes    Education Details  HEP, anatomy/ physiology feet mechanics     Person(s) Educated  Patient    Methods  Explanation          PT Long Term Goals - 01/08/18 1107      PT LONG TERM GOAL #1   Title  Pt will decrease her ODI score from 48% to < 38% in order to return to ADLs ( 10/30: 46%, 11/30: 38%)     Time  12    Period  Weeks    Status  Achieved      PT LONG TERM GOAL #2   Title  Pt will decrease her LEFS score from   25% to > 30%  in order to walk and have less pain at her R ankle.  (11/30: 26%, 4/26: 29%, 6/19: 35%  )  Time  12    Period  Weeks    Status  Achieved      PT LONG TERM GOAL #3   Title  Pt will demo less B ankle instability on uneven ground, improved gait in order to minimize risk for injuries and falls     Time  12    Period  Weeks    Status  Revised      PT LONG TERM GOAL #4   Title  Pt will demo no pelvic obliquities and normal arthrokinematic motion of SIJ in order to minimize radiating pain to walk longer distances    Time  4    Period  Weeks    Status  Achieved      PT LONG TERM GOAL #5   Title  Pt will demo no pelvic floor mm tensions and proper ROM in order to eliminate urine and restore pelvic floor function    Time  12    Period  Weeks    Status  Achieved      PT LONG TERM GOAL #6   Title  Pt will demo decreased scar restrictions over abdomen, IND with abdominal massage, and report more regular bowel movements of 1-2 days in order to restore GI health    Time  12    Period  Weeks    Status  Achieved      PT LONG TERM GOAL #7   Title  Pt will demo no tensions/ tenderness at L sacrococcygeus ligament and coccygeus mm in order to sit for > 30 min and have no pain upon standing    Time  4    Period  Weeks    Status  Achieved      PT LONG TERM GOAL #8   Title  Pt will report no pain 100% of the  time with crossing L ankle over R thigh and then getting up from chair  across 1 week in order to don shoes.     Time  4    Period  Weeks    Status  Achieved      PT LONG TERM GOAL  #9   TITLE  Pt will demo no lowered position of bladder and demo Grade 3/3/3/3 in order to improve pelvic girdle and back stability to increase physical activities without less risk for relapse    Time  8    Period  Weeks    Status  Partially Met      PT LONG TERM GOAL  #10   TITLE  Pt will increase plantarflexion reps from 10 reps B to > 20 reps B in order to climb stairs and progress towards balance training on uneven ground     Time  12    Period  Weeks    Status  New      PT LONG TERM GOAL  #11   TITLE  Pt will walk on uneven ground, moderate hills on a local nature trail for 1 mile for minimal pain < 2 /10 and no ankle sprain in order to enjoy her hobby     Time  12    Period  Weeks    Status  New      PT LONG TERM GOAL  #12   TITLE  Pt will be able to fast walking ( levelled ground) for 45 min and compliance to stretching afterwards in order to  minimize risk for prolapse .      Time  8  Period  Weeks    Status  New      PT LONG TERM GOAL  #13   TITLE  Pt will be able to climb stairs with proper technique , step-through pattern with one hand on rail up/ down across one flight in order to navigate community environments     Time  8    Period  Weeks    Status  New      PT LONG TERM GOAL  #14   TITLE  Pt will report no calf pain with stairs up and down or  after walking at a store for 15 min  in order to  progress towards dynamic balance exercises     Time  4    Period  Weeks    Status  New      PT LONG TERM GOAL  #15   TITLE  Pt will demo proper alignment and modifications with yoga to integrate into commuinity yoga classess with less risk for relapse of Sx     Time  12    Period  Weeks    Status  New            Plan - 01/08/18 1053    Clinical Impression Statement  Pt reported  feeling less tenderness/pain ( from 8/10 to 2/10) at lateral malleoli and longitudinal arch at navicular joint post Tx. Pt reported feeling that getting her foot addressed finally has lifted a lot of weight from her psyche because in the past, she had not received Tx for her R foot after her post surgery  ( tendon debridement ). Gait training progressed to neuromuscular re-education for more anterior COM, stronger push up and more ankle stability in stance phase. Pt showed significantly less pronation and genu valgus after training and Tx. Pt continues to benefit from skilled PT.     Rehab Potential  Good    PT Frequency  2x / week    PT Duration  9 weeks    PT Treatment/Interventions  Functional mobility training;Stair training;Gait training;Patient/family education;Aquatic Therapy;Therapeutic activities;Manual lymph drainage;Moist Heat;Neuromuscular re-education;Therapeutic exercise;Manual techniques;Taping;Energy conservation    Consulted and Agree with Plan of Care  Patient       Patient will benefit from skilled therapeutic intervention in order to improve the following deficits and impairments:  Abnormal gait, Pain, Increased fascial restricitons, Decreased strength, Decreased scar mobility, Increased muscle spasms, Difficulty walking, Decreased mobility, Decreased range of motion, Decreased balance, Decreased safety awareness, Decreased coordination, Postural dysfunction, Improper body mechanics, Decreased endurance  Visit Diagnosis: Myalgia  Pain in right ankle and joints of right foot  Chronic bilateral low back pain with left-sided sciatica  Pain in left ankle and joints of left foot     Problem List There are no active problems to display for this patient.   [name] ,PT, DPT, E-RYT  01/08/2018, 11:13 AM  [place] MAIN [facility] SERVICES [address] [place], [place], [address] Phone: [phone]   Fax:  [phone]  Name:  Carolyn Gregory MRN: [id number] Date of Birth: Apr 18, 1961

## 2018-01-13 ENCOUNTER — Encounter: Payer: Non-veteran care | Admitting: Physical Therapy

## 2018-01-15 ENCOUNTER — Ambulatory Visit: Payer: No Typology Code available for payment source | Admitting: Physical Therapy

## 2018-01-15 ENCOUNTER — Encounter: Payer: Non-veteran care | Admitting: Physical Therapy

## 2018-01-16 ENCOUNTER — Other Ambulatory Visit: Payer: Self-pay

## 2018-01-16 ENCOUNTER — Ambulatory Visit: Payer: No Typology Code available for payment source

## 2018-01-16 DIAGNOSIS — M25571 Pain in right ankle and joints of right foot: Secondary | ICD-10-CM

## 2018-01-16 DIAGNOSIS — M5442 Lumbago with sciatica, left side: Secondary | ICD-10-CM

## 2018-01-16 DIAGNOSIS — M791 Myalgia, unspecified site: Secondary | ICD-10-CM

## 2018-01-16 DIAGNOSIS — M25572 Pain in left ankle and joints of left foot: Secondary | ICD-10-CM

## 2018-01-16 DIAGNOSIS — R102 Pelvic and perineal pain: Secondary | ICD-10-CM | POA: Diagnosis not present

## 2018-01-16 DIAGNOSIS — G8929 Other chronic pain: Secondary | ICD-10-CM

## 2018-01-16 NOTE — Therapy (Signed)
[place] MAIN [facility] SERVICES [address] [place], [place], [address] Phone: [phone]   Fax:  [phone]  Physical Therapy Treatment  Patient Details  Name: Carolyn Gregory MRN: [id number] Date of Birth: 08/19/1960 Referring Provider: Timmie Foerster    Encounter Date: 01/16/2018  PT End of Session - 01/16/18 1649    Visit Number  6    Number of Visits  15    Date for PT Re-Evaluation  02/24/18    Authorization Type  new authorization ends 9/30     PT Start Time  0900    PT Stop Time  0945    PT Time Calculation (min)  45 min    Activity Tolerance  Patient tolerated treatment well;No increased pain    Behavior During Therapy  WFL for tasks assessed/performed       Past Medical History:  Diagnosis Date  . Anxiety   . Depression   . Hypertension     Past Surgical History:  Procedure Laterality Date  . CHOLECYSTECTOMY    . MOHS SURGERY     to remove benign growth  located in the L low abdomen  . R ankle surgery   2016   to repair lateral tendon   . VAGINAL HYSTERECTOMY      There were no vitals filed for this visit.  Subjective Assessment - 01/16/18 1646    Subjective  Pt continues with tightness/pain in B feet R > L, 8/10 at worst; in water decreases to 6. Also notes some L sciatica, which pt states causes some tightening/fatigue in upper back musculature between shoulder blades; current today    Pertinent History  Pt is interested in seeking psychotherapy/ psychiatry for managing a stress in her life regarding a family member. Pt was tearful at the beginning of session.        Enters/exits via ramp  Ambulation, blue dumbbells  Fwd 4L  Side 4 L  Side with squat 2 L  Bench  Recumbent bike, 2 min  Bicycle, 5 min (use ankle DF/PF)  Stand wall, shoulders submerged  Bkwd shoulder rolls, 20x (slow)  Single fiigure 8 (elephant trunk) upper back stretch, B 20x                           PT  Education - 01/16/18 1648    Education provided  Yes    Education Details  side step with squat, recumbent and traditional bike on bench, figure 8 (elephant trunk stretch) for upper back. Use of frozen water bottle with pillow case for rolling massage/cooling to plantar foot          PT Long Term Goals - 01/08/18 1107      PT LONG TERM GOAL #1   Title  Pt will decrease her ODI score from 48% to < 38% in order to return to ADLs ( 10/30: 46%, 11/30: 38%)     Time  12    Period  Weeks    Status  Achieved      PT LONG TERM GOAL #2   Title  Pt will decrease her LEFS score from   25% to > 30%  in order to walk and have less pain at her R ankle.  (11/30: 26%, 4/26: 29%, 6/19: 35%  )     Time  12    Period  Weeks    Status  Achieved      PT LONG  TERM GOAL #3   Title  Pt will demo less B ankle instability on uneven ground, improved gait in order to minimize risk for injuries and falls     Time  12    Period  Weeks    Status  Revised      PT LONG TERM GOAL #4   Title  Pt will demo no pelvic obliquities and normal arthrokinematic motion of SIJ in order to minimize radiating pain to walk longer distances    Time  4    Period  Weeks    Status  Achieved      PT LONG TERM GOAL #5   Title  Pt will demo no pelvic floor mm tensions and proper ROM in order to eliminate urine and restore pelvic floor function    Time  12    Period  Weeks    Status  Achieved      PT LONG TERM GOAL #6   Title  Pt will demo decreased scar restrictions over abdomen, IND with abdominal massage, and report more regular bowel movements of 1-2 days in order to restore GI health    Time  12    Period  Weeks    Status  Achieved      PT LONG TERM GOAL #7   Title  Pt will demo no tensions/ tenderness at L sacrococcygeus ligament and coccygeus mm in order to sit for > 30 min and have no pain upon standing    Time  4    Period  Weeks    Status  Achieved      PT LONG TERM GOAL #8   Title  Pt will report no pain  100% of the time with crossing L ankle over R thigh and then getting up from chair  across 1 week in order to don shoes.     Time  4    Period  Weeks    Status  Achieved      PT LONG TERM GOAL  #9   TITLE  Pt will demo no lowered position of bladder and demo Grade 3/3/3/3 in order to improve pelvic girdle and back stability to increase physical activities without less risk for relapse    Time  8    Period  Weeks    Status  Partially Met      PT LONG TERM GOAL  #10   TITLE  Pt will increase plantarflexion reps from 10 reps B to > 20 reps B in order to climb stairs and progress towards balance training on uneven ground     Time  12    Period  Weeks    Status  New      PT LONG TERM GOAL  #11   TITLE  Pt will walk on uneven ground, moderate hills on a local nature trail for 1 mile for minimal pain < 2 /10 and no ankle sprain in order to enjoy her hobby     Time  12    Period  Weeks    Status  New      PT LONG TERM GOAL  #12   TITLE  Pt will be able to fast walking ( levelled ground) for 45 min and compliance to stretching afterwards in order to  minimize risk for prolapse .      Time  8    Period  Weeks    Status  New      PT LONG TERM GOAL  #  13   TITLE  Pt will be able to climb stairs with proper technique , step-through pattern with one hand on rail up/ down across one flight in order to navigate community environments     Time  8    Period  Weeks    Status  New      PT LONG TERM GOAL  #14   TITLE  Pt will report no calf pain with stairs up and down or  after walking at a store for 15 min  in order to  progress towards dynamic balance exercises     Time  4    Period  Weeks    Status  New      PT LONG TERM GOAL  #15   TITLE  Pt will demo proper alignment and modifications with yoga to integrate into commuinity yoga classess with less risk for relapse of Sx     Time  12    Period  Weeks    Status  New            Plan - 01/16/18 1650    Clinical Impression Statement   pt tolerated increase of activity and session time well. Demonstrates good awareness of core control post some additional cueing with side step squat exercises. Demonstrating improved technique with cycling movement with ankle DF/PF action. Continue to progress strength and endurance throughout body and provide pain relief.     Rehab Potential  Good    PT Frequency  1x / week    PT Duration  12 weeks    PT Treatment/Interventions  Functional mobility training;Stair training;Gait training;Patient/family education;Aquatic Therapy;Therapeutic activities;Manual lymph drainage;Moist Heat;Neuromuscular re-education;Therapeutic exercise;Manual techniques;Taping;Energy conservation    Consulted and Agree with Plan of Care  Patient       Patient will benefit from skilled therapeutic intervention in order to improve the following deficits and impairments:  Abnormal gait, Pain, Increased fascial restricitons, Decreased strength, Decreased scar mobility, Increased muscle spasms, Difficulty walking, Decreased mobility, Decreased range of motion, Decreased balance, Decreased safety awareness, Decreased coordination, Postural dysfunction, Improper body mechanics, Decreased endurance  Visit Diagnosis: Myalgia  Pain in right ankle and joints of right foot  Chronic bilateral low back pain with left-sided sciatica  Pain in left ankle and joints of left foot     Problem List There are no active problems to display for this patient.   Larae Grooms 01/16/2018, 4:53 PM  [place] MAIN [facility] SERVICES [address] [place], [place], [address] Phone: [phone]   Fax:  [phone]  Name: Carolyn Gregory MRN: [id number] Date of Birth: 03-14-1961

## 2018-01-20 ENCOUNTER — Ambulatory Visit: Payer: No Typology Code available for payment source | Admitting: Physical Therapy

## 2018-01-20 DIAGNOSIS — G8929 Other chronic pain: Secondary | ICD-10-CM

## 2018-01-20 DIAGNOSIS — M25571 Pain in right ankle and joints of right foot: Secondary | ICD-10-CM

## 2018-01-20 DIAGNOSIS — M25572 Pain in left ankle and joints of left foot: Secondary | ICD-10-CM

## 2018-01-20 DIAGNOSIS — M5442 Lumbago with sciatica, left side: Secondary | ICD-10-CM

## 2018-01-20 DIAGNOSIS — M791 Myalgia, unspecified site: Secondary | ICD-10-CM

## 2018-01-20 DIAGNOSIS — R102 Pelvic and perineal pain: Secondary | ICD-10-CM | POA: Diagnosis not present

## 2018-01-20 NOTE — Patient Instructions (Addendum)
Feet care :  Self -feet massage   Handshake : fingers between toes, moving ballmounds/toes back and forth several times while other hand anchors at arch. Do the same at the hind/mid foot.  Heel to toes upward to a letter Big Letter T strokes to spread ballmounds and toes, several times, pinch between webs of toes  Run finger tips along top of foot between long bones "comb between the bones"    Wiggle toes and spread them out when relaxing   _ Wear the pedicure toe separaters for 20-30 min while watching  X 2 -3

## 2018-01-20 NOTE — Therapy (Addendum)
[place] MAIN [facility] SERVICES [address] [place], [place], [address] Phone: [phone]   Fax:  [phone]  Physical Therapy Treatment  Patient Details  Name: Carolyn Gregory MRN: [id number] Date of Birth: [birth year] Referring Provider: Timmie Foerster    Encounter Date: 01/20/2018  PT End of Session - 01/20/18 1359    Visit Number  7    Number of Visits  15    Date for PT Re-Evaluation  02/24/18    Authorization Type  new authorization ends 9/30     PT Start Time  3235    PT Stop Time  1359    PT Time Calculation (min)  54 min    Activity Tolerance  Patient tolerated treatment well;No increased pain    Behavior During Therapy  WFL for tasks assessed/performed       Past Medical History:  Diagnosis Date  . Anxiety   . Depression   . Hypertension     Past Surgical History:  Procedure Laterality Date  . CHOLECYSTECTOMY    . MOHS SURGERY     to remove benign growth  located in the L low abdomen  . R ankle surgery   2016   to repair lateral tendon   . VAGINAL HYSTERECTOMY      There were no vitals filed for this visit.  Subjective Assessment - 01/20/18 1307    Subjective  Pt feels the water therapy has been helping. Being in the water takes the weight off. For a long period of time, she was limping, she is trying to relearn how to walk again. There is still swelling in teh R ankle. The pain is an ache that goes around the outside of the R ankle which has increased. She had experienced this ache back 8 months ago.      Pertinent History  Pt is interested in seeking psychotherapy/ psychiatry for managing a stress in her life regarding a family member. Pt was tearful at the beginning of session.           [facility] PT Assessment - 01/20/18 1357      Palpation   Palpation comment  increased tensionsa t adductor hallucis, plantarfascia, hypomobility at rays B                     [facility] Adult PT Treatment/Exercise -  01/20/18 1357      Neuro Re-ed    Neuro Re-ed Details   see pt instructions      Manual Therapy   Manual therapy comments  distraction at talocrural joint, STM, AP.PA mob Grade II at midfoot joints, rays, B              PT Education - 01/20/18 1359    Education provided  Yes    Education Details  HEP    Person(s) Educated  Patient    Methods  Explanation;Demonstration;Tactile cues;Verbal cues;Handout    Comprehension  Returned demonstration;Verbalized understanding          PT Long Term Goals - 01/08/18 1107      PT LONG TERM GOAL #1   Title  Pt will decrease her ODI score from 48% to < 38% in order to return to ADLs ( 10/30: 46%, 11/30: 38%)     Time  12    Period  Weeks    Status  Achieved      PT LONG TERM GOAL #2   Title  Pt will decrease her  LEFS score from   25% to > 30%  in order to walk and have less pain at her R ankle.  (11/30: 26%, 4/26: 29%, 6/19: 35%  )     Time  12    Period  Weeks    Status  Achieved      PT LONG TERM GOAL #3   Title  Pt will demo less B ankle instability on uneven ground, improved gait in order to minimize risk for injuries and falls     Time  12    Period  Weeks    Status  Revised      PT LONG TERM GOAL #4   Title  Pt will demo no pelvic obliquities and normal arthrokinematic motion of SIJ in order to minimize radiating pain to walk longer distances    Time  4    Period  Weeks    Status  Achieved      PT LONG TERM GOAL #5   Title  Pt will demo no pelvic floor mm tensions and proper ROM in order to eliminate urine and restore pelvic floor function    Time  12    Period  Weeks    Status  Achieved      PT LONG TERM GOAL #6   Title  Pt will demo decreased scar restrictions over abdomen, IND with abdominal massage, and report more regular bowel movements of 1-2 days in order to restore GI health    Time  12    Period  Weeks    Status  Achieved      PT LONG TERM GOAL #7   Title  Pt will demo no tensions/ tenderness at L  sacrococcygeus ligament and coccygeus mm in order to sit for > 30 min and have no pain upon standing    Time  4    Period  Weeks    Status  Achieved      PT LONG TERM GOAL #8   Title  Pt will report no pain 100% of the time with crossing L ankle over R thigh and then getting up from chair  across 1 week in order to don shoes.     Time  4    Period  Weeks    Status  Achieved      PT LONG TERM GOAL  #9   TITLE  Pt will demo no lowered position of bladder and demo Grade 3/3/3/3 in order to improve pelvic girdle and back stability to increase physical activities without less risk for relapse    Time  8    Period  Weeks    Status  Partially Met      PT LONG TERM GOAL  #10   TITLE  Pt will increase plantarflexion reps from 10 reps B to > 20 reps B in order to climb stairs and progress towards balance training on uneven ground     Time  12    Period  Weeks    Status  New      PT LONG TERM GOAL  #11   TITLE  Pt will walk on uneven ground, moderate hills on a local nature trail for 1 mile for minimal pain < 2 /10 and no ankle sprain in order to enjoy her hobby     Time  12    Period  Weeks    Status  New      PT LONG TERM GOAL  #12   TITLE  Pt will be able to fast walking ( levelled ground) for 45 min and compliance to stretching afterwards in order to  minimize risk for prolapse .      Time  8    Period  Weeks    Status  New      PT LONG TERM GOAL  #13   TITLE  Pt will be able to climb stairs with proper technique , step-through pattern with one hand on rail up/ down across one flight in order to navigate community environments     Time  8    Period  Weeks    Status  New      PT LONG TERM GOAL  #14   TITLE  Pt will report no calf pain with stairs up and down or  after walking at a store for 15 min  in order to  progress towards dynamic balance exercises     Time  4    Period  Weeks    Status  New      PT LONG TERM GOAL  #15   TITLE  Pt will demo proper alignment and  modifications with yoga to integrate into commuinity yoga classess with less risk for relapse of Sx     Time  12    Period  Weeks    Status  New            Plan - 01/20/18 1359    Clinical Impression Statement  Pt toelrated today's manual Tx on B feet without complaints. Pt reported she was feeling better and no pain when extending toes into plantarflexion in seated position. Manual Tx helped to facilitate increased mobility at rays, midfoot and abduction of transverse arches. Pool therapy continues to be helpful for pt.   Pt's R glut pain has been minimal . Pt continues to benefit from skilled PT.     Rehab Potential  Good    PT Frequency  2x / week    PT Duration  12 weeks    PT Treatment/Interventions  Functional mobility training;Stair training;Gait training;Patient/family education;Aquatic Therapy;Therapeutic activities;Manual lymph drainage;Moist Heat;Neuromuscular re-education;Therapeutic exercise;Manual techniques;Taping;Energy conservation    Consulted and Agree with Plan of Care  Patient       Patient will benefit from skilled therapeutic intervention in order to improve the following deficits and impairments:  Abnormal gait, Pain, Increased fascial restricitons, Decreased strength, Decreased scar mobility, Increased muscle spasms, Difficulty walking, Decreased mobility, Decreased range of motion, Decreased balance, Decreased safety awareness, Decreased coordination, Postural dysfunction, Improper body mechanics, Decreased endurance  Visit Diagnosis: Myalgia  Pain in right ankle and joints of right foot  Chronic bilateral low back pain with left-sided sciatica  Pain in left ankle and joints of left foot     Problem List There are no active problems to display for this patient.   [name] ,PT, DPT, E-RYT  01/20/2018, 2:02 PM  [place] MAIN [facility] SERVICES [address] [place], [place], [address] Phone: [phone]    Fax:  [phone]  Name: PRESLIE DEPASQUALE MRN: [id number] Date of Birth: 1961/05/20

## 2018-01-21 ENCOUNTER — Ambulatory Visit: Payer: No Typology Code available for payment source

## 2018-01-21 ENCOUNTER — Other Ambulatory Visit: Payer: Self-pay

## 2018-01-21 DIAGNOSIS — M791 Myalgia, unspecified site: Secondary | ICD-10-CM

## 2018-01-21 DIAGNOSIS — M5442 Lumbago with sciatica, left side: Secondary | ICD-10-CM

## 2018-01-21 DIAGNOSIS — G8929 Other chronic pain: Secondary | ICD-10-CM

## 2018-01-21 DIAGNOSIS — M25571 Pain in right ankle and joints of right foot: Secondary | ICD-10-CM

## 2018-01-21 DIAGNOSIS — M25572 Pain in left ankle and joints of left foot: Secondary | ICD-10-CM

## 2018-01-21 DIAGNOSIS — R102 Pelvic and perineal pain: Secondary | ICD-10-CM | POA: Diagnosis not present

## 2018-01-21 NOTE — Therapy (Signed)
[place] MAIN [facility] SERVICES [address] [place], [place], [address] Phone: [phone]   Fax:  [phone]  Physical Therapy Treatment  Patient Details  Name: Carolyn Gregory MRN: [id number] Date of Birth: [birth year] Referring Provider: Timmie Foerster    Encounter Date: 01/21/2018  PT End of Session - 01/21/18 1543    Visit Number  8    Number of Visits  15    Date for PT Re-Evaluation  02/24/18    Authorization Type  new authorization ends 9/30     PT Start Time  1125    PT Stop Time  1210    PT Time Calculation (min)  45 min    Activity Tolerance  Patient tolerated treatment well;No increased pain    Behavior During Therapy  WFL for tasks assessed/performed       Past Medical History:  Diagnosis Date  . Anxiety   . Depression   . Hypertension     Past Surgical History:  Procedure Laterality Date  . CHOLECYSTECTOMY    . MOHS SURGERY     to remove benign growth  located in the L low abdomen  . R ankle surgery   2016   to repair lateral tendon   . VAGINAL HYSTERECTOMY      There were no vitals filed for this visit.  Subjective Assessment - 01/21/18 1541    Subjective  Pt denies pain, but reports tightness in B feet/toes and instability in B ankles, L > R.     Pertinent History  Pt is interested in seeking psychotherapy/ psychiatry for managing a stress in her life regarding a family member. Pt was tearful at the beginning of session.           [facility] PT Assessment - 01/20/18 1357      Palpation   Palpation comment  increased tensionsa t adductor hallucis, plantarfascia, hypomobility at rays B        Enters/exits via ramp  Ambulation, supernoodle  6 L fwd   6 L side  6 L side with squat  Strength/balance  Side stepping to/from lunges, B 20x ea  Fwd stepping to/from lunges, B 20x ea  Balance/posutre/improved use of foot/ankle  Yoga basic stance  Active gastroc stretching, B, 4x  ea              [facility] Adult PT Treatment/Exercise - 01/20/18 1357      Neuro Re-ed    Neuro Re-ed Details   see pt instructions      Exercises   Exercises  -- see pt instructions      Manual Therapy   Manual therapy comments  distraction at talocrural joint, STM, AP.PA mob Grade II at midfoot joints, rays, B              PT Education - 01/21/18 1541    Education provided  Yes    Education Details  Choice of shoe wear to prevent gripping with toes. Yoga basic stance. Fwd/side lunges. Active gastroc stretching    Person(s) Educated  Patient    Methods  Explanation;Demonstration    Comprehension  Verbalized understanding;Returned demonstration;Verbal cues required          PT Long Term Goals - 01/08/18 1107      PT LONG TERM GOAL #1   Title  Pt will decrease her ODI score from 48% to < 38% in order to return to ADLs ( 10/30: 46%, 11/30: 38%)  Time  12    Period  Weeks    Status  Achieved      PT LONG TERM GOAL #2   Title  Pt will decrease her LEFS score from   25% to > 30%  in order to walk and have less pain at her R ankle.  (11/30: 26%, 4/26: 29%, 6/19: 35%  )     Time  12    Period  Weeks    Status  Achieved      PT LONG TERM GOAL #3   Title  Pt will demo less B ankle instability on uneven ground, improved gait in order to minimize risk for injuries and falls     Time  12    Period  Weeks    Status  Revised      PT LONG TERM GOAL #4   Title  Pt will demo no pelvic obliquities and normal arthrokinematic motion of SIJ in order to minimize radiating pain to walk longer distances    Time  4    Period  Weeks    Status  Achieved      PT LONG TERM GOAL #5   Title  Pt will demo no pelvic floor mm tensions and proper ROM in order to eliminate urine and restore pelvic floor function    Time  12    Period  Weeks    Status  Achieved      PT LONG TERM GOAL #6   Title  Pt will demo decreased scar restrictions over abdomen, IND with abdominal massage,  and report more regular bowel movements of 1-2 days in order to restore GI health    Time  12    Period  Weeks    Status  Achieved      PT LONG TERM GOAL #7   Title  Pt will demo no tensions/ tenderness at L sacrococcygeus ligament and coccygeus mm in order to sit for > 30 min and have no pain upon standing    Time  4    Period  Weeks    Status  Achieved      PT LONG TERM GOAL #8   Title  Pt will report no pain 100% of the time with crossing L ankle over R thigh and then getting up from chair  across 1 week in order to don shoes.     Time  4    Period  Weeks    Status  Achieved      PT LONG TERM GOAL  #9   TITLE  Pt will demo no lowered position of bladder and demo Grade 3/3/3/3 in order to improve pelvic girdle and back stability to increase physical activities without less risk for relapse    Time  8    Period  Weeks    Status  Partially Met      PT LONG TERM GOAL  #10   TITLE  Pt will increase plantarflexion reps from 10 reps B to > 20 reps B in order to climb stairs and progress towards balance training on uneven ground     Time  12    Period  Weeks    Status  New      PT LONG TERM GOAL  #11   TITLE  Pt will walk on uneven ground, moderate hills on a local nature trail for 1 mile for minimal pain < 2 /10 and no ankle sprain in order to enjoy her hobby  Time  12    Period  Weeks    Status  New      PT LONG TERM GOAL  #12   TITLE  Pt will be able to fast walking ( levelled ground) for 45 min and compliance to stretching afterwards in order to  minimize risk for prolapse .      Time  8    Period  Weeks    Status  New      PT LONG TERM GOAL  #13   TITLE  Pt will be able to climb stairs with proper technique , step-through pattern with one hand on rail up/ down across one flight in order to navigate community environments     Time  8    Period  Weeks    Status  New      PT LONG TERM GOAL  #14   TITLE  Pt will report no calf pain with stairs up and down or  after  walking at a store for 15 min  in order to  progress towards dynamic balance exercises     Time  4    Period  Weeks    Status  New      PT LONG TERM GOAL  #15   TITLE  Pt will demo proper alignment and modifications with yoga to integrate into commuinity yoga classess with less risk for relapse of Sx     Time  12    Period  Weeks    Status  New            Plan - 01/21/18 1543    Clinical Impression Statement  Pt tolerated well. Added dynamic balance/strength work this session. Improving ambulation speed, stability and confidence. Continue to progress, all areas.     Rehab Potential  Good    PT Frequency  1x / week    PT Duration  12 weeks    PT Treatment/Interventions  Functional mobility training;Stair training;Gait training;Patient/family education;Aquatic Therapy;Therapeutic activities;Manual lymph drainage;Moist Heat;Neuromuscular re-education;Therapeutic exercise;Manual techniques;Taping;Energy conservation    Consulted and Agree with Plan of Care  Patient       Patient will benefit from skilled therapeutic intervention in order to improve the following deficits and impairments:  Abnormal gait, Pain, Increased fascial restricitons, Decreased strength, Decreased scar mobility, Increased muscle spasms, Difficulty walking, Decreased mobility, Decreased range of motion, Decreased balance, Decreased safety awareness, Decreased coordination, Postural dysfunction, Improper body mechanics, Decreased endurance  Visit Diagnosis: Myalgia  Pain in right ankle and joints of right foot  Chronic bilateral low back pain with left-sided sciatica  Pain in left ankle and joints of left foot     Problem List There are no active problems to display for this patient.   Larae Grooms 01/21/2018, 3:45 PM  [place] MAIN [facility] SERVICES [address] [place], [place], [address] Phone: [phone]   Fax:  [phone]  Name: Carolyn Gregory MRN:  [id number] Date of Birth: 1961-06-24

## 2018-01-22 ENCOUNTER — Encounter: Payer: Non-veteran care | Admitting: Physical Therapy

## 2018-01-23 ENCOUNTER — Ambulatory Visit: Payer: No Typology Code available for payment source | Admitting: Physical Therapy

## 2018-01-23 DIAGNOSIS — M5442 Lumbago with sciatica, left side: Secondary | ICD-10-CM

## 2018-01-23 DIAGNOSIS — M25572 Pain in left ankle and joints of left foot: Secondary | ICD-10-CM

## 2018-01-23 DIAGNOSIS — R102 Pelvic and perineal pain: Secondary | ICD-10-CM | POA: Diagnosis not present

## 2018-01-23 DIAGNOSIS — G8929 Other chronic pain: Secondary | ICD-10-CM

## 2018-01-23 DIAGNOSIS — M25571 Pain in right ankle and joints of right foot: Secondary | ICD-10-CM

## 2018-01-23 DIAGNOSIS — M791 Myalgia, unspecified site: Secondary | ICD-10-CM

## 2018-01-23 NOTE — Patient Instructions (Signed)
Stretches for back/ gluts/ pelvic floor   Knee to chest  Figure -4  Cross thigh over thigh All 5 breaths    Stretch for pelvic floor   "v heels slide away and then back toward buttocks and then rock knee to slight ,  slide heel along at 11 o clock away from buttocks   10 reps       Frog stretch: laying on belly with pillow under hips to mid thigh , knees bent, inhale do nothing, exhale let ankles fall apart 45 deg   5 reps

## 2018-01-24 NOTE — Therapy (Addendum)
[place] MAIN [facility] SERVICES [address] [place], [place], [address] Phone: [phone]   Fax:  [phone]  Physical Therapy Treatment  Patient Details  Name: Carolyn Gregory MRN: [id number] Date of Birth: 1961/06/12 Referring Provider: Timmie Foerster    Encounter Date: 01/23/2018  PT End of Session - 01/23/18 1307    Visit Number  9    Number of Visits  15    Date for PT Re-Evaluation     Authorization Type  new authorization ends 9/30     PT Start Time  1214    PT Stop Time  8413    PT Time Calculation (min)  53 min    Activity Tolerance  Patient tolerated treatment well;No increased pain    Behavior During Therapy  WFL for tasks assessed/performed       Past Medical History:  Diagnosis Date  . Anxiety   . Depression   . Hypertension     Past Surgical History:  Procedure Laterality Date  . CHOLECYSTECTOMY    . MOHS SURGERY     to remove benign growth  located in the L low abdomen  . R ankle surgery   2016   to repair lateral tendon   . VAGINAL HYSTERECTOMY      There were no vitals filed for this visit.  Subjective Assessment - 01/24/18 1042    Subjective Pt reports after pool performing new exercises ( squats) , pt has had pain in her L glut with the numbness/ tingling going to knee. Pt is not able to corss her thigh over due to her pain and while she was walking, she has had pain on the bottom of her feet and had felt her knee buckle.  Pt nearly fell due to the pain with walking   Pertinent History  Pt is interested in seeking psychotherapy/ psychiatry for managing a stress in her life regarding a family member. Pt was tearful at the beginning of session.           [facility] PT Assessment - 01/24/18 1045      Palpation   SI assessment   L ASIS more anterior, FADDIR hypomobile , L LBP pain with R  hip flexio 90deg and L LBP w/ lowering leg from hip flexion      Palpation comment  L QL and paraspinal mm                  Pelvic Floor Special Questions - 01/24/18 1045    Pelvic Floor Internal Exam  pt consented verbally without contraindication     Exam Type  Vaginal    Palpation  tenderness/ tightness at obt int L, iliococcygeus     Strength  good squeeze, good lift, able to hold agaisnt strong resistance        [facility] Adult PT Treatment/Exercise - 01/24/18 1045      Modalities   Modalities  Moist Heat      Moist Heat Therapy   Number Minutes Moist Heat  5 Minutes    Moist Heat Location  -- buttock/ perineum       Manual Therapy   Manual therapy comments  AP mob and ortational mob: FADDIR, distraction at QL in sidelying with open book     Internal Pelvic Floor  STM/MWM at pelvic floor areas noted in assessment              PT Education - 01/23/18 1307    Education  provided  Yes    Education Details  HEP    Person(s) Educated  Patient    Methods  Demonstration;Explanation;Tactile cues;Verbal cues    Comprehension  Verbalized understanding;Returned demonstration          PT Long Term Goals - 01/08/18 1107      PT LONG TERM GOAL #1   Title  Pt will decrease her ODI score from 48% to < 38% in order to return to ADLs ( 10/30: 46%, 11/30: 38%)     Time  12    Period  Weeks    Status  Achieved      PT LONG TERM GOAL #2   Title  Pt will decrease her LEFS score from   25% to > 30%  in order to walk and have less pain at her R ankle.  (11/30: 26%, 4/26: 29%, 6/19: 35%  )     Time  12    Period  Weeks    Status  Achieved      PT LONG TERM GOAL #3   Title  Pt will demo less B ankle instability on uneven ground, improved gait in order to minimize risk for injuries and falls     Time  12    Period  Weeks    Status  Revised      PT LONG TERM GOAL #4   Title  Pt will demo no pelvic obliquities and normal arthrokinematic motion of SIJ in order to minimize radiating pain to walk longer distances    Time  4    Period  Weeks    Status  Achieved      PT LONG TERM  GOAL #5   Title  Pt will demo no pelvic floor mm tensions and proper ROM in order to eliminate urine and restore pelvic floor function    Time  12    Period  Weeks    Status  Achieved      PT LONG TERM GOAL #6   Title  Pt will demo decreased scar restrictions over abdomen, IND with abdominal massage, and report more regular bowel movements of 1-2 days in order to restore GI health    Time  12    Period  Weeks    Status  Achieved      PT LONG TERM GOAL #7   Title  Pt will demo no tensions/ tenderness at L sacrococcygeus ligament and coccygeus mm in order to sit for > 30 min and have no pain upon standing    Time  4    Period  Weeks    Status  Achieved      PT LONG TERM GOAL #8   Title  Pt will report no pain 100% of the time with crossing L ankle over R thigh and then getting up from chair  across 1 week in order to don shoes.     Time  4    Period  Weeks    Status  Achieved      PT LONG TERM GOAL  #9   TITLE  Pt will demo no lowered position of bladder and demo Grade 3/3/3/3 in order to improve pelvic girdle and back stability to increase physical activities without less risk for relapse    Time  8    Period  Weeks    Status  Partially Met      PT LONG TERM GOAL  #10   TITLE  Pt will increase plantarflexion reps  from 10 reps B to > 20 reps B in order to climb stairs and progress towards balance training on uneven ground     Time  12    Period  Weeks    Status  New      PT LONG TERM GOAL  #11   TITLE  Pt will walk on uneven ground, moderate hills on a local nature trail for 1 mile for minimal pain < 2 /10 and no ankle sprain in order to enjoy her hobby     Time  12    Period  Weeks    Status  New      PT LONG TERM GOAL  #12   TITLE  Pt will be able to fast walking ( levelled ground) for 45 min and compliance to stretching afterwards in order to  minimize risk for prolapse .      Time  8    Period  Weeks    Status  New      PT LONG TERM GOAL  #13   TITLE  Pt will be  able to climb stairs with proper technique , step-through pattern with one hand on rail up/ down across one flight in order to navigate community environments     Time  8    Period  Weeks    Status  New      PT LONG TERM GOAL  #14   TITLE  Pt will report no calf pain with stairs up and down or  after walking at a store for 15 min  in order to  progress towards dynamic balance exercises     Time  4    Period  Weeks    Status  New      PT LONG TERM GOAL  #15   TITLE  Pt will demo proper alignment and modifications with yoga to integrate into commuinity yoga classess with less risk for relapse of Sx     Time  12    Period  Weeks    Status  New            Plan - 01/23/18 1308    Clinical Impression Statement  Pt has a relapse of pelvic and LBP after last pool session which involved progression of squats. Pt demo'd today increased tightness of L posterior pelvic floor along with tight lumbar mm with hypomobility of L SIJ. Pt tolerated internal and external manual Tx after which pt demo'd decreased tightness and tightenss in these areas. Pt reported feeling sore but had increased mobility. Discussed with pt and together reached an agreement that pt continues to benefit form more manual Tx which will mean she would benefit from two land Tx and to withold on pool sessions until a later time. However, pelvic PT's schedule is booked and can not fit her 2x a week for the next 2 weeks. Pt is scheduled with pool therapy for 2 more visits. DPT communicated with aquatic therapist to not advance her exercises but to maintain gentle exercises.  DPT will keep pt on a cancellation list when land appts open up and will cancel pool sessions if pt gets worked into the schedule. Today's session was provided outside of 2 x week frequency ( today being her 3rd visit for this week) because pt had increased pain after pool session which affected her gait and she almost fell. Pt demo'd improved gait post Tx. Pt  continues to benefit from skilled PT.  Rehab Potential  Good    PT Frequency  2x / week    PT Duration  12 weeks    PT Treatment/Interventions  Functional mobility training;Stair training;Gait training;Patient/family education;Aquatic Therapy;Therapeutic activities;Manual lymph drainage;Moist Heat;Neuromuscular re-education;Therapeutic exercise;Manual techniques;Taping;Energy conservation    Consulted and Agree with Plan of Care  Patient       Patient will benefit from skilled therapeutic intervention in order to improve the following deficits and impairments:  Abnormal gait, Pain, Increased fascial restricitons, Decreased strength, Decreased scar mobility, Increased muscle spasms, Difficulty walking, Decreased mobility, Decreased range of motion, Decreased balance, Decreased safety awareness, Decreased coordination, Postural dysfunction, Improper body mechanics, Decreased endurance  Visit Diagnosis: Myalgia  Pain in right ankle and joints of right foot  Chronic bilateral low back pain with left-sided sciatica  Pain in left ankle and joints of left foot     Problem List There are no active problems to display for this patient.   [name] ,PT, DPT, E-RYT  01/24/2018, 10:53 AM  [place] MAIN [facility] SERVICES [address] [place], [place], [address] Phone: [phone]   Fax:  [phone]  Name: Carolyn Gregory MRN: [id number] Date of Birth: 02-Feb-1961

## 2018-01-27 ENCOUNTER — Ambulatory Visit: Payer: No Typology Code available for payment source | Admitting: Physical Therapy

## 2018-01-28 ENCOUNTER — Ambulatory Visit: Payer: No Typology Code available for payment source

## 2018-01-29 ENCOUNTER — Encounter: Payer: Non-veteran care | Admitting: Physical Therapy

## 2018-01-30 ENCOUNTER — Encounter: Payer: Self-pay | Admitting: Physical Therapy

## 2018-02-03 ENCOUNTER — Encounter: Payer: Non-veteran care | Admitting: Physical Therapy

## 2018-02-03 ENCOUNTER — Ambulatory Visit: Payer: No Typology Code available for payment source | Admitting: Physical Therapy

## 2018-02-03 DIAGNOSIS — M5442 Lumbago with sciatica, left side: Secondary | ICD-10-CM

## 2018-02-03 DIAGNOSIS — M791 Myalgia, unspecified site: Secondary | ICD-10-CM

## 2018-02-03 DIAGNOSIS — R102 Pelvic and perineal pain: Secondary | ICD-10-CM | POA: Diagnosis not present

## 2018-02-03 DIAGNOSIS — M25572 Pain in left ankle and joints of left foot: Secondary | ICD-10-CM

## 2018-02-03 DIAGNOSIS — G8929 Other chronic pain: Secondary | ICD-10-CM

## 2018-02-03 DIAGNOSIS — M25571 Pain in right ankle and joints of right foot: Secondary | ICD-10-CM

## 2018-02-03 NOTE — Patient Instructions (Addendum)
Modified feet massage:  Demonstrated with use of hands to massage plantar fascia, transverse arch, and between rays of feet   Once a day

## 2018-02-04 ENCOUNTER — Ambulatory Visit: Payer: No Typology Code available for payment source

## 2018-02-04 NOTE — Addendum Note (Signed)
Addended by: Mariane MastersYEUNG, SHIN-YIING on: 02/04/2018 12:43 PM   Modules accepted: Orders

## 2018-02-04 NOTE — Therapy (Addendum)
[place] MAIN [facility] SERVICES [address] [place], [place], [address] Phone: [phone]   Fax:  [phone]  Physical Therapy Treatment  Patient Details  Name: Carolyn Gregory MRN: [id number] Date of Birth: 10-27-1960 Referring Provider: Timmie Foerster    Encounter Date: 02/03/2018  PT End of Session - 02/03/18 1409    Visit Number  10    Number of Visits  15    Date for PT Re-Evaluation  02/24/18    Authorization Type  new authorization ends 9/30     PT Start Time  1402    PT Stop Time  1506    PT Time Calculation (min)  64 min    Activity Tolerance  Patient tolerated treatment well;No increased pain    Behavior During Therapy  WFL for tasks assessed/performed       Past Medical History:  Diagnosis Date  . Anxiety   . Depression   . Hypertension     Past Surgical History:  Procedure Laterality Date  . CHOLECYSTECTOMY    . MOHS SURGERY     to remove benign growth  located in the L low abdomen  . R ankle surgery   2016   to repair lateral tendon   . VAGINAL HYSTERECTOMY      There were no vitals filed for this visit.  Subjective Assessment - 02/03/18 1410    Subjective  Pt reported felt pain in her back, knees, feet over the past week. She was in a lot pain. She skipped therappy last week and rested. Pt today reports Continued radiating pain in the back of both thighs, increased pain iat the left side of  lower back , new pain at the right side of lower back, increased pain at bilateral knees. The pain and numbness in the back of thighs occurs even when sitting. Today, pt selected that her feet be the focus for the treatment.  She feels tightness under her toes and thropughout her feet with more pain on L compared to R.  She can not put her fingers between her 1-2nd toes on L/R feet ( worst on L).      Pertinent History  Pt is interested in seeking psychotherapy/ psychiatry for managing a stress in her life regarding a family  member. Pt was tearful at the beginning of session.           [facility] PT Assessment - 02/03/18 1550      Sit to Stand   Comments  slowed with report of pain at her B knees        AROM   Overall AROM Comments  supine: ankle dorsiflexion pre Tx: L 80 deg, R 70 deg,  Post Tx: L 70 deg       Strength   Overall Strength Comments  pre-Tx: pain with L eversion/DF, inversion/ PF with weakness 3+/5, post Tx: 5/5 without pain        Palpation   Palpation comment  tenderness at L navicular, cuboid, heel of L, plantar fascia, adductor hallucis ( both heads), extensor retinaticulum/ between the rays I-V, hypomobility midfoot joints         Ambulation/Gait   Gait Comments  slowed gait, wider BOS 0.83 m/s                    [facility] Adult PT Treatment/Exercise - 02/03/18 1550      Therapeutic Activites    Therapeutic Activities  -- see pt instructions  Manual Therapy   Manual therapy comments  distraction at talocrural joint B, AP/PA mobs grade I-  II and modified with STM using  ligther pressure to pt's tolerance, focusing on increasing mobility at  L navicular, cuboid, heel of L, plantar fascia, adductor hallucis ( both heads), extensor retinaticulum/ between the rays I-V,midfoot joints                PT Education - 02/03/18 1558    Education provided  Yes    Education Details  HEP    Person(s) Educated  Patient    Methods  Explanation;Demonstration;Tactile cues;Verbal cues;Handout    Comprehension  Returned demonstration;Verbalized understanding;Verbal cues required;Tactile cues required          PT Long Term Goals - 01/08/18 1107      PT LONG TERM GOAL #1   Title  Pt will decrease her ODI score from 48% to < 38% in order to return to ADLs ( 10/30: 46%, 11/30: 38%)     Time  12    Period  Weeks    Status  Achieved      PT LONG TERM GOAL #2   Title  Pt will decrease her LEFS score from   25% to > 30%  in order to walk and have less pain at her R ankle.   (11/30: 26%, 4/26: 29%, 6/19: 35%  )     Time  12    Period  Weeks    Status  Achieved      PT LONG TERM GOAL #3   Title  Pt will demo less B ankle instability on uneven ground, improved gait in order to minimize risk for injuries and falls     Time  12    Period  Weeks    Status  Revised      PT LONG TERM GOAL #4   Title  Pt will demo no pelvic obliquities and normal arthrokinematic motion of SIJ in order to minimize radiating pain to walk longer distances    Time  4    Period  Weeks    Status  Achieved      PT LONG TERM GOAL #5   Title  Pt will demo no pelvic floor mm tensions and proper ROM in order to eliminate urine and restore pelvic floor function    Time  12    Period  Weeks    Status  Achieved      PT LONG TERM GOAL #6   Title  Pt will demo decreased scar restrictions over abdomen, IND with abdominal massage, and report more regular bowel movements of 1-2 days in order to restore GI health    Time  12    Period  Weeks    Status  Achieved      PT LONG TERM GOAL #7   Title  Pt will demo no tensions/ tenderness at L sacrococcygeus ligament and coccygeus mm in order to sit for > 30 min and have no pain upon standing    Time  4    Period  Weeks    Status  Achieved      PT LONG TERM GOAL #8   Title  Pt will report no pain 100% of the time with crossing L ankle over R thigh and then getting up from chair  across 1 week in order to don shoes.     Time  4    Period  Weeks    Status  Achieved  PT LONG TERM GOAL  #9   TITLE  Pt will demo no lowered position of bladder and demo Grade 3/3/3/3 in order to improve pelvic girdle and back stability to increase physical activities without less risk for relapse    Time  8    Period  Weeks    Status  Partially Met      PT LONG TERM GOAL  #10   TITLE  Pt will increase plantarflexion reps from 10 reps B to > 20 reps B in order to climb stairs and progress towards balance training on uneven ground     Time  12    Period  Weeks     Status  New      PT LONG TERM GOAL  #11   TITLE  Pt will walk on uneven ground, moderate hills on a local nature trail for 1 mile for minimal pain < 2 /10 and no ankle sprain in order to enjoy her hobby     Time  12    Period  Weeks    Status  New      PT LONG TERM GOAL  #12   TITLE  Pt will be able to fast walking ( levelled ground) for 45 min and compliance to stretching afterwards in order to  minimize risk for prolapse .      Time  8    Period  Weeks    Status  New      PT LONG TERM GOAL  #13   TITLE  Pt will be able to climb stairs with proper technique , step-through pattern with one hand on rail up/ down across one flight in order to navigate community environments     Time  8    Period  Weeks    Status  New      PT LONG TERM GOAL  #14   TITLE  Pt will report no calf pain with stairs up and down or  after walking at a store for 15 min  in order to  progress towards dynamic balance exercises     Time  4    Period  Weeks    Status  New      PT LONG TERM GOAL  #15   TITLE  Pt will demo proper alignment and modifications with yoga to integrate into commuinity yoga classess with less risk for relapse of Sx     Time  12    Period  Weeks    Status  New            Plan - 02/03/18 1559    Clinical Impression Statement  Pt showed slowed gait and sit-to stand t/f with report of back at posterior thighs, knees, feet, and lower back.  Pt wanted her feet to be the area of focus for today's session. Pt was provided modifications in supine position to ensure comfort with a folded blanket placed under her posterior thigh while she received Tx at her feet. Gentle pressure was applied to minimize pain during manual Tx.  Grade I-II mobilizations and gentle STM were used at her feet to improve mobility.  Worked on both feet but primarily on L.  Pt achieved increased L dorsiflexion by 20 deg, increased ankle eversion and inversion strength, and no pain at the low back with MMT DF/ EV and  PF/ INV following Tx. Pt reported her feet pain decreased from ( L 6/10 to 4/10 and R 6/10 to 5/10) and she  felt "looser and increased ROM."  Pt was educated on how to modify self -massage on feet to continue with mobility. Plan to customize her POC to maintain a balance between strengthening and increasing mobility of feet and ankles given pt's prior Hx of ankle sprains and injuries.  Plan to also progress pt to strengthening in a gradual and graded manner given the relapse of pain that occurred when pt progressed to quickly with strengthening in the past visits.   Pt was shown images and explained about feet anatomy and physiology to promote more understanding about the interconnection of feet to pelvic and back. Pt reported she remains hopeful despite the relapse of pain she experienced. Pt continues to benefit from skilled PT.   Rehab Potential  Good    PT Frequency  2x / week    PT Duration 12 weeks    PT Treatment/Interventions  Functional mobility training;Stair training;Gait training;Patient/family education;Aquatic Therapy;Therapeutic activities;Manual lymph drainage;Moist Heat;Neuromuscular re-education;Therapeutic exercise;Manual techniques;Taping;Energy conservation    Consulted and Agree with Plan of Care  Patient       Patient will benefit from skilled therapeutic intervention in order to improve the following deficits and impairments:  Abnormal gait, Pain, Increased fascial restricitons, Decreased strength, Decreased scar mobility, Increased muscle spasms, Difficulty walking, Decreased mobility, Decreased range of motion, Decreased balance, Decreased safety awareness, Decreased coordination, Postural dysfunction, Improper body mechanics, Decreased endurance  Visit Diagnosis: Myalgia  Pain in right ankle and joints of right foot  Chronic bilateral low back pain with left-sided sciatica  Pain in left ankle and joints of left foot     Problem List There are no active problems to  display for this patient.   [name] 02/04/2018, 12:15 PM  [place] MAIN [facility] SERVICES [address] [place], [place], [address] Phone: [phone]   Fax:  [phone]  Name: AARALYNN SHEPHEARD MRN: [id number] Date of Birth: 1960-09-25

## 2018-02-05 ENCOUNTER — Ambulatory Visit: Payer: No Typology Code available for payment source | Admitting: Physical Therapy

## 2018-02-05 DIAGNOSIS — M5442 Lumbago with sciatica, left side: Secondary | ICD-10-CM

## 2018-02-05 DIAGNOSIS — M791 Myalgia, unspecified site: Secondary | ICD-10-CM

## 2018-02-05 DIAGNOSIS — R102 Pelvic and perineal pain: Secondary | ICD-10-CM | POA: Diagnosis not present

## 2018-02-05 DIAGNOSIS — M25572 Pain in left ankle and joints of left foot: Secondary | ICD-10-CM

## 2018-02-05 DIAGNOSIS — G8929 Other chronic pain: Secondary | ICD-10-CM

## 2018-02-05 DIAGNOSIS — M25571 Pain in right ankle and joints of right foot: Secondary | ICD-10-CM

## 2018-02-05 NOTE — Patient Instructions (Signed)
Hold off previous exercises at the feet   Gentle knee to chest while on your side , gradual, increment  Use hip stretches (figure-4)    Heat 20 min over buttocks  Ice over the [place] of L foot/ arch

## 2018-02-05 NOTE — Therapy (Signed)
[place] MAIN [facility] SERVICES [address] [place], [place], [address] Phone: [phone]   Fax:  [phone]  Physical Therapy Treatment  Patient Details  Name: Carolyn Gregory MRN: [id number] Date of Birth: Oct 23, 1960 Referring Provider: Timmie Foerster    Encounter Date: 02/05/2018    Past Medical History:  Diagnosis Date  . Anxiety   . Depression   . Hypertension     Past Surgical History:  Procedure Laterality Date  . CHOLECYSTECTOMY    . MOHS SURGERY     to remove benign growth  located in the L low abdomen  . R ankle surgery   2016   to repair lateral tendon   . VAGINAL HYSTERECTOMY      There were no vitals filed for this visit.  Subjective Assessment - 02/05/18 1312    Subjective  Pt reported felt pain in her back, knees, feet over the past week. She was in a lot pain. She skipped therappy last week and rested. Pt today reports Continued radiating pain in the back of both thighs, increased pain iat the left side of  lower back , new pain at the right side of lower back, increased pain at bilateral knees. The pain and numbness in the back of thighs occurs even when sitting. Today, pt selected that her feet be the focus for the treatment.  She feels tightness under her toes and thropughout her feet with more pain on L compared to R.  She can not put her fingers between her 1-2nd toes on L/R feet ( worst on L).    (Pended)     Pertinent History  Pt is interested in seeking psychotherapy/ psychiatry for managing a stress in her life regarding a family member. Pt was tearful at the beginning of session.    (Pended)                                PT Education - 02/05/18 1823    Education provided  Yes    Education Details  HEP     Person(s) Educated  Patient    Methods  Explanation;Demonstration;Tactile cues;Handout    Comprehension  Returned demonstration;Verbalized understanding          PT Long  Term Goals - 01/08/18 1107      PT LONG TERM GOAL #1   Title  Pt will decrease her ODI score from 48% to < 38% in order to return to ADLs ( 10/30: 46%, 11/30: 38%)     Time  12    Period  Weeks    Status  Achieved      PT LONG TERM GOAL #2   Title  Pt will decrease her LEFS score from   25% to > 30%  in order to walk and have less pain at her R ankle.  (11/30: 26%, 4/26: 29%, 6/19: 35%  )     Time  12    Period  Weeks    Status  Achieved      PT LONG TERM GOAL #3   Title  Pt will demo less B ankle instability on uneven ground, improved gait in order to minimize risk for injuries and falls     Time  12    Period  Weeks    Status  Revised      PT LONG TERM GOAL #4   Title  Pt will demo no  pelvic obliquities and normal arthrokinematic motion of SIJ in order to minimize radiating pain to walk longer distances    Time  4    Period  Weeks    Status  Achieved      PT LONG TERM GOAL #5   Title  Pt will demo no pelvic floor mm tensions and proper ROM in order to eliminate urine and restore pelvic floor function    Time  12    Period  Weeks    Status  Achieved      PT LONG TERM GOAL #6   Title  Pt will demo decreased scar restrictions over abdomen, IND with abdominal massage, and report more regular bowel movements of 1-2 days in order to restore GI health    Time  12    Period  Weeks    Status  Achieved      PT LONG TERM GOAL #7   Title  Pt will demo no tensions/ tenderness at L sacrococcygeus ligament and coccygeus mm in order to sit for > 30 min and have no pain upon standing    Time  4    Period  Weeks    Status  Achieved      PT LONG TERM GOAL #8   Title  Pt will report no pain 100% of the time with crossing L ankle over R thigh and then getting up from chair  across 1 week in order to don shoes.     Time  4    Period  Weeks    Status  Achieved      PT LONG TERM GOAL  #9   TITLE  Pt will demo no lowered position of bladder and demo Grade 3/3/3/3 in order to improve  pelvic girdle and back stability to increase physical activities without less risk for relapse    Time  8    Period  Weeks    Status  Partially Met      PT LONG TERM GOAL  #10   TITLE  Pt will increase plantarflexion reps from 10 reps B to > 20 reps B in order to climb stairs and progress towards balance training on uneven ground     Time  12    Period  Weeks    Status  New      PT LONG TERM GOAL  #11   TITLE  Pt will walk on uneven ground, moderate hills on a local nature trail for 1 mile for minimal pain < 2 /10 and no ankle sprain in order to enjoy her hobby     Time  12    Period  Weeks    Status  New      PT LONG TERM GOAL  #12   TITLE  Pt will be able to fast walking ( levelled ground) for 45 min and compliance to stretching afterwards in order to  minimize risk for prolapse .      Time  8    Period  Weeks    Status  New      PT LONG TERM GOAL  #13   TITLE  Pt will be able to climb stairs with proper technique , step-through pattern with one hand on rail up/ down across one flight in order to navigate community environments     Time  8    Period  Weeks    Status  New      PT LONG TERM GOAL  #14  TITLE  Pt will report no calf pain with stairs up and down or  after walking at a store for 15 min  in order to  progress towards dynamic balance exercises     Time  4    Period  Weeks    Status  New      PT LONG TERM GOAL  #15   TITLE  Pt will demo proper alignment and modifications with yoga to integrate into commuinity yoga classess with less risk for relapse of Sx     Time  12    Period  Weeks    Status  New            Plan - 02/05/18 1816    Clinical Impression Statement  Pt had increased pain following last session. Today's session decreased her pain and different manual techniques were applied to minimize flare-ups. Pt  reported her pain from 9/10 to 5/10 and after heat for 20 min, pt felt the heat made a big difference. After Tx, she reported the low back/ glut  pain was 40% less with getting out of bed to sitting. The pain on her L foot had no change. From today's Tx,  walking is better and not as painful even with short steps.  Her knees still felt weak but not as painful when with sit to stand but the L knee did buckle. Pt's pain upon palpation along L ITBand decreased from 9/10 to 7/10  and on R from 5/10 to 4/10, along L sacral border 9/10 to 7/10 and R 9/10 to 4/10.  Noted decreased mm and fascial tensions along B glut, ITBand, and lumbar mm. Pt tolerated more to gentle fascial releases and small rotational mob at lumbar spine with small truck rotation compared to deeper pressure soft tissue techniques which was applied at last session after which pt reported increased pain in multiple areas. Pt also required application of heat following manual Tx to minimize flare-ups. Plan to apply these techniques and interventions at upcoming sessions to address knee and foot issues. Pt will also benefit from slower upgrade with strengthening lower kinetic chain and more flexibility HEP to compliment strengthening exercises. Pt continues to benefit from skilled PT.       Rehab Potential  Good    PT Frequency  2x / week    PT Duration  12 weeks    PT Treatment/Interventions  Functional mobility training;Stair training;Gait training;Patient/family education;Aquatic Therapy;Therapeutic activities;Manual lymph drainage;Moist Heat;Neuromuscular re-education;Therapeutic exercise;Manual techniques;Taping;Energy conservation    Consulted and Agree with Plan of Care  Patient       Patient will benefit from skilled therapeutic intervention in order to improve the following deficits and impairments:  Abnormal gait, Pain, Increased fascial restricitons, Decreased strength, Decreased scar mobility, Increased muscle spasms, Difficulty walking, Decreased mobility, Decreased range of motion, Decreased balance, Decreased safety awareness, Decreased coordination, Postural dysfunction,  Improper body mechanics, Decreased endurance  Visit Diagnosis: Myalgia     Problem List There are no active problems to display for this patient.   [name] 02/05/2018, 6:41 PM  [place] MAIN [facility] SERVICES [address] [place], [place], [address] Phone: [phone]   Fax:  [phone]  Name: Carolyn Gregory MRN: [id number] Date of Birth: 1961-06-08

## 2018-02-10 ENCOUNTER — Encounter: Payer: Non-veteran care | Admitting: Physical Therapy

## 2018-02-10 ENCOUNTER — Ambulatory Visit: Payer: No Typology Code available for payment source | Attending: Internal Medicine | Admitting: Physical Therapy

## 2018-02-10 DIAGNOSIS — M25572 Pain in left ankle and joints of left foot: Secondary | ICD-10-CM | POA: Insufficient documentation

## 2018-02-10 DIAGNOSIS — M5442 Lumbago with sciatica, left side: Secondary | ICD-10-CM | POA: Insufficient documentation

## 2018-02-10 DIAGNOSIS — M25571 Pain in right ankle and joints of right foot: Secondary | ICD-10-CM | POA: Diagnosis present

## 2018-02-10 DIAGNOSIS — G8929 Other chronic pain: Secondary | ICD-10-CM | POA: Diagnosis present

## 2018-02-10 DIAGNOSIS — M791 Myalgia, unspecified site: Secondary | ICD-10-CM | POA: Diagnosis not present

## 2018-02-11 NOTE — Patient Instructions (Signed)
Modified open book to increased thoracic mobility   Body scan 1-3 x daily ( emailed audio with research and pain science booklet)

## 2018-02-11 NOTE — Therapy (Signed)
[place] MAIN [facility] SERVICES [address] [place], [place], [address] Phone: [phone]   Fax:  [phone]  Physical Therapy Treatment  Patient Details  Name: Carolyn Gregory MRN: [id number] Date of Birth: June 22, 1961 Referring Provider: Timmie Foerster    Encounter Date: 02/10/2018  PT End of Session - 02/11/18 1551    Visit Number  11    Number of Visits  15    Authorization Type  new authorization ends 9/30     PT Start Time  1600    PT Stop Time  0177    PT Time Calculation (min)  85 min    Activity Tolerance  Patient tolerated treatment well;No increased pain    Behavior During Therapy  WFL for tasks assessed/performed       Past Medical History:  Diagnosis Date  . Anxiety   . Depression   . Hypertension     Past Surgical History:  Procedure Laterality Date  . CHOLECYSTECTOMY    . MOHS SURGERY     to remove benign growth  located in the L low abdomen  . R ankle surgery   2016   to repair lateral tendon   . VAGINAL HYSTERECTOMY      There were no vitals filed for this visit.  Subjective Assessment - 02/10/18 1603    Subjective  Pt reports no change with her pain.  Pt reports she would like to work on her pelvic floor today. Pt feels R knee pain with difficulty straightening it out. Pt feels frustrated about her pain.     Pertinent History  Pt is interested in seeking psychotherapy/ psychiatry for managing a stress in her life regarding a family member. Pt was tearful at the beginning of session.           [facility] PT Assessment - 02/11/18 1538      AROM   Overall AROM Comments  ankle DF on L with good carry over from last session. L hip flexion limited to 90 deg due to LBP      Strength   Overall Strength Comments  pain with MMT at ankle today ( poor carry over )       Palpation   Patella mobility  Slight R medial swelling tibial plateau.  Post Tx with increased knee extension     Spinal mobility  signficant  tightness at thoracic spine, upper trap, midtrap, paraspinals , QL B. Pulling Pain with open book L rotation ( pain at L upper trap and trigger point tenderness at scap spine medial aspect).      SI assessment   no carry over with palpation at lateral sacrum B, PSIS, glut attachments.  L lateral thigh palpation with good carry over. Poor carry over at gluts B with tenderness/ tensions noted today     Palpation comment  L adductor hallucis with good carry over, no tightness                  Pelvic Floor Special Questions - 02/11/18 1544    Prolapse  Anterior Wall bladderlowered below pubic symphysis within introitus     Pelvic Floor Internal Exam  pt consented verbally without contraindication     Exam Type  Vaginal    Palpation  B tenderness lateral to bladder wall B, limited cranial movement of pelvic floor B     Strength  weak squeeze, no lift    Strength # of reps  -- withheld contractions due  to reproduction of L LBP         [facility] Adult PT Treatment/Exercise - 02/11/18 1545      Therapeutic Activites    Therapeutic Activities  -- body scan and pain science education      Neuro Re-ed    Neuro Re-ed Details   see pt instructions modified open book       Modalities   Modalities  Moist Heat      Moist Heat Therapy   Number Minutes Moist Heat  10 Minutes    Moist Heat Location  -- neck/ lumbar       Manual Therapy   Manual therapy comments  combined external fascial glide over suprapubic area to faciliate pelvic floor movement, cranial position of bladder.  two folded blankets folded under hips to elevate for more cranial positon of bladder/ pelvic floor coordination training but removed blankets and discontinued when pt reported increased LBP w/ radiation ot midback. Pt was lowered to hooklying position with more comfort reported.    STM/ MWM at thoracic mm, graded movement when reported pain with L open book exercise. Pillows were placed under head and knees for more  comfort and support through body changes     Internal Pelvic Floor  facilitation of lateral bladder wall to promote more cranial movement of pelvic floor     Kinesiotex  -- stability at B knees/ ankles.      Medial glides at medial tibial plateau to decrease mild swelling. Following Tx to this area, pt reported decreased pain and increased ability to extend knee for more comfort in supine with pillow under knees.         PT Education - 02/11/18 1549    Education provided  Yes    Education Details  HEP    Person(s) Educated  Patient    Methods  Explanation;Demonstration;Tactile cues;Verbal cues    Comprehension  Returned demonstration;Verbalized understanding          PT Long Term Goals - 01/08/18 1107      PT LONG TERM GOAL #1   Title  Pt will decrease her ODI score from 48% to < 38% in order to return to ADLs ( 10/30: 46%, 11/30: 38%)     Time  12    Period  Weeks    Status  Achieved      PT LONG TERM GOAL #2   Title  Pt will decrease her LEFS score from   25% to > 30%  in order to walk and have less pain at her R ankle.  (11/30: 26%, 4/26: 29%, 6/19: 35%  )     Time  12    Period  Weeks    Status  Achieved      PT LONG TERM GOAL #3   Title  Pt will demo less B ankle instability on uneven ground, improved gait in order to minimize risk for injuries and falls     Time  12    Period  Weeks    Status  Revised      PT LONG TERM GOAL #4   Title  Pt will demo no pelvic obliquities and normal arthrokinematic motion of SIJ in order to minimize radiating pain to walk longer distances    Time  4    Period  Weeks    Status  Achieved      PT LONG TERM GOAL #5   Title  Pt will demo no pelvic floor mm tensions and  proper ROM in order to eliminate urine and restore pelvic floor function    Time  12    Period  Weeks    Status  Achieved      PT LONG TERM GOAL #6   Title  Pt will demo decreased scar restrictions over abdomen, IND with abdominal massage, and report more regular  bowel movements of 1-2 days in order to restore GI health    Time  12    Period  Weeks    Status  Achieved      PT LONG TERM GOAL #7   Title  Pt will demo no tensions/ tenderness at L sacrococcygeus ligament and coccygeus mm in order to sit for > 30 min and have no pain upon standing    Time  4    Period  Weeks    Status  Achieved      PT LONG TERM GOAL #8   Title  Pt will report no pain 100% of the time with crossing L ankle over R thigh and then getting up from chair  across 1 week in order to don shoes.     Time  4    Period  Weeks    Status  Achieved      PT LONG TERM GOAL  #9   TITLE  Pt will demo no lowered position of bladder and demo Grade 3/3/3/3 in order to improve pelvic girdle and back stability to increase physical activities without less risk for relapse    Time  8    Period  Weeks    Status  Partially Met      PT LONG TERM GOAL  #10   TITLE  Pt will increase plantarflexion reps from 10 reps B to > 20 reps B in order to climb stairs and progress towards balance training on uneven ground     Time  12    Period  Weeks    Status  New      PT LONG TERM GOAL  #11   TITLE  Pt will walk on uneven ground, moderate hills on a local nature trail for 1 mile for minimal pain < 2 /10 and no ankle sprain in order to enjoy her hobby     Time  12    Period  Weeks    Status  New      PT LONG TERM GOAL  #12   TITLE  Pt will be able to fast walking ( levelled ground) for 45 min and compliance to stretching afterwards in order to  minimize risk for prolapse .      Time  8    Period  Weeks    Status  New      PT LONG TERM GOAL  #13   TITLE  Pt will be able to climb stairs with proper technique , step-through pattern with one hand on rail up/ down across one flight in order to navigate community environments     Time  8    Period  Weeks    Status  New      PT LONG TERM GOAL  #14   TITLE  Pt will report no calf pain with stairs up and down or  after walking at a store for 15  min  in order to  progress towards dynamic balance exercises     Time  4    Period  Weeks    Status  New  PT LONG TERM GOAL  #15   TITLE  Pt will demo proper alignment and modifications with yoga to integrate into commuinity yoga classess with less risk for relapse of Sx     Time  12    Period  Weeks    Status  New            Plan - 02/10/18 1826    Clinical Impression Statement   Pt returned to session with report of no change to her pain today. Reassessment showed good carry over with increased dorsiflexion on L ankle with no more adductor hallucis mm tightness/ tenderness. Palpation at SIJ, glut, sacrum, R lateral thigh continued to be associated with pain. L lateral thigh tenderness improved since last session. When pt reported discomfort or pain, DPT provided modifications were made to promote comfort in all changing positions ( different placement of PT's hand to minimize pain at the knee, pillows under neck and knees) and pt was able to proceed with each portion of assessment and Tx.  Mild R medial swelling at medial tibial plateau noted when pt reported she could not extend her R knee. Applied medial glides to this area after which pt reported decreased pain and was able to extend knee into supine position with pillow under knees. Kinesiotaping was applied to address her knees and ankles B to promote stability.  Pt requested that her pelvic floor be the area to focus on today. Pelvic floor assessment today showed: lowered position of bladder with tenderness at B lateral border of bladder wall, minimal pelvic floor movement with lengthening and cranial movement. Cues for quick contractions caused L LBP and therefore were withheld immediately. Two folded blankets folded under hips were placed to elevate for more cranial positon of bladder/ pelvic floor during manual Tx and coordination training but removed blankets when pt reported increased LBP w/ radiation to midback. Pt was lowered  to hooklying position with more comfort reported.  Upon assessment of thoracic spine, significant mm tensions are noted at thoracic region, upper trap, paraspinal mm and limited diaphragmatic excursion which explains the limited pelvic floor ROM. Tx was applied at pelvic floor today per pt's request. Given the association between pelvic floor and L LBP and mid back reports, further manual Tx was applied at thorax to promote diaphragmatic excursion. Pt demo'd a more relaxed demeanor following manual Tx.  L open book was modified to graded exercise before point of pain. Educated the importance of resuming body scan / mindfulness technique with research and audio emailed to pt. Pt voiced understanding of HEP.  Pt received heat at the end of session.    Post Tx:  DPT called pt after the session 6:10pm (27 minute total call)  and pt reported the taping on both knees helped her "tremendously" and providing stability. The R ankle feels better with tape. Pt feels tenderness and pain in the L ankle.  Pt also noticed no pain with sit to stand.  Pt has not noticed any pain nor discomfort in the pelvic floor 2/10, and the LBP from 9/10 to 4-4/31 with certain movements and no pain with sitting.    Pt realized "I will focus on the present rather than the things I am experiencing. That will help me embrace the treatment time better like I used and to not tense up. There is something within that tells me to trust the process and to keep showing up. I think it will help to not put a time frame on  getting better. I need to get to self-care."     Pt continues to benefit from skilled PT.      Rehab Potential  Good    PT Frequency  2x / week    PT Duration  12 weeks    PT Treatment/Interventions  Functional mobility training;Stair training;Gait training;Patient/family education;Aquatic Therapy;Therapeutic activities;Manual lymph drainage;Moist Heat;Neuromuscular re-education;Therapeutic exercise;Manual techniques;Taping;Energy  conservation    Consulted and Agree with Plan of Care  Patient       Patient will benefit from skilled therapeutic intervention in order to improve the following deficits and impairments:  Abnormal gait, Pain, Increased fascial restricitons, Decreased strength, Decreased scar mobility, Increased muscle spasms, Difficulty walking, Decreased mobility, Decreased range of motion, Decreased balance, Decreased safety awareness, Decreased coordination, Postural dysfunction, Improper body mechanics, Decreased endurance  Visit Diagnosis: Myalgia  Pain in right ankle and joints of right foot  Pain in left ankle and joints of left foot  Chronic 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   [name] 02/11/2018, 3:53 PM  [place] MAIN [facility] SERVICES [address] [place], [place], [address] Phone: [phone]   Fax:  [phone]  Name: Carolyn Gregory MRN: [id number] Date of Birth: [birth year]

## 2018-02-12 ENCOUNTER — Ambulatory Visit: Payer: No Typology Code available for payment source | Admitting: Physical Therapy

## 2018-02-12 DIAGNOSIS — M25572 Pain in left ankle and joints of left foot: Secondary | ICD-10-CM

## 2018-02-12 DIAGNOSIS — M25571 Pain in right ankle and joints of right foot: Secondary | ICD-10-CM

## 2018-02-12 DIAGNOSIS — G8929 Other chronic pain: Secondary | ICD-10-CM

## 2018-02-12 DIAGNOSIS — M5442 Lumbago with sciatica, left side: Secondary | ICD-10-CM

## 2018-02-12 DIAGNOSIS — M791 Myalgia, unspecified site: Secondary | ICD-10-CM

## 2018-02-12 NOTE — Patient Instructions (Addendum)
Check off grid for this week. You can put the date and a check mark after completing the exercise.   Morning Ex name Mon Tue Wed [facility]dalene [place]             Fri   Sat Sun            Deep core level 2 ( knee out) 6 min          Hamstring stretch with strap ( bending knee and straighten before pain 10 x             Seated: Pressing thigh out into hands while pushing down with big toe ballmound on floor , toes spread.  20 x          Modified thread the needle: hands at the counter, trunk parallel. Knees slightly bent Lengthen spine, 3 breaths. Then R hand on L thigh 3 breaths          Midday Ex name Mon Tue Wed Thurs             Fri   Sat Sun         Deep core level 2 ( knee out) 6 min            Hamstring stretch with strap ( bending knee and straighten before pain 10 x             Seated: Pressing thigh out into hands while pushing down with big toe ballmound on floor , toes spread.  20 x          Modified thread the needle: hands at the counter, trunk parallel. Knees slightly bent Lengthen spine, 3 breaths. Then R hand on L thigh 3 breaths             Evening Ex name Mon Tue Wed Thurs             Fri   Sat Sun          Deep core level 2 ( knee out) 6 min            Hamstring stretch with strap ( bending knee and straighten before pain 10 x             Seated: Pressing thigh out into hands while pushing down with big toe ballmound on floor , toes spread.  20 x          Modified thread the needle: hands at the counter, trunk parallel. Knees slightly bent Lengthen spine, 3 breaths. Then R hand on L thigh 3 breaths  Switch sides

## 2018-02-13 NOTE — Therapy (Addendum)
[place] MAIN [facility] SERVICES [address] [place], [place], [address] Phone: [phone]   Fax:  [phone]  Physical Therapy Treatment  Patient Details  Name: Carolyn Gregory MRN: [id number] Date of Birth: [birth year] Referring Provider: Timmie Foerster    Encounter Date: 02/12/2018  PT End of Session - 02/13/18 1744    Visit Number  12    Number of Visits  15    Authorization Type  new authorization ends 9/30     PT Start Time  6237    PT Stop Time  1410    PT Time Calculation (min)  65 min    Activity Tolerance  Patient tolerated treatment well;No increased pain    Behavior During Therapy  WFL for tasks assessed/performed       Past Medical History:  Diagnosis Date  . Anxiety   . Depression   . Hypertension     Past Surgical History:  Procedure Laterality Date  . CHOLECYSTECTOMY    . MOHS SURGERY     to remove benign growth  located in the L low abdomen  . R ankle surgery   2016   to repair lateral tendon   . VAGINAL HYSTERECTOMY      There were no vitals filed for this visit.  Subjective Assessment - 02/12/18 1313    Subjective  Pt reports a 7/10 pain since last session in all areas of pain ( LBP, knees, ankle, and pelvic floor).  Pt did not continue to perform her open book because there was shapr shooting pain at L LBP/ SIJ  when turning slight L and R .  When sitting here today, there is throbbing pain at the same area 4-5/10.    Pt noticed radiating pain down below buttocks to above the knees on BLE when sitting with knees extended, back support in bed,  walking, when driving with R knee straight, foot onthe pedal  but not when sitting in a chair with the feet on the floor , knees bent. Pt stated she is planning a trip to see her grandchildren out of state and that her son had passed.       Pertinent History  Pt is interested in seeking psychotherapy/ psychiatry for managing a stress in her life regarding a family member.  Pt was tearful at the beginning of session.           [facility] PT Assessment - 02/13/18 1741      Palpation   Palpation comment  increased tightness/ tenderness at semimembranosus L  at thigh and attachment at posterior tilbial plateau ,  adductor hallucis-transverse head.       Ambulation/Gait   Gait Comments  pre Tx: pain with push off on L,  post Tx: less pain ,                    [facility] Adult PT Treatment/Exercise - 02/13/18 1744      Manual Therapy   Manual therapy comments  STM at problem areas noted in assessment, medial glide at tibia, superior and lateral STM at medial femur     Kinesiotex  --   stability at L knee/ ankle.             PT Education - 02/13/18 1744    Education provided  Yes    Education Details  HEP    Person(s) Educated  Patient    Methods  Demonstration;Explanation;Tactile cues;Verbal cues;Handout    Comprehension  Returned demonstration;Verbalized understanding          PT Long Term Goals - 01/08/18 1107      PT LONG TERM GOAL #1   Title  Pt will decrease her ODI score from 48% to < 38% in order to return to ADLs ( 10/30: 46%, 11/30: 38%)     Time  12    Period  Weeks    Status  Achieved      PT LONG TERM GOAL #2   Title  Pt will decrease her LEFS score from   25% to > 30%  in order to walk and have less pain at her R ankle.  (11/30: 26%, 4/26: 29%, 6/19: 35%  )     Time  12    Period  Weeks    Status  Achieved      PT LONG TERM GOAL #3   Title  Pt will demo less B ankle instability on uneven ground, improved gait in order to minimize risk for injuries and falls     Time  12    Period  Weeks    Status  Revised      PT LONG TERM GOAL #4   Title  Pt will demo no pelvic obliquities and normal arthrokinematic motion of SIJ in order to minimize radiating pain to walk longer distances    Time  4    Period  Weeks    Status  Achieved      PT LONG TERM GOAL #5   Title  Pt will demo no pelvic floor mm tensions and proper  ROM in order to eliminate urine and restore pelvic floor function    Time  12    Period  Weeks    Status  Achieved      PT LONG TERM GOAL #6   Title  Pt will demo decreased scar restrictions over abdomen, IND with abdominal massage, and report more regular bowel movements of 1-2 days in order to restore GI health    Time  12    Period  Weeks    Status  Achieved      PT LONG TERM GOAL #7   Title  Pt will demo no tensions/ tenderness at L sacrococcygeus ligament and coccygeus mm in order to sit for > 30 min and have no pain upon standing    Time  4    Period  Weeks    Status  Achieved      PT LONG TERM GOAL #8   Title  Pt will report no pain 100% of the time with crossing L ankle over R thigh and then getting up from chair  across 1 week in order to don shoes.     Time  4    Period  Weeks    Status  Achieved      PT LONG TERM GOAL  #9   TITLE  Pt will demo no lowered position of bladder and demo Grade 3/3/3/3 in order to improve pelvic girdle and back stability to increase physical activities without less risk for relapse    Time  8    Period  Weeks    Status  Partially Met      PT LONG TERM GOAL  #10   TITLE  Pt will increase plantarflexion reps from 10 reps B to > 20 reps B in order to climb stairs and progress towards balance training on uneven ground     Time  12  Period  Weeks    Status  New      PT LONG TERM GOAL  #11   TITLE  Pt will walk on uneven ground, moderate hills on a local nature trail for 1 mile for minimal pain < 2 /10 and no ankle sprain in order to enjoy her hobby     Time  12    Period  Weeks    Status  New      PT LONG TERM GOAL  #12   TITLE  Pt will be able to fast walking ( levelled ground) for 45 min and compliance to stretching afterwards in order to  minimize risk for prolapse .      Time  8    Period  Weeks    Status  New      PT LONG TERM GOAL  #13   TITLE  Pt will be able to climb stairs with proper technique , step-through pattern with  one hand on rail up/ down across one flight in order to navigate community environments     Time  8    Period  Weeks    Status  New      PT LONG TERM GOAL  #14   TITLE  Pt will report no calf pain with stairs up and down or  after walking at a store for 15 min  in order to  progress towards dynamic balance exercises     Time  4    Period  Weeks    Status  New      PT LONG TERM GOAL  #15   TITLE  Pt will demo proper alignment and modifications with yoga to integrate into commuinity yoga classess with less risk for relapse of Sx     Time  12    Period  Weeks    Status  New            Plan - 02/12/18 1409    Clinical Impression Statement  Pt responded to last session with positive change as pt's pain level remained at a level of 7/10 which was the same level at the end of last session 2 days ago ( decreased from 9/10) .  B ankle dorsiflexion ROM are being maintained from previous sessions.   Focused today on knees per pt's request. Applied manual Tx along tight semitendominosus mm L which pt tolerated without increased pain. Following Tx, sharp pain in the L LBP and SIJ decreased to a dull pain when crossing leg to put on shoes/ socks. Pain decreased from 7/10 to 5/10 at the end of today's session. Pt continued to show antalgic sit-to-stand. Pt's gait improved with less pain in push off during gait.   DPT phoned pt after the today's session 6:12pm  and pt reported she went to [employer] 2-3 hours after today's session and shopped for 20 min w/ cart and she noticed short lasting shooting pain through pelvic floor and the L calf had throbbing pain when she arrived home. DPT advised pt to place heat for 20 min over her calf and lumbar/ pelvic area and explained that the fact that it was short -lasting is a positive sign of healing.   Plan to focus on R knee/ ankle next session. Suspect pt's thoracic/ SIJ  issues is related to lower kinetic chain deficits as displayed in her slowed gait and short  strides.  Discussed with pt the importance of strengthening with adequate flexibility regimen. Replaced open book with  modified thread the needle in standing position with hands at counter which did not cause any radiating pain because pt required more lengthening of lumbar spine before rotation at thoracic spine.   Anticipate her sit-to-stand will improve after B knees and ankles improve with strength/ stability/propioception training.  Pt continues to benefit from skilled PT.   Rehab Potential  Good    PT Frequency  2x / week    PT Duration  12 weeks    PT Treatment/Interventions  Functional mobility training;Stair training;Gait training;Patient/family education;Aquatic Therapy;Therapeutic activities;Manual lymph drainage;Moist Heat;Neuromuscular re-education;Therapeutic exercise;Manual techniques;Taping;Energy conservation    Consulted and Agree with Plan of Care  Patient       Patient will benefit from skilled therapeutic intervention in order to improve the following deficits and impairments:  Abnormal gait, Pain, Increased fascial restricitons, Decreased strength, Decreased scar mobility, Increased muscle spasms, Difficulty walking, Decreased mobility, Decreased range of motion, Decreased balance, Decreased safety awareness, Decreased coordination, Postural dysfunction, Improper body mechanics, Decreased endurance  Visit Diagnosis: Myalgia  Pain in right ankle and joints of right foot  Pain in left ankle and joints of left foot  Chronic 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   [name] ,PT, DPT, E-RYT  02/13/2018, 5:48 PM  [place] MAIN [facility] SERVICES [address] [place], [place], [address] Phone: [phone]   Fax:  [phone]  Name: Carolyn Gregory MRN: [id number] Date of Birth: [birth year]

## 2018-02-17 ENCOUNTER — Ambulatory Visit: Payer: No Typology Code available for payment source | Admitting: Physical Therapy

## 2018-02-19 ENCOUNTER — Ambulatory Visit: Payer: No Typology Code available for payment source | Admitting: Physical Therapy

## 2018-02-19 ENCOUNTER — Encounter: Payer: Self-pay | Admitting: Physical Therapy

## 2018-02-24 ENCOUNTER — Ambulatory Visit: Payer: No Typology Code available for payment source | Admitting: Physical Therapy

## 2018-02-26 ENCOUNTER — Ambulatory Visit: Payer: No Typology Code available for payment source | Admitting: Physical Therapy

## 2018-03-03 ENCOUNTER — Ambulatory Visit: Payer: No Typology Code available for payment source | Admitting: Physical Therapy

## 2018-03-05 ENCOUNTER — Ambulatory Visit: Payer: No Typology Code available for payment source | Admitting: Physical Therapy

## 2018-03-12 ENCOUNTER — Ambulatory Visit: Payer: No Typology Code available for payment source | Admitting: Physical Therapy

## 2018-03-26 ENCOUNTER — Encounter: Payer: Self-pay | Admitting: Physical Therapy

## 2018-03-26 ENCOUNTER — Encounter: Payer: Non-veteran care | Admitting: Physical Therapy

## 2018-03-26 ENCOUNTER — Telehealth: Payer: Self-pay | Admitting: Physical Therapy

## 2018-03-26 NOTE — Telephone Encounter (Signed)
DPT followed up with a pt's email from today 03/26/18  which indicated a request to return her call. DPT spoke with pt for 35 min and discussed the following: 1) pt requested to get more appts for PT. Emailed pt and front desk staff to schedule her for these openings in DPT's schedule: Sept 24 Tuesday@ 11am   Oct 10 Thurs @12noon  Oct 17 Thurs @8am     2) pt explained about family stressors. DPT provided active listening and inquired what support pt would need. Pt voiced she has no suicidal ideations. DPT utilized motivational interviewing. DPT asked if she would like to receive therapy support and pt voiced agreement that it would be helpful. Pt requested a psychiatrist over a psychotherapy because she has been prescribed medications for anxiety in the past and would like to get the opinion from a new provider about the best medications for her at this time.  Pt preferred a female provider. DPT offered her the name of Dr. Maryruth BunKapur who is within the [facility] campus in [place] and informed pt that DPT will forward a note to Dr. Maryruth BunKapur to inquire whether she is accepting new patients and if her office accepts [employer] insurance. DPT informed pt to also give her office tomorrow to inquire. Pt voiced understanding.   DPT emailed the following information to pt:  Dear Victorino DikeJennifer,  Below is the information of Dr. Shelda AltesKapur's office. I will send her a note to see if she is accepting new patients.  I hope you can have a chance to call them as well about whether they accept VA insurance.   Caryn SectionAarti Kapur, MD, PA  [address]  [facility] [address] [place], [place] [address] [phone] ph [phone] fax   [email or web address]  I will continue to help you find a psychiatrist that you feel comfortable with and can take [employer] insurance. Thank you for sharing with me the ways I can support you along your health and wellness journey.    Sincerely,  Mariane MastersShin-Yiing Idaliz Tinkle, PT, DPT,  E-RYT Pelvic Health Physical Therapist [place] [facility] Office: [phone] Fax: [phone] [email or web address]

## 2018-03-26 NOTE — Therapy (Signed)
[facility] [address] [place], [place], [address] Phone: [phone]   Fax:  [phone]  Patient Details  Name: Carolyn BosworthJennifer D Gregory MRN: [id number] Date of Birth: 10-18-1960 Referring Provider:  Encounter Date: 03/26/2018    DPT send a message via Epic to Dr. Caryn SectionAarti Kapur 03/26/18  17:54pm    Dear Dr. Maryruth BunKapur,  I would like to see if you are currently accepting new patients and if your clinic will accept VA insurance? This patient would benefit from therapy as she is going through family stress.  If you can accept her as a new patient, can your office contact her via her mobile # to schedule an appt please?  If you can not accept at this time, do you know of any other female psychiatrists in this area you would recommend?   I have worked with her and has made progress with physical therapy. I will continue to work with her to provide self-care practices for relaxation, strengthening, flexibility, and gradually to a fitness routine ( faster walking-jogging) .   Thank you so much!  Sincerely,  Carolyn MastersShin-Gregory Marcia Gregory, PT, DPT, E-RYT Pelvic Health Physical Therapist [email or web address] [phone]           Carolyn Gregory,Carolyn Gregory 03/26/2018, 5:53 PM   [facility] [address] [place], [place], [address] Phone: [phone]   Fax:  [phone]

## 2018-04-01 ENCOUNTER — Ambulatory Visit: Payer: No Typology Code available for payment source | Attending: Internal Medicine | Admitting: Physical Therapy

## 2018-04-01 DIAGNOSIS — M791 Myalgia, unspecified site: Secondary | ICD-10-CM | POA: Insufficient documentation

## 2018-04-01 DIAGNOSIS — G8929 Other chronic pain: Secondary | ICD-10-CM | POA: Insufficient documentation

## 2018-04-01 DIAGNOSIS — M25571 Pain in right ankle and joints of right foot: Secondary | ICD-10-CM | POA: Diagnosis present

## 2018-04-01 DIAGNOSIS — M5442 Lumbago with sciatica, left side: Secondary | ICD-10-CM | POA: Insufficient documentation

## 2018-04-01 DIAGNOSIS — M25572 Pain in left ankle and joints of left foot: Secondary | ICD-10-CM | POA: Insufficient documentation

## 2018-04-01 NOTE — Patient Instructions (Addendum)
    Prone Heel Press for strengthening sacro-iliac joints  1. Lie on your belly. If you have an arch in your low back or it feels umcomfortable, place a pillow under your low belly/hips to make sure your low back feel comfortable.   2. Place our forehead on top of your palms.      Widen your knees apart for starting position.   3. Inhale, feel belly and low back expand  4. Exhale, feel belly hug in, press heel together and count aloud for 5 sec. Then relax the heel squeezing.  Perform 10 reps of 5 sec holds. 30 reps x 2day.    If you feel entire buttock tighten too much or feel low back pain, apply 50% less effort. As you press your heel together, you will feel as if your pubic bone (front of your pelvis) and sacrum (back of your pelvis) gentle move towards each other or your low abdominal muscles hug in more.     _________  Standing posture  , avoid shifting hips to one side with more weight on one leg than other  STAND with Maintain equal weight on on both legs (ski trance stance) or feet under hips, soft knees, more weight across ballmound and not heels Shift navel forward slightly, shift weight across heel and ball mound, unlock knees  Neutral spine and pelvic alignment (like zipping a zipper)       __________   Proper body mechanics with getting out of a chair to decrease strain  on back &pelvic floor   Avoid holding your breath when Getting out of the chair:  Scoot to front part of chair chair Heels behind feet, feet are hip width apart, nose over toes  Inhale like you are smelling roses Exhale to stand    ___   Instead of quad stretch on your belly that you tried on your own:  Sidelying, bottom knee bent, bottom side body is stabilized,  Using towel to grab ankle but bring ankle back before the low back archs and keep the lowback ad pelvis stable.  5 breaths     _____

## 2018-04-02 ENCOUNTER — Encounter: Payer: Non-veteran care | Admitting: Physical Therapy

## 2018-04-03 ENCOUNTER — Encounter: Payer: Self-pay | Admitting: Physical Therapy

## 2018-04-03 NOTE — Therapy (Signed)
[place] MAIN [facility] SERVICES [address] [place], [place], [address] Phone: [phone]   Fax:  [phone]  Physical Therapy Treatment  Patient Details  Name: Carolyn Gregory MRN: [id number] Date of Birth: Mar 02, 1961 Referring Provider (PT): Timmie Foerster    Encounter Date: 04/01/2018    Past Medical History:  Diagnosis Date  . Anxiety   . Depression   . Hypertension     Past Surgical History:  Procedure Laterality Date  . CHOLECYSTECTOMY    . MOHS SURGERY     to remove benign growth  located in the L low abdomen  . R ankle surgery   2016   to repair lateral tendon   . VAGINAL HYSTERECTOMY      There were no vitals filed for this visit.  Subjective Assessment - 04/03/18 1228    Subjective  Pt reports she is working on removing stressors that she can control and would like to seek psychiatry. Pt has not heard back from Dr. Waylan Boga office yet.  Yesterday, pt felt plantar fasciitis R  like pain for a few hours.  When she uses the toe spacers, she feels discomfort with  sensations at the bottom of her foot from ballmounds towrads the heel .  Avoiding squats and eagle pose has reduced her knee pain. Since last session, pain in her knees and lower back have decreased.   Pain levels today: R foot 7/10, L foot 5/10, B knees 7/10, low back pain and posterior R thigh pain 5/10.   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  Pt tried on her own a stretch where she laid on her stomach to stretch the quads which caused cramping in her R calf and back of of posterior thigh. Pt notices tightness on her LLE restricts her stride.     Pt consulted Dr. Lenora Boys who works in the Pain clinic at the [place]. She is getting pt to see her original  ortho surgeon  who did the  surgeries on her R ankle in order to get her ankle and feet issues addressed, particularly, the sharp shooting pain that has increased since pt has seen current PT.   Her appt is on 05/05/18.          Pertinent History  Pt is interested in seeking psychotherapy/ psychiatry for managing a stress in her life regarding a family member. Pt was tearful at the beginning of session.           [facility] PT Assessment - 04/03/18 1228      Observation/Other Assessments   Observations  90 cm LE length B       AROM   Overall AROM Comments  hip flex in supine: B 70 deg from trunk, hip ext B        Strength   Overall Strength Comments  seated: hip/knee flexion/ 4+/5 B, knee ext 4+/5 B, OKC: ankle PF and DF 5/5,  DF/EV 3+/5 B       Palpation   SI assessment   L iliac crest higher, L sidebend less than R, R malleoli lower than L      Palpation comment R SIJ hypomobility with tenderness.  No tenderness at gluts, lateral thigh R .    Abdominal scar restricted L> R.  Reproduction of radiating pain to lateral side of R ankle/foot with palpation at piriformis R        Bed Mobility   Bed Mobility  --  sidelying, supine, rolling, prone  with no wincing pain     Ambulation/Gait   Gait velocity  pre Tx: 0.9 m/s and post Tx: 1.2 m/s      Gait Comments  L pelvic shift, pres Tx,  no L pelvic shift post Tx                    [facility] Adult PT Treatment/Exercise - 04/03/18 1228      Neuro Re-ed    Neuro Re-ed Details   see pt instructions   modified open book      Manual Therapy   Manual therapy comments  R long axis distraction, APmob in FADDER position and hip flex 90 position. Modified hand position to decrease discomfort at R knee.              PT Education - 04/03/18 1229    Education provided  Yes    Education Details  HEP    Person(s) Educated  Patient    Methods  Explanation;Demonstration;Verbal cues;Tactile cues;Handout    Comprehension  Returned demonstration;Verbalized understanding;Verbal cues required;Tactile cues required          PT Long Term Goals - 01/08/18 1107      PT LONG TERM GOAL #1   Title  Pt will decrease her ODI  score from 48% to < 38% in order to return to ADLs ( 10/30: 46%, 11/30: 38%)     Time  12    Period  Weeks    Status  Achieved      PT LONG TERM GOAL #2   Title  Pt will decrease her LEFS score from   25% to > 30%  in order to walk and have less pain at her R ankle.  (11/30: 26%, 4/26: 29%, 6/19: 35%  )     Time  12    Period  Weeks    Status  Achieved      PT LONG TERM GOAL #3   Title  Pt will demo less B ankle instability on uneven ground, improved gait in order to minimize risk for injuries and falls     Time  12    Period  Weeks    Status  Revised      PT LONG TERM GOAL #4   Title  Pt will demo no pelvic obliquities and normal arthrokinematic motion of SIJ in order to minimize radiating pain to walk longer distances    Time  4    Period  Weeks    Status  Achieved      PT LONG TERM GOAL #5   Title  Pt will demo no pelvic floor mm tensions and proper ROM in order to eliminate urine and restore pelvic floor function    Time  12    Period  Weeks    Status  Achieved      PT LONG TERM GOAL #6   Title  Pt will demo decreased scar restrictions over abdomen, IND with abdominal massage, and report more regular bowel movements of 1-2 days in order to restore GI health    Time  12    Period  Weeks    Status  Achieved      PT LONG TERM GOAL #7   Title  Pt will demo no tensions/ tenderness at L sacrococcygeus ligament and coccygeus mm in order to sit for > 30 min and have no pain upon standing    Time  4  Period  Weeks    Status  Achieved      PT LONG TERM GOAL #8   Title  Pt will report no pain 100% of the time with crossing L ankle over R thigh and then getting up from chair  across 1 week in order to don shoes.     Time  4    Period  Weeks    Status  Achieved      PT LONG TERM GOAL  #9   TITLE  Pt will demo no lowered position of bladder and demo Grade 3/3/3/3 in order to improve pelvic girdle and back stability to increase physical activities without less risk for relapse     Time  8    Period  Weeks    Status  Partially Met      PT LONG TERM GOAL  #10   TITLE  Pt will increase plantarflexion reps from 10 reps B to > 20 reps B in order to climb stairs and progress towards balance training on uneven ground     Time  12    Period  Weeks    Status  New      PT LONG TERM GOAL  #11   TITLE  Pt will walk on uneven ground, moderate hills on a local nature trail for 1 mile for minimal pain < 2 /10 and no ankle sprain in order to enjoy her hobby     Time  12    Period  Weeks    Status  New      PT LONG TERM GOAL  #12   TITLE  Pt will be able to fast walking ( levelled ground) for 45 min and compliance to stretching afterwards in order to  minimize risk for prolapse .      Time  8    Period  Weeks    Status  New      PT LONG TERM GOAL  #13   TITLE  Pt will be able to climb stairs with proper technique , step-through pattern with one hand on rail up/ down across one flight in order to navigate community environments     Time  8    Period  Weeks    Status  New      PT LONG TERM GOAL  #14   TITLE  Pt will report no calf pain with stairs up and down or  after walking at a store for 15 min  in order to  progress towards dynamic balance exercises     Time  4    Period  Weeks    Status  New      PT LONG TERM GOAL  #15   TITLE  Pt will demo proper alignment and modifications with yoga to integrate into commuinity yoga classess with less risk for relapse of Sx     Time  12    Period  Weeks    Status  New            Plan - 04/03/18 1233    Clinical Impression Statement Pt returns to PT after cancelling appts across 2 months. Pt reported she had been facing a lot of stress which she would like to seek help with a psychiatrist. DPT had sent a message to Dr. Waylan Boga office last week but pt has not heard from their office. DPT plans to call them to f/u with this referral.   Improvements: Pt h as achieved 7/12 goals  and is progressing well towards her  remaining goals. Two months ago on 02/03/18, pt had experienced a relapse in her LBP, pelvic, knee, and ankle pain after performing squats in the pool and a yoga pose on land.  The sessions following this relapse addressed tight mm over these areas and limited hip and SIJ ROM.   Upon reassessment today, pt showed increased strength bilaterally at hips and knees. Her hip flexion ROM in supine also increased as she demo'd no increased mm tightness nor tenderness upon palpation.  Pt demo'd no wincing pain nor slowed movement with bed mobility ( sidelying, rolling) compared to prior visits since her relapse.   Today's Tx focused on pt's report of limitations with her LLE when walking. Assessed pt's gait which showed L lateral shift and R sided SIJ hypomobility. Pt tolerated manual Tx.  DPT adjusted hand position to minimize sensitivity at knees during manual Tx. Gentle mobilization to SIJ helped to facilitate increased arthrokinematics mobility. Post Tx, pt gait speed increased from 0.9 m/s to 1.2 m/s with less L lateral pelvic shift. Pt reported she feels her L side is less restricted with her stride. She stated she is able to walk with equal stride which is something she has not been to do in a long time.   Post Tx: low back pain pain decreased from 7/10 to 4/10, knees from 7/10 to 6/10 and no change at the feet ( which was not addressed today).    Remaining issues which DPT plans to address at next session : Tightness in both feet and something restricting her L upper buttock but not excuriating pain.    Updates:  Pt consulted Dr. Lenora Boys who works in the Pain clinic at the [place]. She is getting pt to see her original  ortho surgeon  who did the  surgeries on her R ankle in order to get her ankle and feet issues addressed, particularly, the sharp shooting pain that has increased since pt has seen current PT.  Her appt is on 05/05/18.    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    DPT discussed with pt that DPT would like to know about the POC she will be receiving with her other providers to build proper communication and understanding to her interdisciplinary care.       Rehab Potential  Good    PT Frequency  1x / week    PT Duration  12 weeks    PT Treatment/Interventions  Functional mobility training;Stair training;Gait training;Patient/family education;Aquatic Therapy;Therapeutic activities;Manual lymph drainage;Moist Heat;Neuromuscular re-education;Therapeutic exercise;Manual techniques;Taping;Energy conservation    Consulted and Agree with Plan of Care  Patient       Patient will benefit from skilled therapeutic intervention in order to improve the following deficits and impairments:  Abnormal gait, Pain, Increased fascial restricitons, Decreased strength, Decreased scar mobility, Increased muscle spasms, Difficulty walking, Decreased mobility, Decreased range of motion, Decreased balance, Decreased safety awareness, Decreased coordination, Postural dysfunction, Improper body mechanics, Decreased endurance  Visit Diagnosis: Myalgia  Pain in right ankle and joints of right foot  Pain in left ankle and joints of left foot  Chronic 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   [name] 04/03/2018, 12:33 PM  [place] MAIN [facility] SERVICES [address] [place], [place], [address] Phone: [phone]   Fax:  [phone]  Name:  Carolyn Gregory MRN: [id number] Date of Birth: Dec 04, 1960

## 2018-04-07 ENCOUNTER — Encounter: Payer: Non-veteran care | Admitting: Physical Therapy

## 2018-04-08 ENCOUNTER — Ambulatory Visit: Payer: No Typology Code available for payment source | Attending: Internal Medicine | Admitting: Physical Therapy

## 2018-04-08 DIAGNOSIS — M791 Myalgia, unspecified site: Secondary | ICD-10-CM | POA: Insufficient documentation

## 2018-04-08 DIAGNOSIS — M5442 Lumbago with sciatica, left side: Secondary | ICD-10-CM | POA: Diagnosis present

## 2018-04-08 DIAGNOSIS — M25571 Pain in right ankle and joints of right foot: Secondary | ICD-10-CM | POA: Insufficient documentation

## 2018-04-08 DIAGNOSIS — M25572 Pain in left ankle and joints of left foot: Secondary | ICD-10-CM | POA: Insufficient documentation

## 2018-04-08 DIAGNOSIS — G8929 Other chronic pain: Secondary | ICD-10-CM | POA: Diagnosis present

## 2018-04-09 NOTE — Patient Instructions (Signed)
Feet massage, the "T" motion across the plantar fascia and spreading the rays of the foot to massage the interreous muscles.   Continue with good walking mechanics: Wider BOS, less pronation, longer stride, stronger push off

## 2018-04-09 NOTE — Therapy (Addendum)
[place] MAIN [facility] SERVICES [address] [place], [place], [address] Phone: [phone]   Fax:  [phone]  Physical Therapy Treatment  Patient Details  Name: Carolyn Gregory MRN: [id number] Date of Birth: [birth year] Referring Provider (PT): Timmie Foerster    Encounter Date: 04/08/2018  PT End of Session - 04/09/18 1738    Visit Number  14    Authorization Type  authorization ends 9/30 under [employer],   ( 4/70 visit  exp 11/15 )     PT Start Time  1103    PT Stop Time  1225    PT Time Calculation (min)  82 min    Activity Tolerance  Patient tolerated treatment well;No increased pain    Behavior During Therapy  WFL for tasks assessed/performed       Past Medical History:  Diagnosis Date  . Anxiety   . Depression   . Hypertension     Past Surgical History:  Procedure Laterality Date  . CHOLECYSTECTOMY    . MOHS SURGERY     to remove benign growth  located in the L low abdomen  . R ankle surgery   2016   to repair lateral tendon   . VAGINAL HYSTERECTOMY      There were no vitals filed for this visit.  Subjective Assessment - 04/08/18 1113    Subjective  Pt reports 60% improvement with less restriction in her LLE and stride since last session. Pt is noticing more pain in both knees after last session when the knees were held during the adjustments. There is some back discomfort in the lower back. The radiating pain no logner goes down to B legs to the calf and has only radiated down both back of the thighs in the middle. The area of the radiating pain is smaller.  The radiating pain does not always at the same time and pt is not able to differentiate one more than the other side.  When pt is trying stretch her R ankle and doing range of motion exercises, like with bend ankle back under chair, she notices shooting and throbbing pain. It swells. With touch, the inside and ouside of the ankle feels as if it has not healed.  Her  surgeon has told her that she would need hand on work to prevent scar tissue from forming again.  Pt had not received the hands on treatments despite trying to get care at different locations since after surgery in 2016.  Pt has not received imaging since the surgery and since her reinjury in 2017 when she twisted her R ankle.  She has an upcoming appointment on the 05/05/18.  Pt would like to address her feet today. Pt noticed more pain in her feet that lasted for days after walking fast in the clinic during the walking test.       Pertinent History  Pt is interested in seeking psychotherapy/ psychiatry for managing a stress in her life regarding a family member. Pt was tearful at the beginning of session.           [facility] PT Assessment - 04/09/18 1741      Strength   Overall Strength Comments  ankle DF/EV in supine 5/5 without pain referring to back like it did in several sessions prior       Ambulation/Gait   Gait Comments  signficantly less pronation , lengthened stride , no pelvic shift, stronger push off , wider BOS  [facility] Adult PT Treatment/Exercise - 04/09/18 1741      Ambulation/Gait   Gait velocity  pre Tx: 0.96 m/s and post Tx:  0.99 m/s        Therapeutic Activites    Therapeutic Activities  --   see pt instructions, gait assessment, ankle measurements     Modalities   Modalities  --   moist pack, both feet in relaxed position, DPT guided relaxation     Manual Therapy   Manual therapy comments  STM along interreous, adductor hallucis transverse  head, and fascial releases over extensor retinaculum                   PT Long Term Goals - 01/08/18 1107      PT LONG TERM GOAL #1   Title  Pt will decrease her ODI score from 48% to < 38% in order to return to ADLs ( 10/30: 46%, 11/30: 38%)     Time  12    Period  Weeks    Status  Achieved      PT LONG TERM GOAL #2   Title  Pt will decrease her LEFS score from   25% to > 30%  in  order to walk and have less pain at her R ankle.  (11/30: 26%, 4/26: 29%, 6/19: 35%  )     Time  12    Period  Weeks    Status  Achieved      PT LONG TERM GOAL #3   Title  Pt will demo less B ankle instability on uneven ground, improved gait in order to minimize risk for injuries and falls     Time  12    Period  Weeks    Status  Revised      PT LONG TERM GOAL #4   Title  Pt will demo no pelvic obliquities and normal arthrokinematic motion of SIJ in order to minimize radiating pain to walk longer distances    Time  4    Period  Weeks    Status  Achieved      PT LONG TERM GOAL #5   Title  Pt will demo no pelvic floor mm tensions and proper ROM in order to eliminate urine and restore pelvic floor function    Time  12    Period  Weeks    Status  Achieved      PT LONG TERM GOAL #6   Title  Pt will demo decreased scar restrictions over abdomen, IND with abdominal massage, and report more regular bowel movements of 1-2 days in order to restore GI health    Time  12    Period  Weeks    Status  Achieved      PT LONG TERM GOAL #7   Title  Pt will demo no tensions/ tenderness at L sacrococcygeus ligament and coccygeus mm in order to sit for > 30 min and have no pain upon standing    Time  4    Period  Weeks    Status  Achieved      PT LONG TERM GOAL #8   Title  Pt will report no pain 100% of the time with crossing L ankle over R thigh and then getting up from chair  across 1 week in order to don shoes.     Time  4    Period  Weeks    Status  Achieved      PT LONG  TERM GOAL  #9   TITLE  Pt will demo no lowered position of bladder and demo Grade 3/3/3/3 in order to improve pelvic girdle and back stability to increase physical activities without less risk for relapse    Time  8    Period  Weeks    Status  Partially Met      PT LONG TERM GOAL  #10   TITLE  Pt will increase plantarflexion reps from 10 reps B to > 20 reps B in order to climb stairs and progress towards balance  training on uneven ground     Time  12    Period  Weeks    Status  New      PT LONG TERM GOAL  #11   TITLE  Pt will walk on uneven ground, moderate hills on a local nature trail for 1 mile for minimal pain < 2 /10 and no ankle sprain in order to enjoy her hobby     Time  12    Period  Weeks    Status  New      PT LONG TERM GOAL  #12   TITLE  Pt will be able to fast walking ( levelled ground) for 45 min and compliance to stretching afterwards in order to  minimize risk for prolapse .      Time  8    Period  Weeks    Status  New      PT LONG TERM GOAL  #13   TITLE  Pt will be able to climb stairs with proper technique , step-through pattern with one hand on rail up/ down across one flight in order to navigate community environments     Time  8    Period  Weeks    Status  New      PT LONG TERM GOAL  #14   TITLE  Pt will report no calf pain with stairs up and down or  after walking at a store for 15 min  in order to  progress towards dynamic balance exercises     Time  4    Period  Weeks    Status  New      PT LONG TERM GOAL  #15   TITLE  Pt will demo proper alignment and modifications with yoga to integrate into commuinity yoga classess with less risk for relapse of Sx     Time  12    Period  Weeks    Status  New            Plan - 04/09/18 1739    Clinical Impression Statement  Pt responded well to past session with decreased LBP with walking, longer stride, less pronation. Focused on pt's feet per her request. Pt showed stronger ankle DF/EV in OKC position without referred pain to LBP which is an improvement compared to prior visits. Pt demo'd increased plantarflexion ROM and stronger push off in gait following manual Tx to release instrinsic feet mm on LLE. Plan to address abdominal scar at next session to yield longer lasting benefits to her lower kinetic chain deficits.  Pt was tearful at end of session, expressing her concerns for a family member while also acknowledging  she needs to focus on her own health.  Pt was provided the number to call for Chaplain services which pt voiced she would call. Pt reported she heard back for the psychiatrist office where DPT referred her to but they notified her that her  [employer] [facility] is not accepted there. DPT encouraged pt to f/u with the clinic to see if they can provider other reputable psychiatrists who may take [employer] insurance. Pt voiced understanding. Pt continues to benefit from skilled PT.       Rehab Potential  Good    PT Frequency  1x / week    PT Duration  12 weeks    PT Treatment/Interventions  Functional mobility training;Stair training;Gait training;Patient/family education;Aquatic Therapy;Therapeutic activities;Manual lymph drainage;Moist Heat;Neuromuscular re-education;Therapeutic exercise;Manual techniques;Taping;Energy conservation    Consulted and Agree with Plan of Care  Patient       Patient will benefit from skilled therapeutic intervention in order to improve the following deficits and impairments:  Abnormal gait, Pain, Increased fascial restricitons, Decreased strength, Decreased scar mobility, Increased muscle spasms, Difficulty walking, Decreased mobility, Decreased range of motion, Decreased balance, Decreased safety awareness, Decreased coordination, Postural dysfunction, Improper body mechanics, Decreased endurance  Visit Diagnosis: Myalgia  Pain in right ankle and joints of right foot  Pain in left ankle and joints of left foot  Chronic 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   [name] ,PT, DPT, E-RYT  04/09/2018, 5:45 PM  [place] MAIN [facility] SERVICES [address] [place], [place], [address] Phone: [phone]   Fax:  [phone]  Name: Carolyn Gregory MRN: [id number] Date of Birth: Jan 09, 1961

## 2018-04-17 ENCOUNTER — Ambulatory Visit: Payer: No Typology Code available for payment source | Admitting: Physical Therapy

## 2018-04-17 DIAGNOSIS — M791 Myalgia, unspecified site: Secondary | ICD-10-CM

## 2018-04-17 DIAGNOSIS — G8929 Other chronic pain: Secondary | ICD-10-CM

## 2018-04-17 DIAGNOSIS — M25572 Pain in left ankle and joints of left foot: Secondary | ICD-10-CM

## 2018-04-17 DIAGNOSIS — M25571 Pain in right ankle and joints of right foot: Secondary | ICD-10-CM

## 2018-04-17 DIAGNOSIS — M5442 Lumbago with sciatica, left side: Secondary | ICD-10-CM

## 2018-04-18 NOTE — Patient Instructions (Addendum)
Deep core level 2  \  Abdominal massage upward from L,  R , center to belly button 3 stroke x 3 , pressure is gentle and light with all fingers flat, not using finger tips    Utilize Chaplain services [phone] for stress relief , schedule ahead for the days of your PT appts.

## 2018-04-18 NOTE — Therapy (Signed)
[place] MAIN [facility] SERVICES [address] [place], [place], [address] Phone: [phone]   Fax:  [phone]  Physical Therapy Treatment  Patient Details  Name: Carolyn Gregory MRN: [id number] Date of Birth: Jul 15, 1960 Referring Provider (PT): Timmie Foerster    Encounter Date: 04/17/2018  PT End of Session - 04/18/18 1302    Visit Number  15    Authorization Type  authorization ends 9/30 under [employer],   ( 8/17 visit  exp 11/15 )     PT Start Time  1200    PT Stop Time  1310    PT Time Calculation (min)  70 min    Activity Tolerance  Patient tolerated treatment well;No increased pain    Behavior During Therapy  WFL for tasks assessed/performed       Past Medical History:  Diagnosis Date  . Anxiety   . Depression   . Hypertension     Past Surgical History:  Procedure Laterality Date  . CHOLECYSTECTOMY    . MOHS SURGERY     to remove benign growth  located in the L low abdomen  . R ankle surgery   2016   to repair lateral tendon   . VAGINAL HYSTERECTOMY      There were no vitals filed for this visit.  Subjective Assessment - 04/17/18 1214    Subjective  Pt reported she feels depressed. Pt was tearful when speaking about a family situation .  Pt was contacted by Dr. Waylan Boga office but they do not take [place] insurance. Pt has not looked into other offices that will take [place] insurance because she has been feelign overwhelmed.  Pt would not like to seek psychiatry at the [facility] due to past trraumatic events she had there.  Pt had a near fall after a tripping over a parking lot bar this past Sunday.  Pt had increased back pain since this incident. When she put her L foot down, the LBP occured.  8-9/10. Sitting here 4/10. She feels It felt like she had knocked something out of alignment.  Prior to this incident, pt had no pain in her L LBP while walking and when using proper sitting/ sit to stand techniques.  Pt went to see a physical  therapist at the [facility] for an evaluation her B knees pain  on 04/09/18.         Pertinent History  Pt is interested in seeking psychotherapy/ psychiatry for managing a stress in her life regarding a family member. Pt was tearful at the beginning of session.           [facility] PT Assessment - 04/18/18 1259      Palpation   SI assessment   pelvis aligned, R SIJ slight hypomobile in FADDIR ( improved post Tx)    Palpation comment  Abdominal scar restricted with report of referred pain low back area with  palpation on L end of the scar       Ambulation/Gait   Gait Comments  pelvic shift to L ( post Tx: absent, less deviations, pain 2/10 )                    [facility] Adult PT Treatment/Exercise - 04/18/18 1259      Manual Therapy   Manual therapy comments  abdominal scar release              PT Education - 04/18/18 1302    Education provided  Yes  Education Details  HEP    Person(s) Educated  Patient    Methods  Explanation;Demonstration;Tactile cues;Verbal cues    Comprehension  Returned demonstration;Verbalized understanding          PT Long Term Goals - 01/08/18 1107      PT LONG TERM GOAL #1   Title  Pt will decrease her ODI score from 48% to < 38% in order to return to ADLs ( 10/30: 46%, 11/30: 38%)     Time  12    Period  Weeks    Status  Achieved      PT LONG TERM GOAL #2   Title  Pt will decrease her LEFS score from   25% to > 30%  in order to walk and have less pain at her R ankle.  (11/30: 26%, 4/26: 29%, 6/19: 35%  )     Time  12    Period  Weeks    Status  Achieved      PT LONG TERM GOAL #3   Title  Pt will demo less B ankle instability on uneven ground, improved gait in order to minimize risk for injuries and falls     Time  12    Period  Weeks    Status  Revised      PT LONG TERM GOAL #4   Title  Pt will demo no pelvic obliquities and normal arthrokinematic motion of SIJ in order to minimize radiating pain to walk longer distances    Time  4     Period  Weeks    Status  Achieved      PT LONG TERM GOAL #5   Title  Pt will demo no pelvic floor mm tensions and proper ROM in order to eliminate urine and restore pelvic floor function    Time  12    Period  Weeks    Status  Achieved      PT LONG TERM GOAL #6   Title  Pt will demo decreased scar restrictions over abdomen, IND with abdominal massage, and report more regular bowel movements of 1-2 days in order to restore GI health    Time  12    Period  Weeks    Status  Achieved      PT LONG TERM GOAL #7   Title  Pt will demo no tensions/ tenderness at L sacrococcygeus ligament and coccygeus mm in order to sit for > 30 min and have no pain upon standing    Time  4    Period  Weeks    Status  Achieved      PT LONG TERM GOAL #8   Title  Pt will report no pain 100% of the time with crossing L ankle over R thigh and then getting up from chair  across 1 week in order to don shoes.     Time  4    Period  Weeks    Status  Achieved      PT LONG TERM GOAL  #9   TITLE  Pt will demo no lowered position of bladder and demo Grade 3/3/3/3 in order to improve pelvic girdle and back stability to increase physical activities without less risk for relapse    Time  8    Period  Weeks    Status  Partially Met      PT LONG TERM GOAL  #10   TITLE  Pt will increase plantarflexion reps from 10 reps B to >  20 reps B in order to climb stairs and progress towards balance training on uneven ground     Time  12    Period  Weeks    Status  New      PT LONG TERM GOAL  #11   TITLE  Pt will walk on uneven ground, moderate hills on a local nature trail for 1 mile for minimal pain < 2 /10 and no ankle sprain in order to enjoy her hobby     Time  12    Period  Weeks    Status  New      PT LONG TERM GOAL  #12   TITLE  Pt will be able to fast walking ( levelled ground) for 45 min and compliance to stretching afterwards in order to  minimize risk for prolapse .      Time  8    Period  Weeks    Status   New      PT LONG TERM GOAL  #13   TITLE  Pt will be able to climb stairs with proper technique , step-through pattern with one hand on rail up/ down across one flight in order to navigate community environments     Time  8    Period  Weeks    Status  New      PT LONG TERM GOAL  #14   TITLE  Pt will report no calf pain with stairs up and down or  after walking at a store for 15 min  in order to  progress towards dynamic balance exercises     Time  4    Period  Weeks    Status  New      PT LONG TERM GOAL  #15   TITLE  Pt will demo proper alignment and modifications with yoga to integrate into commuinity yoga classess with less risk for relapse of Sx     Time  12    Period  Weeks    Status  New            Plan - 04/18/18 1303    Clinical Impression Statement   After last session, pt reported her ankle range of motion continues to be increased with last Tx. And she had no LBP as long as she practiced body mechanics. But pt had a near fall after a tripping over a parking lot bar this past Sunday.  Pt had increased back pain since this incident. When she put her L foot down, the LBP occured.  8-9/10. Sitting here 4/10. This near fall did not affect her ankle/feet.  Upon assessment, pt 's pelvic alignment remained equal but L SIJ hypomobility was present. Manual Tx increased mobility there. L pelvic shift was present again today during gait which was addressed last session, indicating poor carry over. Addressed abdominal scar restrictions with gentle and toelrable manual techniques. Scars were from hysterectomy and a surgery to remove skin cancer.  Pt reported decreased LBP with walking to 2/10 following Tx.  Anticipate pt will have better carry over with pelvic arthokinematics with further scar releases.  Pt appeared tearful during appointment. Applied motivational interviewing and active listening. Pt and DPT discussed the use of chaplain services at [facility] to help with pt's stressful  situations. Pt expressed she would like to wait on seeking psychiatric services at the moment. DPT had referred pt to Dr. Nicolasa Ducking but her clinic did not accept [place] insurance. When pt is ready for psychiatric services,  pt would like to work with someone outside of the [place] because she expressed having "traumatic experiences" when working with her previous providers at the [place]. Pt has been consulted at the [facility] for her knee issues 04/09/18 and she expressed she would like to continue working with current DPT to continue addressing her knees.     Pt continues to benefit from skilled PT.     Rehab Potential  Good    PT Frequency  1x / week    PT Duration  12 weeks    PT Treatment/Interventions  Functional mobility training;Stair training;Gait training;Patient/family education;Aquatic Therapy;Therapeutic activities;Manual lymph drainage;Moist Heat;Neuromuscular re-education;Therapeutic exercise;Manual techniques;Taping;Energy conservation    Consulted and Agree with Plan of Care  Patient       Patient will benefit from skilled therapeutic intervention in order to improve the following deficits and impairments:  Abnormal gait, Pain, Increased fascial restricitons, Decreased strength, Decreased scar mobility, Increased muscle spasms, Difficulty walking, Decreased mobility, Decreased range of motion, Decreased balance, Decreased safety awareness, Decreased coordination, Postural dysfunction, Improper body mechanics, Decreased endurance  Visit Diagnosis: Myalgia  Pain in right ankle and joints of right foot  Pain in left ankle and joints of left foot  Chronic 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   [name] ,PT, DPT, E-RYT  04/18/2018, 1:04 PM  [place] MAIN [facility] SERVICES [address] [place], [place], [address] Phone: [phone]   Fax:  [phone]  Name:  AAREN KROG MRN: [id number] Date of Birth: 06/23/1961

## 2018-04-24 ENCOUNTER — Ambulatory Visit: Payer: No Typology Code available for payment source | Admitting: Physical Therapy

## 2018-04-24 ENCOUNTER — Encounter: Payer: Non-veteran care | Admitting: Physical Therapy

## 2018-04-24 DIAGNOSIS — G8929 Other chronic pain: Secondary | ICD-10-CM

## 2018-04-24 DIAGNOSIS — M25572 Pain in left ankle and joints of left foot: Secondary | ICD-10-CM

## 2018-04-24 DIAGNOSIS — M791 Myalgia, unspecified site: Secondary | ICD-10-CM

## 2018-04-24 DIAGNOSIS — M25571 Pain in right ankle and joints of right foot: Secondary | ICD-10-CM

## 2018-04-24 DIAGNOSIS — M5442 Lumbago with sciatica, left side: Secondary | ICD-10-CM

## 2018-04-25 NOTE — Therapy (Signed)
[place] MAIN [facility] SERVICES [address] [place], [place], [address] Phone: [phone]   Fax:  [phone]  Physical Therapy Treatment  Patient Details  Name: Carolyn Gregory MRN: [id number] Date of Birth: May 28, 1961 Referring Provider (PT): Timmie Foerster    Encounter Date: 04/24/2018  PT End of Session - 04/25/18 1853    Visit Number  16    Authorization Type  authorization ends 9/30 under [employer],   ( 2/22 visit  exp 11/15 )     PT Start Time  0810    PT Stop Time  0920    PT Time Calculation (min)  70 min    Activity Tolerance  Patient tolerated treatment well;No increased pain    Behavior During Therapy  WFL for tasks assessed/performed       Past Medical History:  Diagnosis Date  . Anxiety   . Depression   . Hypertension     Past Surgical History:  Procedure Laterality Date  . CHOLECYSTECTOMY    . MOHS SURGERY     to remove benign growth  located in the L low abdomen  . R ankle surgery   2016   to repair lateral tendon   . VAGINAL HYSTERECTOMY      There were no vitals filed for this visit.  Subjective Assessment - 04/25/18 1920    Subjective  Pt reports she has less radiating pain behind the posterior thigh since last session but it is still present. Yesterday and today, pt has not had this radiating pain. Pt has noticed tightness in B calves when doing the ankle exercises, but more severe cramping in the L.  Pt would like to work on her L foot/ knee.       Pertinent History  Pt is interested in seeking psychotherapy/ psychiatry for managing a stress in her life regarding a family member. Pt was tearful at the beginning of session.           [facility] PT Assessment - 04/25/18 1850      Palpation   Palpation comment  point tenderness along medial aspect of tibial plateau, tibialis anterior, mm L ( post Tx: not point tenderness)        Special Tests   Other special tests  SLR w/ DF on LLE with reproduction  of L knee pain pre Tx, no pain post Tx                   [facility] Adult PT Treatment/Exercise - 04/25/18 1849      Manual Therapy   Manual therapy comments  STM, fascial releases over medial aspect of L knee, lateral glides              PT Education - 04/25/18 1853    Education provided  Yes    Education Details  HEP    Person(s) Educated  Patient    Methods  Explanation;Demonstration;Tactile cues;Verbal cues;Handout    Comprehension  Returned demonstration;Verbalized understanding          PT Long Term Goals - 01/08/18 1107      PT LONG TERM GOAL #1   Title  Pt will decrease her ODI score from 48% to < 38% in order to return to ADLs ( 10/30: 46%, 11/30: 38%)     Time  12    Period  Weeks    Status  Achieved      PT LONG TERM GOAL #2   Title  Pt will decrease her LEFS score from   25% to > 30%  in order to walk and have less pain at her R ankle.  (11/30: 26%, 4/26: 29%, 6/19: 35%  )     Time  12    Period  Weeks    Status  Achieved      PT LONG TERM GOAL #3   Title  Pt will demo less B ankle instability on uneven ground, improved gait in order to minimize risk for injuries and falls     Time  12    Period  Weeks    Status  Revised      PT LONG TERM GOAL #4   Title  Pt will demo no pelvic obliquities and normal arthrokinematic motion of SIJ in order to minimize radiating pain to walk longer distances    Time  4    Period  Weeks    Status  Achieved      PT LONG TERM GOAL #5   Title  Pt will demo no pelvic floor mm tensions and proper ROM in order to eliminate urine and restore pelvic floor function    Time  12    Period  Weeks    Status  Achieved      PT LONG TERM GOAL #6   Title  Pt will demo decreased scar restrictions over abdomen, IND with abdominal massage, and report more regular bowel movements of 1-2 days in order to restore GI health    Time  12    Period  Weeks    Status  Achieved      PT LONG TERM GOAL #7   Title  Pt will demo no  tensions/ tenderness at L sacrococcygeus ligament and coccygeus mm in order to sit for > 30 min and have no pain upon standing    Time  4    Period  Weeks    Status  Achieved      PT LONG TERM GOAL #8   Title  Pt will report no pain 100% of the time with crossing L ankle over R thigh and then getting up from chair  across 1 week in order to don shoes.     Time  4    Period  Weeks    Status  Achieved      PT LONG TERM GOAL  #9   TITLE  Pt will demo no lowered position of bladder and demo Grade 3/3/3/3 in order to improve pelvic girdle and back stability to increase physical activities without less risk for relapse    Time  8    Period  Weeks    Status  Partially Met      PT LONG TERM GOAL  #10   TITLE  Pt will increase plantarflexion reps from 10 reps B to > 20 reps B in order to climb stairs and progress towards balance training on uneven ground     Time  12    Period  Weeks    Status  New      PT LONG TERM GOAL  #11   TITLE  Pt will walk on uneven ground, moderate hills on a local nature trail for 1 mile for minimal pain < 2 /10 and no ankle sprain in order to enjoy her hobby     Time  12    Period  Weeks    Status  New      PT LONG TERM GOAL  #12  TITLE  Pt will be able to fast walking ( levelled ground) for 45 min and compliance to stretching afterwards in order to  minimize risk for prolapse .      Time  8    Period  Weeks    Status  New      PT LONG TERM GOAL  #13   TITLE  Pt will be able to climb stairs with proper technique , step-through pattern with one hand on rail up/ down across one flight in order to navigate community environments     Time  8    Period  Weeks    Status  New      PT LONG TERM GOAL  #14   TITLE  Pt will report no calf pain with stairs up and down or  after walking at a store for 15 min  in order to  progress towards dynamic balance exercises     Time  4    Period  Weeks    Status  New      PT LONG TERM GOAL  #15   TITLE  Pt will demo  proper alignment and modifications with yoga to integrate into commuinity yoga classess with less risk for relapse of Sx     Time  12    Period  Weeks    Status  New            Plan - 04/25/18 1854    Clinical Impression Statement  Pt     Rehab Potential  Good    PT Frequency  1x / week    PT Duration  12 weeks    PT Treatment/Interventions  Functional mobility training;Stair training;Gait training;Patient/family education;Aquatic Therapy;Therapeutic activities;Manual lymph drainage;Moist Heat;Neuromuscular re-education;Therapeutic exercise;Manual techniques;Taping;Energy conservation    Consulted and Agree with Plan of Care  Patient       Patient will benefit from skilled therapeutic intervention in order to improve the following deficits and impairments:  Abnormal gait, Pain, Increased fascial restricitons, Decreased strength, Decreased scar mobility, Increased muscle spasms, Difficulty walking, Decreased mobility, Decreased range of motion, Decreased balance, Decreased safety awareness, Decreased coordination, Postural dysfunction, Improper body mechanics, Decreased endurance  Visit Diagnosis: Myalgia  Pain in right ankle and joints of right foot  Pain in left ankle and joints of left foot  Chronic 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   [name] ,PT, DPT, E-RYT  04/25/2018, 7:20 PM  [place] MAIN [facility] SERVICES [address] [place], [place], [address] Phone: [phone]   Fax:  [phone]  Name: Carolyn Gregory MRN: [id number] Date of Birth: 12/24/1960

## 2018-04-25 NOTE — Patient Instructions (Signed)
Modified deep core with heel slides, toes pointed up

## 2018-04-28 ENCOUNTER — Ambulatory Visit: Payer: No Typology Code available for payment source | Admitting: Physical Therapy

## 2018-04-28 DIAGNOSIS — M5442 Lumbago with sciatica, left side: Secondary | ICD-10-CM

## 2018-04-28 DIAGNOSIS — M25572 Pain in left ankle and joints of left foot: Secondary | ICD-10-CM

## 2018-04-28 DIAGNOSIS — M791 Myalgia, unspecified site: Secondary | ICD-10-CM

## 2018-04-28 DIAGNOSIS — M25571 Pain in right ankle and joints of right foot: Secondary | ICD-10-CM

## 2018-04-28 DIAGNOSIS — G8929 Other chronic pain: Secondary | ICD-10-CM

## 2018-04-28 NOTE — Patient Instructions (Signed)
Ankle pumps with eversion swipe out of pinky toes  30 reps  In bed    Walking with higher thighs, shoulders over sin bones, spreading transverse arches

## 2018-04-29 NOTE — Therapy (Signed)
[place] MAIN [facility] SERVICES [address] [place], [place], [address] Phone: [phone]   Fax:  [phone]  Physical Therapy Treatment  Patient Details  Name: Carolyn Gregory MRN: [id number] Date of Birth: 12/20/1960 Referring Provider (PT): Timmie Foerster    Encounter Date: 04/28/2018  PT End of Session - 04/28/18 1018    Visit Number  17    Authorization Type  authorization ends 9/30 under [employer],   ( 6/54 visit  exp 11/15 )     PT Start Time  1015    PT Stop Time  1108    PT Time Calculation (min)  53 min    Activity Tolerance  Patient tolerated treatment well;No increased pain    Behavior During Therapy  WFL for tasks assessed/performed       Past Medical History:  Diagnosis Date  . Anxiety   . Depression   . Hypertension     Past Surgical History:  Procedure Laterality Date  . CHOLECYSTECTOMY    . MOHS SURGERY     to remove benign growth  located in the L low abdomen  . R ankle surgery   2016   to repair lateral tendon   . VAGINAL HYSTERECTOMY      There were no vitals filed for this visit.  Subjective Assessment - 04/28/18 1017    Subjective  Pt reports the L knee pain is better by 60% after last treatment. Pt would like to work on her L foot. The 2nd digit has alot of pain when she is trying to walking to push off.  She has no more radiating LBP.    Pertinent History  Pt is interested in seeking psychotherapy/ psychiatry for managing a stress in her life regarding a family member. Pt was tearful at the beginning of session.           [facility] PT Assessment - 04/29/18 1237      Observation/Other Assessments   Observations  L SLR no pain, limited ankle DF/EV B       Palpation   Palpation comment  increased peroneal longus/ brevis distally L, increased tightness between ray I-II L,  increased tightness at proximal peroneal longus, B  extensor digitorium B tghtness        Ambulation/Gait   Gait Comments   posterior COM, heel striking, decreasd hip flexion B                    [facility] Adult PT Treatment/Exercise - 04/29/18 1237      Neuro Re-ed    Neuro Re-ed Details   see pt instructions       Manual Therapy   Manual therapy comments  STM with MWM at problem areas noted in assessment                   PT Long Term Goals - 01/08/18 1107      PT LONG TERM GOAL #1   Title  Pt will decrease her ODI score from 48% to < 38% in order to return to ADLs ( 10/30: 46%, 11/30: 38%)     Time  12    Period  Weeks    Status  Achieved      PT LONG TERM GOAL #2   Title  Pt will decrease her LEFS score from   25% to > 30%  in order to walk and have less pain at her R ankle.  (11/30: 26%,  4/26: 29%, 6/19: 35%  )     Time  12    Period  Weeks    Status  Achieved      PT LONG TERM GOAL #3   Title  Pt will demo less B ankle instability on uneven ground, improved gait in order to minimize risk for injuries and falls     Time  12    Period  Weeks    Status  Revised      PT LONG TERM GOAL #4   Title  Pt will demo no pelvic obliquities and normal arthrokinematic motion of SIJ in order to minimize radiating pain to walk longer distances    Time  4    Period  Weeks    Status  Achieved      PT LONG TERM GOAL #5   Title  Pt will demo no pelvic floor mm tensions and proper ROM in order to eliminate urine and restore pelvic floor function    Time  12    Period  Weeks    Status  Achieved      PT LONG TERM GOAL #6   Title  Pt will demo decreased scar restrictions over abdomen, IND with abdominal massage, and report more regular bowel movements of 1-2 days in order to restore GI health    Time  12    Period  Weeks    Status  Achieved      PT LONG TERM GOAL #7   Title  Pt will demo no tensions/ tenderness at L sacrococcygeus ligament and coccygeus mm in order to sit for > 30 min and have no pain upon standing    Time  4    Period  Weeks    Status  Achieved      PT LONG TERM  GOAL #8   Title  Pt will report no pain 100% of the time with crossing L ankle over R thigh and then getting up from chair  across 1 week in order to don shoes.     Time  4    Period  Weeks    Status  Achieved      PT LONG TERM GOAL  #9   TITLE  Pt will demo no lowered position of bladder and demo Grade 3/3/3/3 in order to improve pelvic girdle and back stability to increase physical activities without less risk for relapse    Time  8    Period  Weeks    Status  Partially Met      PT LONG TERM GOAL  #10   TITLE  Pt will increase plantarflexion reps from 10 reps B to > 20 reps B in order to climb stairs and progress towards balance training on uneven ground     Time  12    Period  Weeks    Status  New      PT LONG TERM GOAL  #11   TITLE  Pt will walk on uneven ground, moderate hills on a local nature trail for 1 mile for minimal pain < 2 /10 and no ankle sprain in order to enjoy her hobby     Time  12    Period  Weeks    Status  New      PT LONG TERM GOAL  #12   TITLE  Pt will be able to fast walking ( levelled ground) for 45 min and compliance to stretching afterwards in order to  minimize risk for  prolapse .      Time  8    Period  Weeks    Status  New      PT LONG TERM GOAL  #13   TITLE  Pt will be able to climb stairs with proper technique , step-through pattern with one hand on rail up/ down across one flight in order to navigate community environments     Time  8    Period  Weeks    Status  New      PT LONG TERM GOAL  #14   TITLE  Pt will report no calf pain with stairs up and down or  after walking at a store for 15 min  in order to  progress towards dynamic balance exercises     Time  4    Period  Weeks    Status  New      PT LONG TERM GOAL  #15   TITLE  Pt will demo proper alignment and modifications with yoga to integrate into commuinity yoga classess with less risk for relapse of Sx     Time  12    Period  Weeks    Status  New            Plan -  04/28/18 1107    Clinical Impression Statement  Pt continues to not have radiating pain from LBP anymore. Pt demo'd decreased mm tighteness around L medial knee from last session. Focused today with manual Tx to decrease mm tightness at  L lateral leg ( some time spent on R leg as well) with peroneal mm at proximal and distal attachments.  Pt continues to benefit form skilled PT.      Rehab Potential  Good    PT Frequency  1x / week    PT Duration  12 weeks    PT Treatment/Interventions  Functional mobility training;Stair training;Gait training;Patient/family education;Aquatic Therapy;Therapeutic activities;Manual lymph drainage;Moist Heat;Neuromuscular re-education;Therapeutic exercise;Manual techniques;Taping;Energy conservation    Consulted and Agree with Plan of Care  Patient       Patient will benefit from skilled therapeutic intervention in order to improve the following deficits and impairments:  Abnormal gait, Pain, Increased fascial restricitons, Decreased strength, Decreased scar mobility, Increased muscle spasms, Difficulty walking, Decreased mobility, Decreased range of motion, Decreased balance, Decreased safety awareness, Decreased coordination, Postural dysfunction, Improper body mechanics, Decreased endurance  Visit Diagnosis: Myalgia  Pain in right ankle and joints of right foot  Pain in left ankle and joints of left foot  Chronic 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   [name] ,PT, DPT, E-RYT  04/29/2018, 12:41 PM  [place] MAIN [facility] SERVICES [address] [place], [place], [address] Phone: [phone]   Fax:  [phone]  Name: Carolyn Gregory MRN: [id number] Date of Birth: October 28, 1960

## 2018-04-30 ENCOUNTER — Ambulatory Visit: Payer: No Typology Code available for payment source | Admitting: Physical Therapy

## 2018-04-30 DIAGNOSIS — G8929 Other chronic pain: Secondary | ICD-10-CM

## 2018-04-30 DIAGNOSIS — M5442 Lumbago with sciatica, left side: Secondary | ICD-10-CM

## 2018-04-30 DIAGNOSIS — M791 Myalgia, unspecified site: Secondary | ICD-10-CM | POA: Diagnosis not present

## 2018-04-30 DIAGNOSIS — M25572 Pain in left ankle and joints of left foot: Secondary | ICD-10-CM

## 2018-04-30 DIAGNOSIS — M25571 Pain in right ankle and joints of right foot: Secondary | ICD-10-CM

## 2018-04-30 NOTE — Patient Instructions (Addendum)
Seated,  Slide foot back on floor and lift heel up  10 reps    Wear pedicure toe spreaders 30 min. Place them with top part up to be safe when needing to walk

## 2018-04-30 NOTE — Therapy (Signed)
[place] MAIN [facility] SERVICES [address] [place], [place], [address] Phone: [phone]   Fax:  [phone]  Physical Therapy Treatment  Patient Details  Name: Carolyn Gregory MRN: [id number] Date of Birth: 1960/09/06 Referring Provider (PT): Timmie Foerster    Encounter Date: 04/30/2018  PT End of Session - 04/30/18 1534    Visit Number  18    Authorization Type  authorization ends 9/30 under [employer],   ( 17/79 visit  exp 11/15 )     PT Start Time  1110    PT Stop Time  1215    PT Time Calculation (min)  65 min    Activity Tolerance  Patient tolerated treatment well;No increased pain    Behavior During Therapy  WFL for tasks assessed/performed       Past Medical History:  Diagnosis Date  . Anxiety   . Depression   . Hypertension     Past Surgical History:  Procedure Laterality Date  . CHOLECYSTECTOMY    . MOHS SURGERY     to remove benign growth  located in the L low abdomen  . R ankle surgery   2016   to repair lateral tendon   . VAGINAL HYSTERECTOMY      There were no vitals filed for this visit.  Subjective Assessment - 04/30/18 1111    Subjective  Pt reports that  treatment from last visit which focused on the L lateral leg did directly address the Sx of pain where she had felt the L ankle pain. The pain now has decreased to 2/10 on the outer ankle.  With pushoff during walking, she had noticed minor improvement the 2nd toe and ballmound area. The R leg needs attention. Recent images show the arthritis condition of the R knee  has worsened over time.      Pertinent History  Pt is interested in seeking psychotherapy/ psychiatry for managing a stress in her life regarding a family member. Pt was tearful at the beginning of session.           [facility] PT Assessment - 04/30/18 1121      Palpation   Palpation comment  increased tibalis anterior,  R,extensor digitorium, extensor retinacuulum, hallusic adductus (  transverse head), interroseus between I-V        Ambulation/Gait   Gait velocity  0.64ms                    [facility] Adult PT Treatment/Exercise - 04/30/18 1121      Ambulation/Gait   Gait Comments  pain with inner front sole and outer ankle of R, increased hip flexion caused more pain in the ballmounds area with stance phase,  ( post Tx: no pain)     .      Manual Therapy   Manual therapy comments  STM with MWM at problem areas noted in assessment                   PT Long Term Goals - 01/08/18 1107      PT LONG TERM GOAL #1   Title  Pt will decrease her ODI score from 48% to < 38% in order to return to ADLs ( 10/30: 46%, 11/30: 38%)     Time  12    Period  Weeks    Status  Achieved      PT LONG TERM GOAL #2   Title  Pt will decrease her LEFS  score from   25% to > 30%  in order to walk and have less pain at her R ankle.  (11/30: 26%, 4/26: 29%, 6/19: 35%  )     Time  12    Period  Weeks    Status  Achieved      PT LONG TERM GOAL #3   Title  Pt will demo less B ankle instability on uneven ground, improved gait in order to minimize risk for injuries and falls     Time  12    Period  Weeks    Status  Revised      PT LONG TERM GOAL #4   Title  Pt will demo no pelvic obliquities and normal arthrokinematic motion of SIJ in order to minimize radiating pain to walk longer distances    Time  4    Period  Weeks    Status  Achieved      PT LONG TERM GOAL #5   Title  Pt will demo no pelvic floor mm tensions and proper ROM in order to eliminate urine and restore pelvic floor function    Time  12    Period  Weeks    Status  Achieved      PT LONG TERM GOAL #6   Title  Pt will demo decreased scar restrictions over abdomen, IND with abdominal massage, and report more regular bowel movements of 1-2 days in order to restore GI health    Time  12    Period  Weeks    Status  Achieved      PT LONG TERM GOAL #7   Title  Pt will demo no tensions/ tenderness at L  sacrococcygeus ligament and coccygeus mm in order to sit for > 30 min and have no pain upon standing    Time  4    Period  Weeks    Status  Achieved      PT LONG TERM GOAL #8   Title  Pt will report no pain 100% of the time with crossing L ankle over R thigh and then getting up from chair  across 1 week in order to don shoes.     Time  4    Period  Weeks    Status  Achieved      PT LONG TERM GOAL  #9   TITLE  Pt will demo no lowered position of bladder and demo Grade 3/3/3/3 in order to improve pelvic girdle and back stability to increase physical activities without less risk for relapse    Time  8    Period  Weeks    Status  Partially Met      PT LONG TERM GOAL  #10   TITLE  Pt will increase plantarflexion reps from 10 reps B to > 20 reps B in order to climb stairs and progress towards balance training on uneven ground     Time  12    Period  Weeks    Status  New      PT LONG TERM GOAL  #11   TITLE  Pt will walk on uneven ground, moderate hills on a local nature trail for 1 mile for minimal pain < 2 /10 and no ankle sprain in order to enjoy her hobby     Time  12    Period  Weeks    Status  New      PT LONG TERM GOAL  #12   TITLE  Pt  will be able to fast walking ( levelled ground) for 45 min and compliance to stretching afterwards in order to  minimize risk for prolapse .      Time  8    Period  Weeks    Status  New      PT LONG TERM GOAL  #13   TITLE  Pt will be able to climb stairs with proper technique , step-through pattern with one hand on rail up/ down across one flight in order to navigate community environments     Time  8    Period  Weeks    Status  New      PT LONG TERM GOAL  #14   TITLE  Pt will report no calf pain with stairs up and down or  after walking at a store for 15 min  in order to  progress towards dynamic balance exercises     Time  4    Period  Weeks    Status  New      PT LONG TERM GOAL  #15   TITLE  Pt will demo proper alignment and  modifications with yoga to integrate into commuinity yoga classess with less risk for relapse of Sx     Time  12    Period  Weeks    Status  New            Plan - 04/30/18 1534    Clinical Impression Statement  Pt demo'd significantly decreased tightness along R lateral leg and ankle post Tx and had positive carry over from last Tx on L leg. Pt increased her gait speed and reported less pain at the ballmounds of her feet post Tx.  Pt continues to benefit from skilled PT.     Rehab Potential  Good    PT Frequency  1x / week    PT Duration  12 weeks    PT Treatment/Interventions  Functional mobility training;Stair training;Gait training;Patient/family education;Aquatic Therapy;Therapeutic activities;Manual lymph drainage;Moist Heat;Neuromuscular re-education;Therapeutic exercise;Manual techniques;Taping;Energy conservation    Consulted and Agree with Plan of Care  Patient       Patient will benefit from skilled therapeutic intervention in order to improve the following deficits and impairments:  Abnormal gait, Pain, Increased fascial restricitons, Decreased strength, Decreased scar mobility, Increased muscle spasms, Difficulty walking, Decreased mobility, Decreased range of motion, Decreased balance, Decreased safety awareness, Decreased coordination, Postural dysfunction, Improper body mechanics, Decreased endurance  Visit Diagnosis: Myalgia  Pain in right ankle and joints of right foot  Pain in left ankle and joints of left foot  Chronic 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   [name] ,PT, DPT, E-RYT  04/30/2018, 8:15 PM  [place] MAIN [facility] SERVICES [address] [place], [place], [address] Phone: [phone]   Fax:  [phone]  Name: Carolyn Gregory MRN: [id number] Date of Birth: 1961/01/30

## 2018-05-15 ENCOUNTER — Ambulatory Visit: Payer: No Typology Code available for payment source | Attending: Internal Medicine | Admitting: Physical Therapy

## 2018-05-15 DIAGNOSIS — M25571 Pain in right ankle and joints of right foot: Secondary | ICD-10-CM

## 2018-05-15 DIAGNOSIS — M791 Myalgia, unspecified site: Secondary | ICD-10-CM

## 2018-05-15 DIAGNOSIS — M25572 Pain in left ankle and joints of left foot: Secondary | ICD-10-CM | POA: Diagnosis present

## 2018-05-15 DIAGNOSIS — M5442 Lumbago with sciatica, left side: Secondary | ICD-10-CM | POA: Diagnosis present

## 2018-05-15 DIAGNOSIS — G8929 Other chronic pain: Secondary | ICD-10-CM | POA: Diagnosis present

## 2018-05-15 NOTE — Patient Instructions (Signed)
Massage between the rays of the top of foot    Standing   slight knee bend, cyclic the other foot    Walking with higher thighs, land ballmound, heel  Knee bent on supporting leg

## 2018-05-16 NOTE — Therapy (Signed)
[place] MAIN [facility] SERVICES [address] [place], [place], [address] Phone: [phone]   Fax:  [phone]  Physical Therapy Treatment  Patient Details  Name: Carolyn Gregory MRN: [id number] Date of Birth: Aug 19, 1960 Referring Provider (PT): Timmie Foerster    Encounter Date: 05/15/2018  PT End of Session - 05/16/18 1551    Visit Number  19    Authorization Type  authorization ends 9/30 under [employer],   ( 50/53 visit  exp 11/15 )     PT Start Time  1100    PT Stop Time  1205    PT Time Calculation (min)  65 min    Activity Tolerance  Patient tolerated treatment well;No increased pain    Behavior During Therapy  WFL for tasks assessed/performed       Past Medical History:  Diagnosis Date  . Anxiety   . Depression   . Hypertension     Past Surgical History:  Procedure Laterality Date  . CHOLECYSTECTOMY    . MOHS SURGERY     to remove benign growth  located in the L low abdomen  . R ankle surgery   2016   to repair lateral tendon   . VAGINAL HYSTERECTOMY      There were no vitals filed for this visit.  Subjective Assessment - 05/15/18 1105    Subjective  Pt reported some relief for overall pain in both legs, feet, and ankle 6/10 which stayed for 1 week after last session. It increased with more walking.  After 5 minutes of walking, pt no longer has shooting pain from the buttocks down the leg, only throbbing pain  B ankles. The L knee buckles when she is pushing off . The pain occurs the top of her foot below her toes and under the ballmound of big toe.      Pertinent History  Pt is interested in seeking psychotherapy/ psychiatry for managing a stress in her life regarding a family member. Pt was tearful at the beginning of session.           [facility] PT Assessment - 05/15/18 1129      Strength   Overall Strength Comments  L knee flex 3+/5, R 4/5      Palpation   Palpation comment  L flexor hallucis brevis ,  interreous mm tightness between ray I-V                     [facility] Adult PT Treatment/Exercise - 05/15/18 1129      Ambulation/Gait   Gait Comments  decreased eccentric control of heel, hyperextension of knee in swing trough to stance,  ( post Tx : no pain w/ knee/ ballmount of 1st toe/ top of foot  when walking)                    PT Long Term Goals - 01/08/18 1107      PT LONG TERM GOAL #1   Title  Pt will decrease her ODI score from 48% to < 38% in order to return to ADLs ( 10/30: 46%, 11/30: 38%)     Time  12    Period  Weeks    Status  Achieved      PT LONG TERM GOAL #2   Title  Pt will decrease her LEFS score from   25% to > 30%  in order to walk and have less pain at her R ankle.  (  11/30: 26%, 4/26: 29%, 6/19: 35%  )     Time  12    Period  Weeks    Status  Achieved      PT LONG TERM GOAL #3   Title  Pt will demo less B ankle instability on uneven ground, improved gait in order to minimize risk for injuries and falls     Time  12    Period  Weeks    Status  Revised      PT LONG TERM GOAL #4   Title  Pt will demo no pelvic obliquities and normal arthrokinematic motion of SIJ in order to minimize radiating pain to walk longer distances    Time  4    Period  Weeks    Status  Achieved      PT LONG TERM GOAL #5   Title  Pt will demo no pelvic floor mm tensions and proper ROM in order to eliminate urine and restore pelvic floor function    Time  12    Period  Weeks    Status  Achieved      PT LONG TERM GOAL #6   Title  Pt will demo decreased scar restrictions over abdomen, IND with abdominal massage, and report more regular bowel movements of 1-2 days in order to restore GI health    Time  12    Period  Weeks    Status  Achieved      PT LONG TERM GOAL #7   Title  Pt will demo no tensions/ tenderness at L sacrococcygeus ligament and coccygeus mm in order to sit for > 30 min and have no pain upon standing    Time  4    Period  Weeks    Status   Achieved      PT LONG TERM GOAL #8   Title  Pt will report no pain 100% of the time with crossing L ankle over R thigh and then getting up from chair  across 1 week in order to don shoes.     Time  4    Period  Weeks    Status  Achieved      PT LONG TERM GOAL  #9   TITLE  Pt will demo no lowered position of bladder and demo Grade 3/3/3/3 in order to improve pelvic girdle and back stability to increase physical activities without less risk for relapse    Time  8    Period  Weeks    Status  Partially Met      PT LONG TERM GOAL  #10   TITLE  Pt will increase plantarflexion reps from 10 reps B to > 20 reps B in order to climb stairs and progress towards balance training on uneven ground     Time  12    Period  Weeks    Status  New      PT LONG TERM GOAL  #11   TITLE  Pt will walk on uneven ground, moderate hills on a local nature trail for 1 mile for minimal pain < 2 /10 and no ankle sprain in order to enjoy her hobby     Time  12    Period  Weeks    Status  New      PT LONG TERM GOAL  #12   TITLE  Pt will be able to fast walking ( levelled ground) for 45 min and compliance to stretching afterwards in order to  minimize  risk for prolapse .      Time  8    Period  Weeks    Status  New      PT LONG TERM GOAL  #13   TITLE  Pt will be able to climb stairs with proper technique , step-through pattern with one hand on rail up/ down across one flight in order to navigate community environments     Time  8    Period  Weeks    Status  New      PT LONG TERM GOAL  #14   TITLE  Pt will report no calf pain with stairs up and down or  after walking at a store for 15 min  in order to  progress towards dynamic balance exercises     Time  4    Period  Weeks    Status  New      PT LONG TERM GOAL  #15   TITLE  Pt will demo proper alignment and modifications with yoga to integrate into commuinity yoga classess with less risk for relapse of Sx     Time  12    Period  Weeks    Status  New             Plan - 05/16/18 1551    Clinical Impression Statement  Pt is able to walk with less radiating LBP pain and not has pain primarily in leg, ankle, feet ( L > R).  Manual Tx that helped to mobilize midfoot joints and increase toe abduction and wider transverse arch provided a decrease of overall pain in BLE legs from 6/10 to 3/10. Pt demo'd less knee hyperextension, more contact through fore/mid/hind foot in heel strike/stance/ push off following manual Tx and training. Pt continue progress towards her goals. Pt continues to benefit from skilled PT.     Rehab Potential  Good    PT Frequency  1x / week    PT Duration  12 weeks    PT Treatment/Interventions  Functional mobility training;Stair training;Gait training;Patient/family education;Aquatic Therapy;Therapeutic activities;Manual lymph drainage;Moist Heat;Neuromuscular re-education;Therapeutic exercise;Manual techniques;Taping;Energy conservation    Consulted and Agree with Plan of Care  Patient       Patient will benefit from skilled therapeutic intervention in order to improve the following deficits and impairments:  Abnormal gait, Pain, Increased fascial restricitons, Decreased strength, Decreased scar mobility, Increased muscle spasms, Difficulty walking, Decreased mobility, Decreased range of motion, Decreased balance, Decreased safety awareness, Decreased coordination, Postural dysfunction, Improper body mechanics, Decreased endurance  Visit Diagnosis: Myalgia  Pain in right ankle and joints of right foot  Pain in left ankle and joints of left foot  Chronic 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   [name] ,PT, DPT, E-RYT  05/16/2018, 3:55 PM  [place] MAIN [facility] SERVICES [address] [place], [place], [address] Phone: [phone]   Fax:  [phone]  Name: Carolyn Gregory MRN: [id number] Date of Birth: 02-07-1961

## 2018-05-22 ENCOUNTER — Ambulatory Visit: Payer: No Typology Code available for payment source | Admitting: Physical Therapy

## 2018-05-22 DIAGNOSIS — M791 Myalgia, unspecified site: Secondary | ICD-10-CM | POA: Diagnosis not present

## 2018-05-22 DIAGNOSIS — M5442 Lumbago with sciatica, left side: Secondary | ICD-10-CM

## 2018-05-22 DIAGNOSIS — M25571 Pain in right ankle and joints of right foot: Secondary | ICD-10-CM

## 2018-05-22 DIAGNOSIS — G8929 Other chronic pain: Secondary | ICD-10-CM

## 2018-05-22 DIAGNOSIS — M25572 Pain in left ankle and joints of left foot: Secondary | ICD-10-CM

## 2018-05-22 NOTE — Patient Instructions (Signed)
Stirrup exercise with red band on R leg only 30 reps x 3 ( step up)    Seated 4 four corner of feet slides , toes spread, toes slightly lighted ( transverse arch )  30 reps before knee pain starts    Quad stretches sidelying or standing

## 2018-05-23 NOTE — Therapy (Addendum)
[place] MAIN [facility] SERVICES [address] [place], [place], [address] Phone: [phone]   Fax:  [phone]  Physical Therapy Treatment / Progress Note  Patient Details  Name: Carolyn Gregory MRN: [id number] Date of Birth: [birth year] Referring Provider (PT): Timmie Foerster    Encounter Date: 05/22/2018  PT End of Session - 05/23/18 4401    Visit Number  20    Authorization Type  recert submitted to Dr. Forbes Cellar  05/23/18:   1/15 authorized visits     PT Start Time  1005    PT Stop Time  1115    PT Time Calculation (min)  70 min    Activity Tolerance  Patient tolerated treatment well;No increased pain    Behavior During Therapy  WFL for tasks assessed/performed       Past Medical History:  Diagnosis Date  . Anxiety   . Depression   . Hypertension     Past Surgical History:  Procedure Laterality Date  . CHOLECYSTECTOMY    . MOHS SURGERY     to remove benign growth  located in the L low abdomen  . R ankle surgery   2016   to repair lateral tendon   . VAGINAL HYSTERECTOMY      There were no vitals filed for this visit.  Subjective Assessment - 05/22/18 0909    Subjective  Pt reported since last session, there is no more shooting pain in the R ankle.  Pt felt fine after the session for 2 days. Pt notices there is pain under the large toe ballmound with landing and push-off in both feet. There is still pain between I-II digit and top of the foot below the toes on the L foot.  There is pain on the lateral part of B knees when walking.  All areas below the knee cap are involved with stair stepping.  The locking of the knees have resolved since last session.     Pertinent History  Pt is interested in seeking psychotherapy/ psychiatry for managing a stress in her life regarding a family member. Pt was tearful at the beginning of session.           [facility] PT Assessment - 05/22/18 0920      AROM   Overall AROM Comments  SLR L 140 deg,  R 115 deg      Significant increased tightness along lateral tibial plateau B, extensor digitorium/ peroneal longus/brevis, posterior tibilalis, quad attachments , superior glide of fibular head on R limited   Tightness at adductor hallucis transverse head on L ( more at digit II-III)    Gait: minimal knee flexion                            PT Long Term Goals - 01/08/18 1107      PT LONG TERM GOAL #1   Title  Pt will decrease her ODI score from 48% to < 38% in order to return to ADLs ( 10/30: 46%, 11/30: 38%)     Time  12    Period  Weeks    Status  Achieved      PT LONG TERM GOAL #2   Title  Pt will decrease her LEFS score from   25% to > 30%  in order to walk and have less pain at her R ankle.  (11/30: 26%, 4/26: 29%, 6/19: 35%  )     Time  12    Period  Weeks    Status  Achieved      PT LONG TERM GOAL #3   Title  Pt will demo less B ankle instability on uneven ground, improved gait in order to minimize risk for injuries and falls     Time  12    Period  Weeks    Status  Revised      PT LONG TERM GOAL #4   Title  Pt will demo no pelvic obliquities and normal arthrokinematic motion of SIJ in order to minimize radiating pain to walk longer distances    Time  4    Period  Weeks    Status  Achieved      PT LONG TERM GOAL #5   Title  Pt will demo no pelvic floor mm tensions and proper ROM in order to eliminate urine and restore pelvic floor function    Time  12    Period  Weeks    Status  Achieved      PT LONG TERM GOAL #6   Title  Pt will demo decreased scar restrictions over abdomen, IND with abdominal massage, and report more regular bowel movements of 1-2 days in order to restore GI health    Time  12    Period  Weeks    Status  Achieved      PT LONG TERM GOAL #7   Title  Pt will demo no tensions/ tenderness at L sacrococcygeus ligament and coccygeus mm in order to sit for > 30 min and have no pain upon standing    Time  4    Period  Weeks     Status  Achieved      PT LONG TERM GOAL #8   Title  Pt will report no pain 100% of the time with crossing L ankle over R thigh and then getting up from chair  across 1 week in order to don shoes.     Time  4    Period  Weeks    Status  Achieved      PT LONG TERM GOAL  #9   TITLE  Pt will demo no lowered position of bladder and demo Grade 3/3/3/3 in order to improve pelvic girdle and back stability to increase physical activities without less risk for relapse    Time  8    Period  Weeks    Status  Partially Met      PT LONG TERM GOAL  #10   TITLE  Pt will increase plantarflexion reps from 10 reps B to > 20 reps B in order to climb stairs and progress towards balance training on uneven ground     Time  12    Period  Weeks    Status  New      PT LONG TERM GOAL  #11   TITLE  Pt will walk on uneven ground, moderate hills on a local nature trail for 1 mile for minimal pain < 2 /10 and no ankle sprain in order to enjoy her hobby     Time  12    Period  Weeks    Status  New      PT LONG TERM GOAL  #12   TITLE  Pt will be able to fast walking ( levelled ground) for 45 min and compliance to stretching afterwards in order to  minimize risk for prolapse .      Time  8  Period  Weeks    Status  New      PT LONG TERM GOAL  #13   TITLE  Pt will be able to climb stairs with proper technique , step-through pattern with one hand on rail up/ down across one flight in order to navigate community environments     Time  8    Period  Weeks    Status  New      PT LONG TERM GOAL  #14   TITLE  Pt will report no calf pain with stairs up and down or  after walking at a store for 15 min  in order to  progress towards dynamic balance exercises     Time  4    Period  Weeks    Status  New      PT LONG TERM GOAL  #15   TITLE  Pt will demo proper alignment and modifications with yoga to integrate into commuinity yoga classess with less risk for relapse of Sx     Time  12    Period  Weeks    Status   New            Plan - 05/22/18 1001    Clinical Impression Statement Pt has achieved 9/14 goals and is progressing well toward remaining goals. Pelvic pain and LBP has improved signficantly with decreased mm tensions and increased joint mobility. Currently, addressing BLE lower kinetic chain complaints which impact walking. Today, after manual Tx, pt reported 50% less pain under the ballmound of L foot decreased to 3/10 and no pain in the knees after today's treatment.  Pt continues to benefit from skilled PT.     Rehab Potential  Good    PT Frequency  1x / week    PT Duration  12 weeks    PT Treatment/Interventions  Functional mobility training;Stair training;Gait training;Patient/family education;Aquatic Therapy;Therapeutic activities;Manual lymph drainage;Moist Heat;Neuromuscular re-education;Therapeutic exercise;Manual techniques;Taping;Energy conservation    Consulted and Agree with Plan of Care  Patient       Patient will benefit from skilled therapeutic intervention in order to improve the following deficits and impairments:  Abnormal gait, Pain, Increased fascial restricitons, Decreased strength, Decreased scar mobility, Increased muscle spasms, Difficulty walking, Decreased mobility, Decreased range of motion, Decreased balance, Decreased safety awareness, Decreased coordination, Postural dysfunction, Improper body mechanics, Decreased endurance  Visit Diagnosis: Myalgia  Pain in right ankle and joints of right foot  Pain in left ankle and joints of left foot  Chronic 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   [name]  ,PT, DPT, E-RYT'  05/23/2018, 2:48 PM  [place] MAIN [facility] SERVICES [address] [place], [place], [address] Phone: [phone]   Fax:  [phone]  Name: Carolyn Gregory MRN: [id number] Date of Birth:  1960/12/04

## 2018-05-27 NOTE — Addendum Note (Signed)
Addended by: Mariane MastersYEUNG, SHIN-YIING on: 05/27/2018 02:06 PM   Modules accepted: Orders

## 2018-05-29 ENCOUNTER — Ambulatory Visit: Payer: No Typology Code available for payment source | Admitting: Physical Therapy

## 2018-05-29 DIAGNOSIS — M791 Myalgia, unspecified site: Secondary | ICD-10-CM | POA: Diagnosis not present

## 2018-05-29 DIAGNOSIS — M5442 Lumbago with sciatica, left side: Secondary | ICD-10-CM

## 2018-05-29 DIAGNOSIS — G8929 Other chronic pain: Secondary | ICD-10-CM

## 2018-05-29 DIAGNOSIS — M25572 Pain in left ankle and joints of left foot: Secondary | ICD-10-CM

## 2018-05-29 DIAGNOSIS — M25571 Pain in right ankle and joints of right foot: Secondary | ICD-10-CM

## 2018-05-29 NOTE — Patient Instructions (Signed)
Kitchen counter stretch:  Middle,  Hands to R, hand to L  3 breaths to lengthe back     Hamstring stretch straight and knee point towards armpit      WALKING WITH RESISTANCE BLUE Band at waist connected to doorknob Stepping forward normal length steps, planting mid and forefoot down, center of mass ( navel) leans forward slightly as if you were walking uphill 3-4 steps till band feels taut ( MAKE SURE THE DOOR IS LOCKED AND WON'T OPEN)   Stepping backwards, lower heel slowly, carry trunk and hips back as you step

## 2018-05-30 NOTE — Therapy (Signed)
[place] MAIN [facility] SERVICES [address] [place], [place], [address] Phone: [phone]   Fax:  [phone]  Physical Therapy Treatment  Patient Details  Name: Carolyn Gregory MRN: [id number] Date of Birth: [birth year] Referring Provider (PT): Timmie Foerster    Encounter Date: 05/29/2018  PT End of Session - 05/29/18 1316    Visit Number  21    Authorization Type  recert submitted to Dr. Forbes Cellar  05/23/18:   1/15 authorized visits     PT Start Time  1207    PT Stop Time  1315    PT Time Calculation (min)  68 min    Activity Tolerance  Patient tolerated treatment well;No increased pain    Behavior During Therapy  WFL for tasks assessed/performed       Past Medical History:  Diagnosis Date  . Anxiety   . Depression   . Hypertension     Past Surgical History:  Procedure Laterality Date  . CHOLECYSTECTOMY    . MOHS SURGERY     to remove benign growth  located in the L low abdomen  . R ankle surgery   2016   to repair lateral tendon   . VAGINAL HYSTERECTOMY      There were no vitals filed for this visit.  Subjective Assessment - 05/29/18 1212    Subjective  Pt reported her B knee pain stayed at 0/10 after the session for 4 hours. Pt did not do extra walking. The pain went to a 4/10.  On the following day on 11/15, pt sought physical therapy at [facility] her knees and tendinitis.  Pt used a machine that she never been on before where she pushed the foot pedals with moving arm bar.   Following that PT session at [facility], pt noticed discomfort in her low back, knees, and shoulder. Over those 3 days, she was still feeling discomfort. On 05/26/18 at her second PT visit at [facility], pt went on the same machine for "warm up". Pt requested to do the machine at the lightest tension because she told them that the machine was causing discomfort in her back, legs, arms, shoulders. Even at the lightest restistance, pt continued feel  discomfort. Pt decided to not return the [facility] clinic because forced exercises are causing a set back in multiple areas where she had made previous progress with the PT sessions at [facility].  Pt stated she will continue to only receive PT at [facility],  Today, the L knee pain 7/10 on the inside. R knee pain 4-5/10.  Overall, "everything in the legs, ankles is getting better".  The area at the 2nd toe is still tight.      Pertinent History  Pt is interested in seeking psychotherapy/ psychiatry for managing a stress in her life regarding a family member. Pt was tearful at the beginning of session.           [facility] PT Assessment - 05/30/18 1104      Palpation   Palpation comment  increased tightness at medial attachments at tibial plateau of adductor and to medial portion of R,  hypomobility at R midfoot, interreous between rays I-V in BLE, extensor retinenculum on L, STM between digit II-III        Ambulation/Gait   Gait Comments  hyperextension on stance ( improved post Tx with excessive cues)                    [facility]  Adult PT Treatment/Exercise - 05/30/18 1128      Ambulation/Gait   Gait Comments  hyperextension on stance ( improved post Tx with excessive cues)       Manual Therapy   Manual therapy comments  STM with MWM at problem areas noted in assessment                   PT Long Term Goals - 01/08/18 1107      PT LONG TERM GOAL #1   Title  Pt will decrease her ODI score from 48% to < 38% in order to return to ADLs ( 10/30: 46%, 11/30: 38%)     Time  12    Period  Weeks    Status  Achieved      PT LONG TERM GOAL #2   Title  Pt will decrease her LEFS score from   25% to > 30%  in order to walk and have less pain at her R ankle.  (11/30: 26%, 4/26: 29%, 6/19: 35%  )     Time  12    Period  Weeks    Status  Achieved      PT LONG TERM GOAL #3   Title  Pt will demo less B ankle instability on uneven ground, improved gait in order to minimize risk for injuries  and falls     Time  12    Period  Weeks    Status  Revised      PT LONG TERM GOAL #4   Title  Pt will demo no pelvic obliquities and normal arthrokinematic motion of SIJ in order to minimize radiating pain to walk longer distances    Time  4    Period  Weeks    Status  Achieved      PT LONG TERM GOAL #5   Title  Pt will demo no pelvic floor mm tensions and proper ROM in order to eliminate urine and restore pelvic floor function    Time  12    Period  Weeks    Status  Achieved      PT LONG TERM GOAL #6   Title  Pt will demo decreased scar restrictions over abdomen, IND with abdominal massage, and report more regular bowel movements of 1-2 days in order to restore GI health    Time  12    Period  Weeks    Status  Achieved      PT LONG TERM GOAL #7   Title  Pt will demo no tensions/ tenderness at L sacrococcygeus ligament and coccygeus mm in order to sit for > 30 min and have no pain upon standing    Time  4    Period  Weeks    Status  Achieved      PT LONG TERM GOAL #8   Title  Pt will report no pain 100% of the time with crossing L ankle over R thigh and then getting up from chair  across 1 week in order to don shoes.     Time  4    Period  Weeks    Status  Achieved      PT LONG TERM GOAL  #9   TITLE  Pt will demo no lowered position of bladder and demo Grade 3/3/3/3 in order to improve pelvic girdle and back stability to increase physical activities without less risk for relapse    Time  8    Period  Weeks  Status  Partially Met      PT LONG TERM GOAL  #10   TITLE  Pt will increase plantarflexion reps from 10 reps B to > 20 reps B in order to climb stairs and progress towards balance training on uneven ground     Time  12    Period  Weeks    Status  New      PT LONG TERM GOAL  #11   TITLE  Pt will walk on uneven ground, moderate hills on a local nature trail for 1 mile for minimal pain < 2 /10 and no ankle sprain in order to enjoy her hobby     Time  12    Period   Weeks    Status  New      PT LONG TERM GOAL  #12   TITLE  Pt will be able to fast walking ( levelled ground) for 45 min and compliance to stretching afterwards in order to  minimize risk for prolapse .      Time  8    Period  Weeks    Status  New      PT LONG TERM GOAL  #13   TITLE  Pt will be able to climb stairs with proper technique , step-through pattern with one hand on rail up/ down across one flight in order to navigate community environments     Time  8    Period  Weeks    Status  New      PT LONG TERM GOAL  #14   TITLE  Pt will report no calf pain with stairs up and down or  after walking at a store for 15 min  in order to  progress towards dynamic balance exercises     Time  4    Period  Weeks    Status  New      PT LONG TERM GOAL  #15   TITLE  Pt will demo proper alignment and modifications with yoga to integrate into commuinity yoga classess with less risk for relapse of Sx     Time  12    Period  Weeks    Status  New            Plan - 05/30/18 1109    Clinical Impression Statement  Pt had pain relief from her BLE for 4 horus after last session. Pt attended 2 PT sessions at another PT clinic and performed exercises on a resistance machine and had increased pain in her knees, legs, arms, and back. Pt has voiced she will discontinue attending PT sessions at this clinic and only attend PT here because she feels she experienced a set back at the other PT clinic. Today, pt demo'd decreased mm tightness along lower kinetic chain following manual Tx and further demo'd improved propiocetion and neuromuscular control to not hyperextend knees in stance phase. Added training to increase co-activation of lower kinetic chain and deep core with resistance band walking exericse. Pt reported decreased pain after the Tx. Pt is starting to demo less adduction of B patella, genu valgus, increased foot/akle mobility with manual Tx across the past visits. Pt continues to benefit from  skilled PT.       Rehab Potential  Good    PT Frequency  1x / week    PT Duration  12 weeks    PT Treatment/Interventions  Functional mobility training;Stair training;Gait training;Patient/family education;Aquatic Therapy;Therapeutic activities;Manual lymph drainage;Moist Heat;Neuromuscular re-education;Therapeutic exercise;Manual techniques;Taping;Energy conservation  Consulted and Agree with Plan of Care  Patient       Patient will benefit from skilled therapeutic intervention in order to improve the following deficits and impairments:  Abnormal gait, Pain, Increased fascial restricitons, Decreased strength, Decreased scar mobility, Increased muscle spasms, Difficulty walking, Decreased mobility, Decreased range of motion, Decreased balance, Decreased safety awareness, Decreased coordination, Postural dysfunction, Improper body mechanics, Decreased endurance  Visit Diagnosis: Pain in right ankle and joints of right foot  Myalgia  Pain in left ankle and joints of left foot  Chronic 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   [name] ,PT, DPT, E-RYT  05/30/2018, 11:29 AM  [place] MAIN [facility] SERVICES [address] [place], [place], [address] Phone: [phone]   Fax:  [phone]  Name: Carolyn Gregory MRN: [id number] Date of Birth: 06/12/1961

## 2018-06-02 ENCOUNTER — Encounter: Payer: Non-veteran care | Admitting: Physical Therapy

## 2018-06-04 ENCOUNTER — Ambulatory Visit: Payer: No Typology Code available for payment source | Admitting: Physical Therapy

## 2018-06-04 DIAGNOSIS — G8929 Other chronic pain: Secondary | ICD-10-CM

## 2018-06-04 DIAGNOSIS — M25571 Pain in right ankle and joints of right foot: Secondary | ICD-10-CM

## 2018-06-04 DIAGNOSIS — M791 Myalgia, unspecified site: Secondary | ICD-10-CM | POA: Diagnosis not present

## 2018-06-04 DIAGNOSIS — M5442 Lumbago with sciatica, left side: Secondary | ICD-10-CM

## 2018-06-04 DIAGNOSIS — M25572 Pain in left ankle and joints of left foot: Secondary | ICD-10-CM

## 2018-06-04 NOTE — Patient Instructions (Signed)
Withhold from band resistance band exericse   Continue with hooklying feet slides with wider ballmound, toes lifted

## 2018-06-04 NOTE — Therapy (Signed)
[place] MAIN [facility] SERVICES [address] [place], [place], [address] Phone: [phone]   Fax:  [phone]  Physical Therapy Treatment  Patient Details  Name: Carolyn Gregory MRN: [id number] Date of Birth: [birth year] Referring Provider (PT): Timmie Foerster    Encounter Date: 06/04/2018  PT End of Session - 06/04/18 2121    Visit Number  22    Authorization Type  recert submitted to Dr. Forbes Cellar  05/23/18:   2/15 authorized visits     PT Start Time  1110    PT Stop Time  1200    PT Time Calculation (min)  50 min    Activity Tolerance  Patient tolerated treatment well;No increased pain    Behavior During Therapy  WFL for tasks assessed/performed       Past Medical History:  Diagnosis Date  . Anxiety   . Depression   . Hypertension     Past Surgical History:  Procedure Laterality Date  . CHOLECYSTECTOMY    . MOHS SURGERY     to remove benign growth  located in the L low abdomen  . R ankle surgery   2016   to repair lateral tendon   . VAGINAL HYSTERECTOMY      There were no vitals filed for this visit.  Subjective Assessment - 06/04/18 1110    Subjective  Pt reported the resistance band exercises caused more knee pain and she did not continue. There is medial knee pain on B ( L >R). The top of her L foot has increased pain sine the exericse.      Pertinent History  Pt is interested in seeking psychotherapy/ psychiatry for managing a stress in her life regarding a family member. Pt was tearful at the beginning of session.           [facility] PT Assessment - 06/04/18 2121      Observation/Other Assessments   Observations  L SLR ~130 deg, R ~120 deg       Palpation   Palpation comment  tight dorsal interreous mm, extensor retinaculum on L, tendenress at medial tibial plateau L         Bed Mobility   Bed Mobility  --   crunch method      Ambulation/Gait   Gait Comments  wider BOS, less adduction of B patella, less B genu  valgus                    [facility] Adult PT Treatment/Exercise - 06/04/18 2121      Neuro Re-ed    Neuro Re-ed Details   see pt instructions       Moist Heat Therapy   Number Minutes Moist Heat  5 Minutes    Moist Heat Location  Knee;Other (comment)   foot/ankle      Manual Therapy   Manual therapy comments  distraction at subtalar joint, STM along probelm areas, medial glide at tibia/ lateral glide of femur on L                   PT Long Term Goals - 01/08/18 1107      PT LONG TERM GOAL #1   Title  Pt will decrease her ODI score from 48% to < 38% in order to return to ADLs ( 10/30: 46%, 11/30: 38%)     Time  12    Period  Weeks    Status  Achieved  PT LONG TERM GOAL #2   Title  Pt will decrease her LEFS score from   25% to > 30%  in order to walk and have less pain at her R ankle.  (11/30: 26%, 4/26: 29%, 6/19: 35%  )     Time  12    Period  Weeks    Status  Achieved      PT LONG TERM GOAL #3   Title  Pt will demo less B ankle instability on uneven ground, improved gait in order to minimize risk for injuries and falls     Time  12    Period  Weeks    Status  Revised      PT LONG TERM GOAL #4   Title  Pt will demo no pelvic obliquities and normal arthrokinematic motion of SIJ in order to minimize radiating pain to walk longer distances    Time  4    Period  Weeks    Status  Achieved      PT LONG TERM GOAL #5   Title  Pt will demo no pelvic floor mm tensions and proper ROM in order to eliminate urine and restore pelvic floor function    Time  12    Period  Weeks    Status  Achieved      PT LONG TERM GOAL #6   Title  Pt will demo decreased scar restrictions over abdomen, IND with abdominal massage, and report more regular bowel movements of 1-2 days in order to restore GI health    Time  12    Period  Weeks    Status  Achieved      PT LONG TERM GOAL #7   Title  Pt will demo no tensions/ tenderness at L sacrococcygeus ligament and  coccygeus mm in order to sit for > 30 min and have no pain upon standing    Time  4    Period  Weeks    Status  Achieved      PT LONG TERM GOAL #8   Title  Pt will report no pain 100% of the time with crossing L ankle over R thigh and then getting up from chair  across 1 week in order to don shoes.     Time  4    Period  Weeks    Status  Achieved      PT LONG TERM GOAL  #9   TITLE  Pt will demo no lowered position of bladder and demo Grade 3/3/3/3 in order to improve pelvic girdle and back stability to increase physical activities without less risk for relapse    Time  8    Period  Weeks    Status  Partially Met      PT LONG TERM GOAL  #10   TITLE  Pt will increase plantarflexion reps from 10 reps B to > 20 reps B in order to climb stairs and progress towards balance training on uneven ground     Time  12    Period  Weeks    Status  New      PT LONG TERM GOAL  #11   TITLE  Pt will walk on uneven ground, moderate hills on a local nature trail for 1 mile for minimal pain < 2 /10 and no ankle sprain in order to enjoy her hobby     Time  12    Period  Weeks    Status  New  PT LONG TERM GOAL  #12   TITLE  Pt will be able to fast walking ( levelled ground) for 45 min and compliance to stretching afterwards in order to  minimize risk for prolapse .      Time  8    Period  Weeks    Status  New      PT LONG TERM GOAL  #13   TITLE  Pt will be able to climb stairs with proper technique , step-through pattern with one hand on rail up/ down across one flight in order to navigate community environments     Time  8    Period  Weeks    Status  New      PT LONG TERM GOAL  #14   TITLE  Pt will report no calf pain with stairs up and down or  after walking at a store for 15 min  in order to  progress towards dynamic balance exercises     Time  4    Period  Weeks    Status  New      PT LONG TERM GOAL  #15   TITLE  Pt will demo proper alignment and modifications with yoga to integrate  into commuinity yoga classess with less risk for relapse of Sx     Time  12    Period  Weeks    Status  New            Plan - 06/04/18 2129    Clinical Impression Statement  Withholding resistance band exercise has it caused more pain. Following Tx today, pt reported no pain at the dorsum of L foot as she demo'd signficantly more abduction of rays and wider transverse arch co-activation.  Knees are also less in genu valgus in gait. Pt learned stabilization principles to improve propioception of B knees, ankle , and foot.  Pt continues to benefit from skilled PT.     Rehab Potential  Good    PT Frequency  1x / week    PT Duration  12 weeks    PT Treatment/Interventions  Functional mobility training;Stair training;Gait training;Patient/family education;Aquatic Therapy;Therapeutic activities;Manual lymph drainage;Moist Heat;Neuromuscular re-education;Therapeutic exercise;Manual techniques;Taping;Energy conservation    Consulted and Agree with Plan of Care  Patient       Patient will benefit from skilled therapeutic intervention in order to improve the following deficits and impairments:  Abnormal gait, Pain, Increased fascial restricitons, Decreased strength, Decreased scar mobility, Increased muscle spasms, Difficulty walking, Decreased mobility, Decreased range of motion, Decreased balance, Decreased safety awareness, Decreased coordination, Postural dysfunction, Improper body mechanics, Decreased endurance  Visit Diagnosis: Myalgia  Pain in right ankle and joints of right foot  Pain in left ankle and joints of left foot  Chronic 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   [name] ,PT, DPT, E-RYT  06/04/2018, 9:31 PM  [place] MAIN [facility] SERVICES [address] [place], [place], [address] Phone: [phone]   Fax:  [phone]  Name:  Carolyn Gregory MRN: [id number] Date of Birth: 06/09/1961

## 2018-06-09 ENCOUNTER — Ambulatory Visit: Payer: No Typology Code available for payment source | Attending: Internal Medicine | Admitting: Physical Therapy

## 2018-06-09 DIAGNOSIS — M25572 Pain in left ankle and joints of left foot: Secondary | ICD-10-CM | POA: Diagnosis present

## 2018-06-09 DIAGNOSIS — G8929 Other chronic pain: Secondary | ICD-10-CM | POA: Diagnosis present

## 2018-06-09 DIAGNOSIS — M791 Myalgia, unspecified site: Secondary | ICD-10-CM | POA: Insufficient documentation

## 2018-06-09 DIAGNOSIS — M25571 Pain in right ankle and joints of right foot: Secondary | ICD-10-CM | POA: Insufficient documentation

## 2018-06-09 DIAGNOSIS — M5442 Lumbago with sciatica, left side: Secondary | ICD-10-CM | POA: Diagnosis present

## 2018-06-09 NOTE — Therapy (Signed)
[place] MAIN [facility] SERVICES [address] [place], [place], [address] Phone: [phone]   Fax:  [phone]  Physical Therapy Treatment  Patient Details  Name: Carolyn Gregory MRN: [id number] Date of Birth: 11/03/1960 Referring Provider (PT): Timmie Foerster    Encounter Date: 06/09/2018  PT End of Session - 06/09/18 1202    Visit Number  23    Authorization Type  recert submitted to Dr. Forbes Cellar  05/23/18:   3/15 authorized visits     PT Start Time  1115    PT Stop Time  1205    PT Time Calculation (min)  50 min    Activity Tolerance  Patient tolerated treatment well;No increased pain    Behavior During Therapy  WFL for tasks assessed/performed       Past Medical History:  Diagnosis Date  . Anxiety   . Depression   . Hypertension     Past Surgical History:  Procedure Laterality Date  . CHOLECYSTECTOMY    . MOHS SURGERY     to remove benign growth  located in the L low abdomen  . R ankle surgery   2016   to repair lateral tendon   . VAGINAL HYSTERECTOMY      There were no vitals filed for this visit.  Subjective Assessment - 06/09/18 1113    Subjective  Pt reported the pain relief in B knees did not last long. It gradually came back when she arrived home after several hours.  Pt noticed R hip pain when stepping on R foot on Sat, Sun, and this morning,  but that is gone now today. The numbness in the R back of the thigh presents itself from time to time. There is tenderness to the medial knee on BLE when placing a pillow between knees.      Pertinent History  Pt is interested in seeking psychotherapy/ psychiatry for managing a stress in her life regarding a family member. Pt was tearful at the beginning of session.           [facility] PT Assessment - 06/09/18 1158      AROM   Overall AROM Comments  L knee flexion 90 deg w/ tenderness/pain at medial aspect of knee ( pre Tx), no pain ( post Tx)       Palpation   Palpation  comment  tightness along attachment of vastus medialis L,  fascial restriction over medial aspect of patella    tightness semimembranosus L, tightness along 2-3rd ray of L                   [facility] Adult PT Treatment/Exercise - 06/09/18 1201      Moist Heat Therapy   Number Minutes Moist Heat  10 Minutes    Moist Heat Location  --   foot and knee L with guided imagery      Manual Therapy   Manual therapy comments  medial glide of tibia, lateral glide of femur L, STM/MWM at problem areas of Lknee ,  STM at L 2-3rd fascial release                   PT Long Term Goals - 01/08/18 1107      PT LONG TERM GOAL #1   Title  Pt will decrease her ODI score from 48% to < 38% in order to return to ADLs ( 10/30: 46%, 11/30: 38%)     Time  12  Period  Weeks    Status  Achieved      PT LONG TERM GOAL #2   Title  Pt will decrease her LEFS score from   25% to > 30%  in order to walk and have less pain at her R ankle.  (11/30: 26%, 4/26: 29%, 6/19: 35%  )     Time  12    Period  Weeks    Status  Achieved      PT LONG TERM GOAL #3   Title  Pt will demo less B ankle instability on uneven ground, improved gait in order to minimize risk for injuries and falls     Time  12    Period  Weeks    Status  Revised      PT LONG TERM GOAL #4   Title  Pt will demo no pelvic obliquities and normal arthrokinematic motion of SIJ in order to minimize radiating pain to walk longer distances    Time  4    Period  Weeks    Status  Achieved      PT LONG TERM GOAL #5   Title  Pt will demo no pelvic floor mm tensions and proper ROM in order to eliminate urine and restore pelvic floor function    Time  12    Period  Weeks    Status  Achieved      PT LONG TERM GOAL #6   Title  Pt will demo decreased scar restrictions over abdomen, IND with abdominal massage, and report more regular bowel movements of 1-2 days in order to restore GI health    Time  12    Period  Weeks    Status  Achieved       PT LONG TERM GOAL #7   Title  Pt will demo no tensions/ tenderness at L sacrococcygeus ligament and coccygeus mm in order to sit for > 30 min and have no pain upon standing    Time  4    Period  Weeks    Status  Achieved      PT LONG TERM GOAL #8   Title  Pt will report no pain 100% of the time with crossing L ankle over R thigh and then getting up from chair  across 1 week in order to don shoes.     Time  4    Period  Weeks    Status  Achieved      PT LONG TERM GOAL  #9   TITLE  Pt will demo no lowered position of bladder and demo Grade 3/3/3/3 in order to improve pelvic girdle and back stability to increase physical activities without less risk for relapse    Time  8    Period  Weeks    Status  Partially Met      PT LONG TERM GOAL  #10   TITLE  Pt will increase plantarflexion reps from 10 reps B to > 20 reps B in order to climb stairs and progress towards balance training on uneven ground     Time  12    Period  Weeks    Status  New      PT LONG TERM GOAL  #11   TITLE  Pt will walk on uneven ground, moderate hills on a local nature trail for 1 mile for minimal pain < 2 /10 and no ankle sprain in order to enjoy her hobby     Time  12  Period  Weeks    Status  New      PT LONG TERM GOAL  #12   TITLE  Pt will be able to fast walking ( levelled ground) for 45 min and compliance to stretching afterwards in order to  minimize risk for prolapse .      Time  8    Period  Weeks    Status  New      PT LONG TERM GOAL  #13   TITLE  Pt will be able to climb stairs with proper technique , step-through pattern with one hand on rail up/ down across one flight in order to navigate community environments     Time  8    Period  Weeks    Status  New      PT LONG TERM GOAL  #14   TITLE  Pt will report no calf pain with stairs up and down or  after walking at a store for 15 min  in order to  progress towards dynamic balance exercises     Time  4    Period  Weeks    Status  New       PT LONG TERM GOAL  #15   TITLE  Pt will demo proper alignment and modifications with yoga to integrate into commuinity yoga classess with less risk for relapse of Sx     Time  12    Period  Weeks    Status  New            Plan - 06/09/18 1202    Clinical Impression Statement  Pt demo'd tightness and tenderness at medial aspect of L knee after manual Tx today and has good carry over with decreased tightness over the dorsum of L foot from last session. Added guided imagery to help with pain science reducation. Pt 's gait is improving with less genu valgus and more weightbearing through transverse acrch. Pt continues to benefit from skilled PT.      Rehab Potential  Good    PT Frequency  1x / week    PT Duration  12 weeks    PT Treatment/Interventions  Functional mobility training;Stair training;Gait training;Patient/family education;Aquatic Therapy;Therapeutic activities;Manual lymph drainage;Moist Heat;Neuromuscular re-education;Therapeutic exercise;Manual techniques;Taping;Energy conservation    Consulted and Agree with Plan of Care  Patient       Patient will benefit from skilled therapeutic intervention in order to improve the following deficits and impairments:  Abnormal gait, Pain, Increased fascial restricitons, Decreased strength, Decreased scar mobility, Increased muscle spasms, Difficulty walking, Decreased mobility, Decreased range of motion, Decreased balance, Decreased safety awareness, Decreased coordination, Postural dysfunction, Improper body mechanics, Decreased endurance  Visit Diagnosis: Myalgia  Pain in right ankle and joints of right foot  Pain in left ankle and joints of left foot  Chronic 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   [name] ,PT, DPT, E-RYT  06/09/2018, 12:09 PM  [place] MAIN [facility] SERVICES [address] [place], [place], [address] Phone: [phone]   Fax:  [phone]  Name: Carolyn Gregory MRN: [id number] Date of Birth: 1960-11-17

## 2018-06-17 ENCOUNTER — Ambulatory Visit: Payer: No Typology Code available for payment source | Admitting: Physical Therapy

## 2018-06-17 DIAGNOSIS — M25572 Pain in left ankle and joints of left foot: Secondary | ICD-10-CM

## 2018-06-17 DIAGNOSIS — M5442 Lumbago with sciatica, left side: Secondary | ICD-10-CM

## 2018-06-17 DIAGNOSIS — G8929 Other chronic pain: Secondary | ICD-10-CM

## 2018-06-17 DIAGNOSIS — M791 Myalgia, unspecified site: Secondary | ICD-10-CM | POA: Diagnosis not present

## 2018-06-17 DIAGNOSIS — M25571 Pain in right ankle and joints of right foot: Secondary | ICD-10-CM

## 2018-06-17 NOTE — Therapy (Signed)
[place] MAIN [facility] SERVICES [address] [place], [place], [address] Phone: [phone]   Fax:  [phone]  Physical Therapy Treatment  Patient Details  Name: Carolyn Gregory MRN: [id number] Date of Birth: 1960/11/02 Referring Provider (PT): Timmie Foerster    Encounter Date: 06/17/2018  PT End of Session - 06/17/18 1232    Visit Number  24    Authorization Type  recert submitted to Dr. Forbes Cellar  05/23/18:   4/15 authorized visits     PT Start Time  1105    PT Stop Time  1230    PT Time Calculation (min)  85 min    Activity Tolerance  Patient tolerated treatment well;No increased pain    Behavior During Therapy  WFL for tasks assessed/performed       Past Medical History:  Diagnosis Date  . Anxiety   . Depression   . Hypertension     Past Surgical History:  Procedure Laterality Date  . CHOLECYSTECTOMY    . MOHS SURGERY     to remove benign growth  located in the L low abdomen  . R ankle surgery   2016   to repair lateral tendon   . VAGINAL HYSTERECTOMY      There were no vitals filed for this visit.  Subjective Assessment - 06/17/18 1109    Subjective  Pt reports no change since last session. Pt visited her pain doctor who also was saying the same thing that pt had tightness which contributes to knee pain. The knee is in good condition. Shooting pain on R posterior mid thigh occurs less across the week compared to previously. It eases quickly.  The buckling of knee is not occuring as frequent.  The pain at the 2nd toe occurs not as often but there is pain 8/10 . It did not occur with walking from her car to clinic compared past sessions.           [facility] PT Assessment - 06/17/18 1230      Observation/Other Assessments   Observations  pt required support under arms and pillow under shoulders for more comfort 2/2 chronic shoulder issues       Palpation   Palpation comment  tightness along adductor hallucis B, fibularis  longus B , L DAB between digit I-II rays                   [facility] Adult PT Treatment/Exercise - 06/17/18 1232      Therapeutic Activites    Therapeutic Activities  --   biopsychosocial approaches to pain, motionvational interview     Moist Heat Therapy   Number Minutes Moist Heat  5 Minutes    Moist Heat Location  Ankle;Other (comment)   feet     Manual Therapy   Manual therapy comments  STM with MWM at problem areas noted in assessment                   PT Long Term Goals - 01/08/18 1107      PT LONG TERM GOAL #1   Title  Pt will decrease her ODI score from 48% to < 38% in order to return to ADLs ( 10/30: 46%, 11/30: 38%)     Time  12    Period  Weeks    Status  Achieved      PT LONG TERM GOAL #2   Title  Pt will decrease her LEFS score from   25%  to > 30%  in order to walk and have less pain at her R ankle.  (11/30: 26%, 4/26: 29%, 6/19: 35%  )     Time  12    Period  Weeks    Status  Achieved      PT LONG TERM GOAL #3   Title  Pt will demo less B ankle instability on uneven ground, improved gait in order to minimize risk for injuries and falls     Time  12    Period  Weeks    Status  Revised      PT LONG TERM GOAL #4   Title  Pt will demo no pelvic obliquities and normal arthrokinematic motion of SIJ in order to minimize radiating pain to walk longer distances    Time  4    Period  Weeks    Status  Achieved      PT LONG TERM GOAL #5   Title  Pt will demo no pelvic floor mm tensions and proper ROM in order to eliminate urine and restore pelvic floor function    Time  12    Period  Weeks    Status  Achieved      PT LONG TERM GOAL #6   Title  Pt will demo decreased scar restrictions over abdomen, IND with abdominal massage, and report more regular bowel movements of 1-2 days in order to restore GI health    Time  12    Period  Weeks    Status  Achieved      PT LONG TERM GOAL #7   Title  Pt will demo no tensions/ tenderness at L  sacrococcygeus ligament and coccygeus mm in order to sit for > 30 min and have no pain upon standing    Time  4    Period  Weeks    Status  Achieved      PT LONG TERM GOAL #8   Title  Pt will report no pain 100% of the time with crossing L ankle over R thigh and then getting up from chair  across 1 week in order to don shoes.     Time  4    Period  Weeks    Status  Achieved      PT LONG TERM GOAL  #9   TITLE  Pt will demo no lowered position of bladder and demo Grade 3/3/3/3 in order to improve pelvic girdle and back stability to increase physical activities without less risk for relapse    Time  8    Period  Weeks    Status  Partially Met      PT LONG TERM GOAL  #10   TITLE  Pt will increase plantarflexion reps from 10 reps B to > 20 reps B in order to climb stairs and progress towards balance training on uneven ground     Time  12    Period  Weeks    Status  New      PT LONG TERM GOAL  #11   TITLE  Pt will walk on uneven ground, moderate hills on a local nature trail for 1 mile for minimal pain < 2 /10 and no ankle sprain in order to enjoy her hobby     Time  12    Period  Weeks    Status  New      PT LONG TERM GOAL  #12   TITLE  Pt will be able to fast  walking ( levelled ground) for 45 min and compliance to stretching afterwards in order to  minimize risk for prolapse .      Time  8    Period  Weeks    Status  New      PT LONG TERM GOAL  #13   TITLE  Pt will be able to climb stairs with proper technique , step-through pattern with one hand on rail up/ down across one flight in order to navigate community environments     Time  8    Period  Weeks    Status  New      PT LONG TERM GOAL  #14   TITLE  Pt will report no calf pain with stairs up and down or  after walking at a store for 15 min  in order to  progress towards dynamic balance exercises     Time  4    Period  Weeks    Status  New      PT LONG TERM GOAL  #15   TITLE  Pt will demo proper alignment and  modifications with yoga to integrate into commuinity yoga classess with less risk for relapse of Sx     Time  12    Period  Weeks    Status  New            Plan - 06/17/18 1233    Clinical Impression Statement  Pt demo'd improved tranverse arch co-activation with more abductionof patella in gait. Pt required more manual Tx to release tightness at fibularis longus, adductor hallucis, and intereosus B which pt tolerated without increased pain. Provided biopsychosocial approaches for pain.  Pt continues to benefit from skilled PT.      Rehab Potential  Good    PT Frequency  1x / week    PT Duration  12 weeks    PT Treatment/Interventions  Functional mobility training;Stair training;Gait training;Patient/family education;Aquatic Therapy;Therapeutic activities;Manual lymph drainage;Moist Heat;Neuromuscular re-education;Therapeutic exercise;Manual techniques;Taping;Energy conservation    Consulted and Agree with Plan of Care  Patient       Patient will benefit from skilled therapeutic intervention in order to improve the following deficits and impairments:  Abnormal gait, Pain, Increased fascial restricitons, Decreased strength, Decreased scar mobility, Increased muscle spasms, Difficulty walking, Decreased mobility, Decreased range of motion, Decreased balance, Decreased safety awareness, Decreased coordination, Postural dysfunction, Improper body mechanics, Decreased endurance  Visit Diagnosis: Pain in left ankle and joints of left foot  Pain in right ankle and joints of right foot  Chronic 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   [name] ,PT, DPT, E-RYT  06/17/2018, 12:37 PM  [place] MAIN [facility] SERVICES [address] [place], [place], [address] Phone: [phone]   Fax:  [phone]  Name: Carolyn Gregory MRN: [id number] Date of Birth:  08/02/1960

## 2018-06-23 ENCOUNTER — Ambulatory Visit: Payer: No Typology Code available for payment source | Admitting: Physical Therapy

## 2018-06-23 DIAGNOSIS — G8929 Other chronic pain: Secondary | ICD-10-CM

## 2018-06-23 DIAGNOSIS — M25572 Pain in left ankle and joints of left foot: Secondary | ICD-10-CM

## 2018-06-23 DIAGNOSIS — M5442 Lumbago with sciatica, left side: Secondary | ICD-10-CM

## 2018-06-23 DIAGNOSIS — M791 Myalgia, unspecified site: Secondary | ICD-10-CM | POA: Diagnosis not present

## 2018-06-23 DIAGNOSIS — M25571 Pain in right ankle and joints of right foot: Secondary | ICD-10-CM

## 2018-06-23 NOTE — Patient Instructions (Signed)
To help with emotional / psychosocial issues  [facility] for a [facility] of [facility]otherapist/ psychiatrists in the community  [facility]   To help minimize return of tightness: Activity pacing to stretch every 30 -45 min of walking or household activities (seated or standing stretches)    Loose shoe laces to promote wide spreading of toes    * KEEP UP [facility] and increased walking with rest and stretches

## 2018-06-23 NOTE — Therapy (Signed)
[facility] MAIN [facility] SERVICES [address] [place], [place], [address] Phone: [phone]   Fax:  [phone]  Physical Therapy Treatment  Patient Details  Name: Carolyn Gregory MRN: [id number] Date of Birth: 1960/12/29 Referring Provider (PT): Berton Mount    Encounter Date: 06/23/2018  PT End of Session - 06/23/18 1222    Visit Number  25    Authorization Type  recert submitted to Dr. Alfonso Patten  05/23/18:   5/15 authorized visits     PT Start Time  1123    PT Stop Time  1222    PT Time Calculation (min)  59 min    Activity Tolerance  Patient tolerated treatment well;No increased pain    Behavior During Therapy  WFL for tasks assessed/performed       Past Medical History:  Diagnosis Date  . Anxiety   . Depression   . Hypertension     Past Surgical History:  Procedure Laterality Date  . CHOLECYSTECTOMY    . MOHS SURGERY     to remove benign growth  located in the L low abdomen  . R ankle surgery   2016   to repair lateral tendon   . VAGINAL HYSTERECTOMY      There were no vitals filed for this visit.  Subjective Assessment - 06/23/18 1213    Subjective  Pt reported she noticed she [facility] 4,[facility]. Pt had minor knee pain on this day ( shooting pain which did not last long).  The following morning, pt was awakened by calf muscle pain. 7/10.  It eased after several minutes. L toe pain occurred with walking the next day when she walked 2393 steps and pt was able to relieve it with the foot massage and toes spacers.  The L ankle inner pain occured yesterday. This morning, pt felt energy to remove items from her freeze which was a task she has been wanting to complete for at least a month.            [facility] PT Assessment - 06/23/18 1224      Palpation   Palpation comment  tightness along adductor hallucis B, intereosus between rays I-IV , achilles  B       Ambulation/Gait   Gait Comments   abduction of patella, hip ER/abd, proper swing/ no hyperextension of knees B     Gait speed post Tx: 1.12 m/s               [facility] Adult PT Treatment/Exercise - 06/23/18 1224      Therapeutic Activites    Therapeutic Activities  --   motionvational interviewing, active listening, biopsychosoci     Neuro Re-ed    Neuro Re-ed Details   see pt instructions       Moist Heat Therapy   Moist Heat Location  Ankle;Other (comment)   feet      Manual Therapy   Manual therapy comments  STM with MWM at problem areas noted in assessment                   PT Long Term Goals - 06/23/18 1429      PT LONG TERM GOAL #1   Title  Pt will decrease her ODI score from 48% to < 38% in order to return to ADLs ( 10/30: 46%, 11/30: 13%)     Time  12    Period  Weeks  Status  Achieved      PT LONG TERM GOAL #2   Title  Pt will decrease her LEFS score from   25% to > 30%  in order to walk and have less pain at her R ankle.  (11/30: 26%, 4/26: 29%, 6/19: 35%  )     Time  12    Period  Weeks    Status  Achieved      PT LONG TERM GOAL #3   Title  Pt will demo less B ankle instability on uneven ground, improved gait in order to minimize risk for injuries and falls     Time  12    Period  Weeks    Status  Revised      PT LONG TERM GOAL #4   Title  Pt will demo no pelvic obliquities and normal arthrokinematic motion of SIJ in order to minimize radiating pain to walk longer distances    Time  4    Period  Weeks    Status  Achieved      PT LONG TERM GOAL #5   Title  Pt will demo no pelvic floor mm tensions and proper ROM in order to eliminate urine and restore pelvic floor function    Time  12    Period  Weeks    Status  Achieved      PT LONG TERM GOAL #6   Title  Pt will demo decreased scar restrictions over abdomen, IND with abdominal massage, and report more regular bowel movements of 1-2 days in order to restore GI health    Time  12    Period  Weeks    Status   Achieved      PT LONG TERM GOAL #7   Title  Pt will demo no tensions/ tenderness at L sacrococcygeus ligament and coccygeus mm in order to sit for > 30 min and have no pain upon standing    Time  4    Period  Weeks    Status  Achieved      PT LONG TERM GOAL #8   Title  Pt will report no pain 100% of the time with crossing L ankle over R thigh and then getting up from chair  across 1 week in order to don shoes.     Time  4    Period  Weeks    Status  Achieved      PT LONG TERM GOAL  #9   TITLE  Pt will demo no lowered position of bladder and demo Grade 3/3/3/3 in order to improve pelvic girdle and back stability to increase physical activities without less risk for relapse    Time  8    Period  Weeks    Status  On-going      PT LONG TERM GOAL  #10   TITLE  Pt will increase plantarflexion reps from 10 reps B to > 20 reps B in order to climb stairs and progress towards balance training on uneven ground     Time  12    Period  Weeks    Status  On-going      PT LONG TERM GOAL  #11   TITLE  Pt will walk on uneven ground, moderate hills on a local nature trail for 1 mile for minimal pain < 2 /10 and no ankle sprain in order to enjoy her hobby     Time  12    Period  Weeks  Status  On-going      PT LONG TERM GOAL  #12   TITLE  Pt will be able to fast walking ( levelled ground) for 45 min and compliance to stretching afterwards in order to  minimize risk for prolapse .      Time  8    Period  Weeks    Status  On-going      PT LONG TERM GOAL  #13   TITLE  Pt will be able to climb stairs with proper technique , step-through pattern with one hand on rail up/ down across one flight in order to navigate community environments     Time  8    Period  Weeks    Status  On-going      PT LONG TERM GOAL  #14   TITLE  Pt will report no calf pain with stairs up and down or  after walking at a store for 15 min  in order to  progress towards dynamic balance exercises     Time  4    Period   Weeks    Status  On-going      PT LONG TERM GOAL  #15   TITLE  Pt will demo proper alignment and modifications with yoga to integrate into commuinity yoga classess with less risk for relapse of Sx     Time  12    Period  Weeks    Status  On-going            Plan - 06/23/18 1223    Clinical Impression Statement  Pt is progressing well with decreased foot/ankle/ leg/ hip tightness, improved BLE/spine/hip ROM, improved gait mechanics: proper swing, no hyperextension of knees, proper mobility in feet, ankle, and more hip ER/abd with more abducted patella. Pt has no more genu valgus and has increased weight bearing across longitudinal and transverse arches.   Provided manual Tx to release more forefoot tightness and slightly hypomobile of midfoot joints which was likely related to pt's increased walking endurance  with more steps in the community. Pt's gait speed increased to 1.12 m/s  Pt was educated about activity pacing and stretches taht can be performed in public places on a seat or standing.  Continue to add more stretches that can be performed in against-gravity positions to maintain flexibility while pt progresses with more walking endurance. Plan to address stairs next session.   Pt was tearful today during session, expressing a need for psychotherapy or psychiatry. Pt expressed stressors. Pt would like to work with a doctor who is [place] [facility] as [facility] expressed having [facility] at the [facility]. Pt was referred to Dr. Shelda AltesKapur[facility] but this office does not take [employer] [facility]. [facility] was [facility] with [facility] to find out a list of providers in the [place].  Pt was also encouraged to utilize Healing Touch services offered [facility] at [facility].   Pt declined using Chaplain services.  Pt appeared with brighter affect. Pt continues to benefit from skilled PT.      Rehab Potential  Good    PT Frequency  1x / week    PT Duration  12 weeks    PT Treatment/Interventions   Functional mobility training;Stair training;Gait training;Patient/family education;Aquatic Therapy;Therapeutic activities;Manual lymph drainage;Moist Heat;Neuromuscular re-education;Therapeutic exercise;Manual techniques;Taping;Energy conservation    Consulted and Agree with Plan of Care  Patient       Patient will benefit from skilled therapeutic intervention in order to improve the  following deficits and impairments:  Abnormal gait, Pain, Increased fascial restricitons, Decreased strength, Decreased scar mobility, Increased muscle spasms, Difficulty walking, Decreased mobility, Decreased range of motion, Decreased balance, Decreased safety awareness, Decreased coordination, Postural dysfunction, Improper body mechanics, Decreased endurance  Visit Diagnosis: Pain in right ankle and joints of right foot  Pain in left ankle and joints of left foot  Chronic bilateral low back pain with left-sided sciatica  Myalgia     Problem List There are no active problems to display for this patient.   Mariane Masters ,PT, DPT, E-RYT  06/23/2018, 2:32 PM  [facility] MAIN [facility] SERVICES [address] [place], [place], [address] Phone: [phone]   Fax:  [phone]  Name: Carolyn Gregory MRN: [id number] Date of Birth: 05-Jul-1961

## 2018-06-30 ENCOUNTER — Ambulatory Visit: Payer: No Typology Code available for payment source | Admitting: Physical Therapy

## 2018-06-30 DIAGNOSIS — M5442 Lumbago with sciatica, left side: Secondary | ICD-10-CM

## 2018-06-30 DIAGNOSIS — M791 Myalgia, unspecified site: Secondary | ICD-10-CM | POA: Diagnosis not present

## 2018-06-30 DIAGNOSIS — G8929 Other chronic pain: Secondary | ICD-10-CM

## 2018-06-30 DIAGNOSIS — M25571 Pain in right ankle and joints of right foot: Secondary | ICD-10-CM

## 2018-06-30 DIAGNOSIS — M25572 Pain in left ankle and joints of left foot: Secondary | ICD-10-CM

## 2018-06-30 NOTE — Therapy (Signed)
[facility] [address] [place], [place], [address] Phone: [phone]   Fax:  [phone]  Physical Therapy Treatment  Patient Details  Name: Carolyn Gregory MRN: [id number] Date of Birth: June 24, 1961 Referring Provider (PT): Berton MountAthena Howard,MD    Encounter Date: 06/30/2018  PT End of Session - 06/30/18 1549    Visit Number  26    Authorization Type  recert submitted to Dr. Alfonso Patteneans  05/23/18:   6/15 authorized visits     PT Start Time  1104    PT Stop Time  1215    PT Time Calculation (min)  71 min    Activity Tolerance  Patient tolerated treatment well;No increased pain    Behavior During Therapy  WFL for tasks assessed/performed       Past Medical History:  Diagnosis Date  . Anxiety   . Depression   . Hypertension     Past Surgical History:  Procedure Laterality Date  . CHOLECYSTECTOMY    . MOHS SURGERY     to remove benign growth  located in the L low abdomen  . R ankle surgery   2016   to repair lateral tendon   . VAGINAL HYSTERECTOMY      There were no vitals filed for this visit.  Subjective Assessment - 06/30/18 1113    Subjective  P reported she felt better after last session which lasted 3-4 days. Today, pt states limitation in the R bak of the thigh has increased since feeling relieved from last session. Pt also feels pain at the same area on teh 2nd toe pad when she pushes off on it and although it occurs daily, it does not last all day long like it used to. pt also reports L hip pain.           [facility] Assessment - 06/30/18 1116      Palpation   SI assessment   L PSIS more anterior in standing    Palpation comment  tenderness/ hypomobility at L SIJ, coccygeal mm ,  L dorsum of foot between rays II-IV       Ambulation/Gait   Gait Comments  limited hip/knee flexion, poor control on swing pre Tx with report of L hip/ L foot pain                 Pelvic Floor Special Questions -  06/30/18 1158    Prolapse  --   bladder at level of pubic symphysis within introitus    Pelvic Floor Internal Exam  pt consented verbally without contraindication     Exam Type  Vaginal    Palpation  B tenderness/ tensions obt fascia/ ATLA,     Strength  fair squeeze, definite lift        [facility] Adult PT Treatment/Exercise - 06/30/18 1200      Neuro Re-ed    Neuro Re-ed Details   see pt instructions       Moist Heat Therapy   Number Minutes Moist Heat  5 Minutes    Moist Heat Location  --   dorsum of L foot . sacrum L      Manual Therapy   Manual therapy comments  STM/ MWM at problem areas noted in assessment with external and internal Tx                   PT Long Term Goals - 06/23/18 1429      PT LONG  TERM GOAL #1   Title  Pt will decrease her ODI score from 48% to < 38% in order to return to ADLs ( 10/30: 46%, 11/30: 16%)     Time  12    Period  Weeks    Status  Achieved      PT LONG TERM GOAL #2   Title  Pt will decrease her LEFS score from   25% to > 30%  in order to walk and have less pain at her R ankle.  (11/30: 26%, 4/26: 29%, 6/19: 35%  )     Time  12    Period  Weeks    Status  Achieved      PT LONG TERM GOAL #3   Title  Pt will demo less B ankle instability on uneven ground, improved gait in order to minimize risk for injuries and falls     Time  12    Period  Weeks    Status  Revised      PT LONG TERM GOAL #4   Title  Pt will demo no pelvic obliquities and normal arthrokinematic motion of SIJ in order to minimize radiating pain to walk longer distances    Time  4    Period  Weeks    Status  Achieved      PT LONG TERM GOAL #5   Title  Pt will demo no pelvic floor mm tensions and proper ROM in order to eliminate urine and restore pelvic floor function    Time  12    Period  Weeks    Status  Achieved      PT LONG TERM GOAL #6   Title  Pt will demo decreased scar restrictions over abdomen, IND with abdominal massage, and report more regular  bowel movements of 1-2 days in order to restore GI health    Time  12    Period  Weeks    Status  Achieved      PT LONG TERM GOAL #7   Title  Pt will demo no tensions/ tenderness at L sacrococcygeus ligament and coccygeus mm in order to sit for > 30 min and have no pain upon standing    Time  4    Period  Weeks    Status  Achieved      PT LONG TERM GOAL #8   Title  Pt will report no pain 100% of the time with crossing L ankle over R thigh and then getting up from chair  across 1 week in order to don shoes.     Time  4    Period  Weeks    Status  Achieved      PT LONG TERM GOAL  #9   TITLE  Pt will demo no lowered position of bladder and demo Grade 3/3/3/3 in order to improve pelvic girdle and back stability to increase physical activities without less risk for relapse    Time  8    Period  Weeks    Status  On-going      PT LONG TERM GOAL  #10   TITLE  Pt will increase plantarflexion reps from 10 reps B to > 20 reps B in order to climb stairs and progress towards balance training on uneven ground     Time  12    Period  Weeks    Status  On-going      PT LONG TERM GOAL  #11   TITLE  Pt  will walk on uneven ground, moderate hills on a local nature trail for 1 mile for minimal pain < 2 /10 and no ankle sprain in order to enjoy her hobby     Time  12    Period  Weeks    Status  On-going      PT LONG TERM GOAL  #12   TITLE  Pt will be able to fast walking ( levelled ground) for 45 min and compliance to stretching afterwards in order to  minimize risk for prolapse .      Time  8    Period  Weeks    Status  On-going      PT LONG TERM GOAL  #13   TITLE  Pt will be able to climb stairs with proper technique , step-through pattern with one hand on rail up/ down across one flight in order to navigate community environments     Time  8    Period  Weeks    Status  On-going      PT LONG TERM GOAL  #14   TITLE  Pt will report no calf pain with stairs up and down or  after walking at a  store for 15 min  in order to  progress towards dynamic balance exercises     Time  4    Period  Weeks    Status  On-going      PT LONG TERM GOAL  #15   TITLE  Pt will demo proper alignment and modifications with yoga to integrate into commuinity yoga classess with less risk for relapse of Sx     Time  12    Period  Weeks    Status  On-going            Plan - 06/30/18 1550    Clinical Impression Statement  Pt demo'd increased pelvic floor tightness bilaterally which decreased post Tx. Pt also had pelvic obliquties today which was corrected with an exercise technique that pt was able to perform IND. Applied manual Tx to dorsum of L foot as well which minimized tensions and pain during gait. Pt was able to walk with increased hip /knee flexion without pain at L hip, posterior thigh, and foot after Tx. Pt reports good carry over from last Tx with pain relief that lasted 3-4 days. Educated pt on proper sitting posture with proper foot placement on car floor when driving. Suspect pt's issues that pt presented with today are likely associated to poor alignment and posture with driving as pt drove multiple times this past week to [facility] in [place] ( 40 min drive each way). Pt continues to benefit from skilled PT     Rehab Potential  Good    PT Frequency  1x / week    PT Duration  12 weeks    PT Treatment/Interventions  Functional mobility training;Stair training;Gait training;Patient/family education;Aquatic Therapy;Therapeutic activities;Manual lymph drainage;Moist Heat;Neuromuscular re-education;Therapeutic exercise;Manual techniques;Taping;Energy conservation    Consulted and Agree with Plan of Care  Patient       Patient will benefit from skilled therapeutic intervention in order to improve the following deficits and impairments:  Abnormal gait, Pain, Increased fascial restricitons, Decreased strength, Decreased scar mobility, Increased muscle spasms, Difficulty walking, Decreased  mobility, Decreased range of motion, Decreased balance, Decreased safety awareness, Decreased coordination, Postural dysfunction, Improper body mechanics, Decreased endurance  Visit Diagnosis: Pain in right ankle and joints of right foot  Pain in left ankle and joints  of left foot  Chronic bilateral low back pain with left-sided sciatica  Myalgia     Problem List There are no active problems to display for this patient.   Mariane MastersYeung,Shin Yiing ,PT, DPT, E-RYT  06/30/2018, 3:54 PM  [facility] [facility] [address] [place], [place], [address] Phone: [phone]   Fax:  [phone]  Name: Carolyn Gregory MRN: [id number] Date of Birth: 09-11-1960

## 2018-06-30 NOTE — Patient Instructions (Signed)
  Stretch for pelvic floor     1) Frog stretch: laying on belly with pillow under hips, knees bent, inhale do nothing, exhale let ankles fall apart 45 deg  30r eps   2) "v heels slide away and then back toward buttocks and then rock knee to slight ,  slide heel along at 11 o clock away from buttocks   10 reps    ___  Driving position  4 points of contact of sittnig bones and heels/ feet 2 points of contact at PSIS ( sacrum)   ___ After driving or long walking:  Reset of pelvis with wall squat position and pelvic tilts

## 2018-07-08 ENCOUNTER — Ambulatory Visit: Payer: No Typology Code available for payment source | Admitting: Physical Therapy

## 2018-07-08 DIAGNOSIS — M5442 Lumbago with sciatica, left side: Secondary | ICD-10-CM

## 2018-07-08 DIAGNOSIS — M25572 Pain in left ankle and joints of left foot: Secondary | ICD-10-CM

## 2018-07-08 DIAGNOSIS — M791 Myalgia, unspecified site: Secondary | ICD-10-CM

## 2018-07-08 DIAGNOSIS — M25571 Pain in right ankle and joints of right foot: Secondary | ICD-10-CM

## 2018-07-08 DIAGNOSIS — G8929 Other chronic pain: Secondary | ICD-10-CM

## 2018-07-08 NOTE — Therapy (Signed)
[facility] MAIN [facility] [address] [place], [place], [address] Phone: [phone]   Fax:  [phone]  Physical Therapy Treatment  Patient Details  Name: Carolyn BosworthJennifer D Mccammon MRN: [id number] Date of Birth: [birth year] Referring Provider (PT): Berton MountAthena Howard,MD    Encounter Date: 07/08/2018  PT End of Session - 07/08/18 1121    Visit Number  27    Authorization Type  recert submitted to Dr. Alfonso Patteneans  05/23/18:   7/15 authorized visits     PT Start Time  1010    PT Stop Time  1117    PT Time Calculation (min)  67 min    Activity Tolerance  Patient tolerated treatment well;No increased pain    Behavior During Therapy  WFL for tasks assessed/performed       Past Medical History:  Diagnosis Date  . Anxiety   . Depression   . Hypertension     Past Surgical History:  Procedure Laterality Date  . CHOLECYSTECTOMY    . MOHS SURGERY     to remove benign growth  located in the L low abdomen  . R ankle surgery   2016   to repair lateral tendon   . VAGINAL HYSTERECTOMY      There were no vitals filed for this visit.  Subjective Assessment - 07/08/18 1015    Subjective  Pt reported was fine with "minimal pain" for 2 days after last session. The past 3 days, pt has felt medial R knee pain, R lateral ankle, numbness in back of mid thigh R,  and numbness of back and front of thigh L.  Pt was not able to sleep on her R side due to pain glut / side hip to outside of knee due to R pain 4/10 -8/10.  The pillow between the knees irritated the pain.  Pain decreased when she lied on her back          [facility] - 07/08/18 1023      Sensation   Light Touch  --   decreased sensation on L L3-5 compared to R    Additional Comments  post Tx:  L sensation equal wth R post Tx, RLE with decreased L5       Strength   Overall Strength Comments  L DF/EV 3+/5, R 4/5       Palpation   SI assessment   L PSIS more anterior in standing       Ambulation/Gait   Gait Comments  L pelvic sway , abducted patella , no more genu valgus                    [facility] Adult PT Treatment/Exercise - 07/08/18 1023      Ambulation/Gait   Gait velocity  1.11 m/s                   PT Long Term Goals - 06/23/18 1429      PT LONG TERM GOAL #1   Title  Pt will decrease her ODI score from 48% to < 38% in order to return to ADLs ( 10/30: 46%, 11/30: 41%38%)     Time  12    Period  Weeks    Status  Achieved      PT LONG TERM GOAL #2   Title  Pt will decrease her LEFS score from   25% to > 30%  in order to walk and have less pain at her  R ankle.  (11/30: 26%, 4/26: 29%, 6/19: 35%  )     Time  12    Period  Weeks    Status  Achieved      PT LONG TERM GOAL #3   Title  Pt will demo less B ankle instability on uneven ground, improved gait in order to minimize risk for injuries and falls     Time  12    Period  Weeks    Status  Revised      PT LONG TERM GOAL #4   Title  Pt will demo no pelvic obliquities and normal arthrokinematic motion of SIJ in order to minimize radiating pain to walk longer distances    Time  4    Period  Weeks    Status  Achieved      PT LONG TERM GOAL #5   Title  Pt will demo no pelvic floor mm tensions and proper ROM in order to eliminate urine and restore pelvic floor function    Time  12    Period  Weeks    Status  Achieved      PT LONG TERM GOAL #6   Title  Pt will demo decreased scar restrictions over abdomen, IND with abdominal massage, and report more regular bowel movements of 1-2 days in order to restore GI health    Time  12    Period  Weeks    Status  Achieved      PT LONG TERM GOAL #7   Title  Pt will demo no tensions/ tenderness at L sacrococcygeus ligament and coccygeus mm in order to sit for > 30 min and have no pain upon standing    Time  4    Period  Weeks    Status  Achieved      PT LONG TERM GOAL #8   Title  Pt will report no pain 100% of the time with crossing L ankle  over R thigh and then getting up from chair  across 1 week in order to don shoes.     Time  4    Period  Weeks    Status  Achieved      PT LONG TERM GOAL  #9   TITLE  Pt will demo no lowered position of bladder and demo Grade 3/3/3/3 in order to improve pelvic girdle and back stability to increase physical activities without less risk for relapse    Time  8    Period  Weeks    Status  On-going      PT LONG TERM GOAL  #10   TITLE  Pt will increase plantarflexion reps from 10 reps B to > 20 reps B in order to climb stairs and progress towards balance training on uneven ground     Time  12    Period  Weeks    Status  On-going      PT LONG TERM GOAL  #11   TITLE  Pt will walk on uneven ground, moderate hills on a local nature trail for 1 mile for minimal pain < 2 /10 and no ankle sprain in order to enjoy her hobby     Time  12    Period  Weeks    Status  On-going      PT LONG TERM GOAL  #12   TITLE  Pt will be able to fast walking ( levelled ground) for 45 min and compliance to stretching afterwards in order to  minimize risk for prolapse .      Time  8    Period  Weeks    Status  On-going      PT LONG TERM GOAL  #13   TITLE  Pt will be able to climb stairs with proper technique , step-through pattern with one hand on rail up/ down across one flight in order to navigate community environments     Time  8    Period  Weeks    Status  On-going      PT LONG TERM GOAL  #14   TITLE  Pt will report no calf pain with stairs up and down or  after walking at a store for 15 min  in order to  progress towards dynamic balance exercises     Time  4    Period  Weeks    Status  On-going      PT LONG TERM GOAL  #15   TITLE  Pt will demo proper alignment and modifications with yoga to integrate into commuinity yoga classess with less risk for relapse of Sx     Time  12    Period  Weeks    Status  On-going            Plan - 07/08/18 1127    Clinical Impression Statement  Addressed  abdominal scar restrictions today to restore reciporal motion of pelvis. Suspect abdominal scar restrictions is related to her L hip sway and associated lower kinetic chain Sx.Dermatome sensation on L thigh was restored. Added L ankle eversion strengthening due to weakness > R.   Pt reported the R medial knee pain is less and no more numbness at both thighs after the Tx.  Plan to continue address abdominal scar at upcoming sessions to improve gait, BLE issues,motility, and minimize pelvic floor dysfunctions. Pt continues to benefit from skilled PT     Rehab Potential  Good    PT Frequency  1x / week    PT Duration  12 weeks    PT Treatment/Interventions  Functional mobility training;Stair training;Gait training;Patient/family education;Aquatic Therapy;Therapeutic activities;Manual lymph drainage;Moist Heat;Neuromuscular re-education;Therapeutic exercise;Manual techniques;Taping;Energy conservation    Consulted and Agree with Plan of Care  Patient       Patient will benefit from skilled therapeutic intervention in order to improve the following deficits and impairments:  Abnormal gait, Pain, Increased fascial restricitons, Decreased strength, Decreased scar mobility, Increased muscle spasms, Difficulty walking, Decreased mobility, Decreased range of motion, Decreased balance, Decreased safety awareness, Decreased coordination, Postural dysfunction, Improper body mechanics, Decreased endurance  Visit Diagnosis: Pain in right ankle and joints of right foot  Pain in left ankle and joints of left foot  Chronic bilateral low back pain with left-sided sciatica  Myalgia     Problem List There are no active problems to display for this patient.   Mariane MastersYeung,Shin Yiing  ,PT, DPT, E-RYT  07/08/2018, 11:27 AM  [place] [facility] [address] [place], [place], [address] Phone: [phone]   Fax:  [phone]  Name: Carolyn BosworthJennifer D Gregory MRN:  [id number] Date of Birth: [birth year]

## 2018-07-08 NOTE — Patient Instructions (Signed)
Abdominal massage for scar release once a day  Vit E oil   __  Ankle strengthening on L with band band wrapped around outer L side of foot ballmound of L foot pressing onto band , R foot is placed on top of band hip width apart, with the ballmound ,  R hand holds the band 15 reps swinging L pinky toe out to the L  X 2x day

## 2018-07-14 ENCOUNTER — Ambulatory Visit: Payer: No Typology Code available for payment source | Attending: Internal Medicine | Admitting: Physical Therapy

## 2018-07-14 DIAGNOSIS — M5442 Lumbago with sciatica, left side: Secondary | ICD-10-CM | POA: Insufficient documentation

## 2018-07-14 DIAGNOSIS — M545 Low back pain: Secondary | ICD-10-CM | POA: Insufficient documentation

## 2018-07-14 DIAGNOSIS — M25571 Pain in right ankle and joints of right foot: Secondary | ICD-10-CM | POA: Diagnosis present

## 2018-07-14 DIAGNOSIS — G8929 Other chronic pain: Secondary | ICD-10-CM | POA: Insufficient documentation

## 2018-07-14 DIAGNOSIS — M25572 Pain in left ankle and joints of left foot: Secondary | ICD-10-CM | POA: Diagnosis present

## 2018-07-14 DIAGNOSIS — M791 Myalgia, unspecified site: Secondary | ICD-10-CM | POA: Diagnosis present

## 2018-07-14 NOTE — Patient Instructions (Signed)
Scoliosis specific stretches to length L low back   1) Lean to R side before the point of pain ( tip the teapot)  10 reps        2) REVERSE Warrior    :   Feet are hip width apart, L  foot one behind like you are on ski tracks,  R knee bent over ankle but not more forward then the ankle.  Make sure 50% weight is in the front foot/leg , 50% weight is the back foot/ leg     L hand on R thigh and make sure the R knee is in line with 2-3rd toes  Inhale lengthen spine first and reach up with R    Exhale turn navel to the R then the ribcage turns, look at the other wall. Rest L hand on R  thigh  Keep maintaining  50% weight is in the front foot/leg , 50% weight is the back foot/ leg  And make sure the front knee is still pointed in the toe line of the 2nd toe.    3-5 breaths here.   ___  Mini wall squat with small posterior pelvic tilts to press both back of the sacrum by the wall 5 reps   ___   Reclined twist for hips and side of the hips/ legs  Lay on your back, knees bend Scoot hips to the L , leave shoulders in place Drop knees to the R side 30 deg  ( pillow between knees  Back and forth with knees  With palms pressing upwards ( shallow depth) on abdominal scar

## 2018-07-14 NOTE — Therapy (Signed)
[facility] MAIN [facility] SERVICES [address] [place], [place], [address] Phone: [phone]   Fax:  [phone]  Physical Therapy Treatment  Patient Details  Name: Carolyn Gregory MRN: [id number] Date of Birth: 02-14-1961 No data recorded  Encounter Date: 07/14/2018  PT End of Session - 07/14/18 1257    Visit Number  28    Authorization Type  recert submitted to Dr. Alfonso Patten  05/23/18:   8/15 authorized visits     PT Start Time  1117    PT Stop Time  1204    PT Time Calculation (min)  47 min    Activity Tolerance  Patient tolerated treatment well;No increased pain    Behavior During Therapy  WFL for tasks assessed/performed       Past Medical History:  Diagnosis Date  . Anxiety   . Depression   . Hypertension     Past Surgical History:  Procedure Laterality Date  . CHOLECYSTECTOMY    . MOHS SURGERY     to remove benign growth  located in the L low abdomen  . R ankle surgery   2016   to repair lateral tendon   . VAGINAL HYSTERECTOMY      There were no vitals filed for this visit.  Subjective Assessment - 07/14/18 1121    Subjective   After last session, pt noticed the pelvic tilt aggravated her low back pain. Since the session, pt feels shooting and throbbing pain the right inside of the knee and front of the knee 8/10, L 5/10 in the front of the knee with some pain to the inside as well.  The numbness in  back of both thighs have decreased from 7-8/10 to 4/10  since last session.   Pt has throbbing and shooting pain in the outside of R ankle and some pain inside the ankle bone, L on the outside by the ankle pain. Pt has not been walking this past week because of the pain in the back which occurs with sitting and walking.  The pain inthe back is from teh mid to lower back on the L side. It does not shoot down.           [facility] PT Assessment - 07/14/18 1127      Observation/Other Assessments   Observations  R shoulder higher than L, R   LLE length ASIS to medial malleoli 90 cm, L 91 cm       Palpation   SI assessment   standing, L iliac crest higher than R slightly    slight L convex curve    Palpation comment  moderate scar restrictions miedial aspect. no tightness at hamstrings/adductors / feet compared to previous session    increased QL tensions L      Ambulation/Gait   Gait Comments  L pelvic sway, slight L thorax rotation anteriorly with stance on L, ( post Tx:  more reciporal gait with throax and pelvic movements, less pelvic sway                   [facility] Adult PT Treatment/Exercise - 07/14/18 1252      Therapeutic Activites    Therapeutic Activities  --   applied shoe lift in R after assessing leg length difference   Other Therapeutic Activities  squatting/ bending with proper knee alignment, new scoliosis HEP with proper knee/ hip alignment,     role of scoliosis on gait deficits and pain  Neuro Re-ed    Neuro Re-ed Details   verbal and tactile cues for propioception of lower kinetic chain in new HEP       Manual Therapy   Manual therapy comments  STM/ MWM at abdominal scar                   PT Long Term Goals - 06/23/18 1429      PT LONG TERM GOAL #1   Title  Pt will decrease her ODI score from 48% to < 38% in order to return to ADLs ( 10/30: 46%, 11/30: 46%38%)     Time  12    Period  Weeks    Status  Achieved      PT LONG TERM GOAL #2   Title  Pt will decrease her LEFS score from   25% to > 30%  in order to walk and have less pain at her R ankle.  (11/30: 26%, 4/26: 29%, 6/19: 35%  )     Time  12    Period  Weeks    Status  Achieved      PT LONG TERM GOAL #3   Title  Pt will demo less B ankle instability on uneven ground, improved gait in order to minimize risk for injuries and falls     Time  12    Period  Weeks    Status  Revised      PT LONG TERM GOAL #4   Title  Pt will demo no pelvic obliquities and normal arthrokinematic motion of SIJ in order to minimize  radiating pain to walk longer distances    Time  4    Period  Weeks    Status  Achieved      PT LONG TERM GOAL #5   Title  Pt will demo no pelvic floor mm tensions and proper ROM in order to eliminate urine and restore pelvic floor function    Time  12    Period  Weeks    Status  Achieved      PT LONG TERM GOAL #6   Title  Pt will demo decreased scar restrictions over abdomen, IND with abdominal massage, and report more regular bowel movements of 1-2 days in order to restore GI health    Time  12    Period  Weeks    Status  Achieved      PT LONG TERM GOAL #7   Title  Pt will demo no tensions/ tenderness at L sacrococcygeus ligament and coccygeus mm in order to sit for > 30 min and have no pain upon standing    Time  4    Period  Weeks    Status  Achieved      PT LONG TERM GOAL #8   Title  Pt will report no pain 100% of the time with crossing L ankle over R thigh and then getting up from chair  across 1 week in order to don shoes.     Time  4    Period  Weeks    Status  Achieved      PT LONG TERM GOAL  #9   TITLE  Pt will demo no lowered position of bladder and demo Grade 3/3/3/3 in order to improve pelvic girdle and back stability to increase physical activities without less risk for relapse    Time  8    Period  Weeks    Status  On-going  PT LONG TERM GOAL  #10   TITLE  Pt will increase plantarflexion reps from 10 reps B to > 20 reps B in order to climb stairs and progress towards balance training on uneven ground     Time  12    Period  Weeks    Status  On-going      PT LONG TERM GOAL  #11   TITLE  Pt will walk on uneven ground, moderate hills on a local nature trail for 1 mile for minimal pain < 2 /10 and no ankle sprain in order to enjoy her hobby     Time  12    Period  Weeks    Status  On-going      PT LONG TERM GOAL  #12   TITLE  Pt will be able to fast walking ( levelled ground) for 45 min and compliance to stretching afterwards in order to  minimize risk  for prolapse .      Time  8    Period  Weeks    Status  On-going      PT LONG TERM GOAL  #13   TITLE  Pt will be able to climb stairs with proper technique , step-through pattern with one hand on rail up/ down across one flight in order to navigate community environments     Time  8    Period  Weeks    Status  On-going      PT LONG TERM GOAL  #14   TITLE  Pt will report no calf pain with stairs up and down or  after walking at a store for 15 min  in order to  progress towards dynamic balance exercises     Time  4    Period  Weeks    Status  On-going      PT LONG TERM GOAL  #15   TITLE  Pt will demo proper alignment and modifications with yoga to integrate into commuinity yoga classess with less risk for relapse of Sx     Time  12    Period  Weeks    Status  On-going            Plan - 07/14/18 1259    Clinical Impression Statement After last session, pt experienced increased L low back pain which is likely associated with her socliosis which was revealed today with assessment.   Pt showed L lumbar convex curve, shorter leg length compared to L, and increased L QL tightness/ shortening 2/2 curve. In gait, pt demo'd L pelvic sway and L thoracic rotation anteriorly. Ruling out one-sided direction of pull 2/2 abdominal scar restrictions with continuation of manual Tx to minimize severe restrictions.  Pt's previously tight muscles in BLE and paraspinals and gluts and feet were absent, revealing one-sided compliants are more related to scoliosis .  Addressed leg length difference with R shoe lift and further applied abdominal scar releases which helped to restore reciporal pelvic and thorax pattern in gait and decrease pain in knees/ ankles bilaterally. Pt required moderate cues for proper knee alignment in new HEP to minimize medial knee pain on R.  Added scoliosis specific HEP to lengthen L low back where L lumbar convex exists. Plan to address upper quarter ( L shoulder lower than R) in  upcoming sessions. Modified new HEP to address pt's shoulder pain. Withheld closed kinetic chain in UE component to scoliosis HEP. Withholding resistance band exercises as well and plan to apply more  open chain to minimize relapse of tight muscles. Pt continues to benefit from skilled PT.      Rehab Potential  Good    PT Frequency  1x / week    PT Duration  12 weeks    PT Treatment/Interventions  Functional mobility training;Stair training;Gait training;Patient/family education;Aquatic Therapy;Therapeutic activities;Manual lymph drainage;Moist Heat;Neuromuscular re-education;Therapeutic exercise;Manual techniques;Taping;Energy conservation    Consulted and Agree with Plan of Care  Patient       Patient will benefit from skilled therapeutic intervention in order to improve the following deficits and impairments:  Abnormal gait, Pain, Increased fascial restricitons, Decreased strength, Decreased scar mobility, Increased muscle spasms, Difficulty walking, Decreased mobility, Decreased range of motion, Decreased balance, Decreased safety awareness, Decreased coordination, Postural dysfunction, Improper body mechanics, Decreased endurance  Visit Diagnosis: No diagnosis found.     Problem List There are no active problems to display for this patient.   Mariane MastersYeung,Shin Yiing ,PT, DPT, E-RYT  07/14/2018, 1:05 PM  [place] [facility] [address] [place], [place], [address] Phone: [phone]   Fax:  [phone]  Name: Carolyn Gregory MRN: [id number] Date of Birth: January 02, 1961

## 2018-07-18 ENCOUNTER — Encounter: Payer: Self-pay | Admitting: Emergency Medicine

## 2018-07-18 ENCOUNTER — Emergency Department
Admission: EM | Admit: 2018-07-18 | Discharge: 2018-07-18 | Disposition: A | Discharge status: Home / Self Care | Payer: No Typology Code available for payment source | Attending: Emergency Medicine | Admitting: Emergency Medicine

## 2018-07-18 DIAGNOSIS — M5441 Lumbago with sciatica, right side: Secondary | ICD-10-CM | POA: Diagnosis not present

## 2018-07-18 DIAGNOSIS — R8271 Bacteriuria: Secondary | ICD-10-CM | POA: Diagnosis not present

## 2018-07-18 DIAGNOSIS — M545 Low back pain: Secondary | ICD-10-CM | POA: Diagnosis present

## 2018-07-18 DIAGNOSIS — I10 Essential (primary) hypertension: Secondary | ICD-10-CM | POA: Insufficient documentation

## 2018-07-18 DIAGNOSIS — Z79899 Other long term (current) drug therapy: Secondary | ICD-10-CM | POA: Insufficient documentation

## 2018-07-18 DIAGNOSIS — M5442 Lumbago with sciatica, left side: Secondary | ICD-10-CM | POA: Insufficient documentation

## 2018-07-18 HISTORY — DX: Dorsalgia, unspecified: M54.9

## 2018-07-18 LAB — URINALYSIS, COMPLETE (UACMP) WITH MICROSCOPIC
BILIRUBIN URINE: NEGATIVE
Glucose, UA: NEGATIVE mg/dL
HGB URINE DIPSTICK: NEGATIVE
KETONES UR: NEGATIVE mg/dL
LEUKOCYTES UA: NEGATIVE
NITRITE: NEGATIVE
PH: 5 (ref 5.0–8.0)
PROTEIN: NEGATIVE mg/dL
Specific Gravity, Urine: 1.018 (ref 1.005–1.030)

## 2018-07-18 MED ORDER — CYCLOBENZAPRINE HCL 5 MG PO TABS: ORAL_TABLET | ORAL | 0 refills | Status: DC

## 2018-07-18 MED ORDER — IBUPROFEN 600 MG PO TABS: 600.0000 mg | ORAL_TABLET | Freq: Four times a day (QID) | ORAL | 0 refills | Status: DC | PRN

## 2018-07-18 MED ORDER — KETOROLAC TROMETHAMINE 30 MG/ML IJ SOLN: 30.0000 mg | Freq: Once | INTRAMUSCULAR | Status: DC

## 2018-07-18 MED ORDER — KETOROLAC TROMETHAMINE 10 MG PO TABS: 10.0000 mg | ORAL_TABLET | Freq: Four times a day (QID) | ORAL | 0 refills | Status: DC | PRN

## 2018-07-18 MED ORDER — LIDOCAINE 5 % EX PTCH: 1.0000 | MEDICATED_PATCH | CUTANEOUS | 0 refills | Status: DC

## 2018-07-18 MED ORDER — CEPHALEXIN 500 MG PO CAPS: 500.0000 mg | ORAL_CAPSULE | Freq: Two times a day (BID) | ORAL | 0 refills | Status: DC

## 2018-07-18 MED ORDER — CEPHALEXIN 500 MG PO CAPS: 500.0000 mg | ORAL_CAPSULE | Freq: Two times a day (BID) | ORAL | 0 refills | Status: AC

## 2018-07-18 NOTE — ED Provider Notes (Signed)
[facility] Emergency Department Provider Note  ____________________________________________  Time seen: Approximately 1:14 PM  I have reviewed the triage vital signs and the nursing notes.   HISTORY  Chief Complaint Back Pain    HPI Carolyn Gregory is a 58 y.o. female that presents to emergency department for evaluation of low back pain that worsened this morning when patient was putting on your pants.  She was buttoning her pants when pain started.  She did not hear a pop.  Patient states that she has felt bilateral shooting pains and numbness down the back of both legs on and off for months.  Pain worsened this morning and is primarily to her left side.  Pain starts in bilateral buttocks.  She has not had any dysuria but has noticed a foul smell to her urine.  She was on her way to a gynecology appointment for gynecology to strengthen her pelvic floor when she decided to come to the emergency department.  She has discussed these shooting pains and numbness to her legs with physical therapy and she recalls being told that it is due to her weakened pelvic floor muscles.  She has a history of back pain and has been told that she has had sciatica and scoliosis before.  She is a borderline diabetic and had blood work done yesterday with primary care.  No bowel or bladder dysfunction or saddle anesthesias.   Past Medical History:  Diagnosis Date  . Anxiety   . Back pain   . Depression   . Hypertension     There are no active problems to display for this patient.   Past Surgical History:  Procedure Laterality Date  . CHOLECYSTECTOMY    . MOHS SURGERY     to remove benign growth  located in the L low abdomen  . R ankle surgery   2016   to repair lateral tendon   . VAGINAL HYSTERECTOMY      Prior to Admission medications   Medication Sig Start Date End Date Taking? Authorizing Provider  cephALEXin (KEFLEX) 500 MG capsule Take 1 capsule (500 mg total) by  mouth 2 (two) times daily for 10 days. 07/18/18 07/28/18  Enid Derry, PA-C  cholecalciferol (VITAMIN D) 400 units TABS tablet Take 1,000 Units by mouth.    [provider]  cyclobenzaprine (FLEXERIL) 5 MG tablet Take 1-2 tablets 3 times daily as needed 07/18/18   Enid Derry, PA-C  dicyclomine (BENTYL) 20 MG tablet Take 1 tablet (20 mg total) by mouth every 6 (six) hours as needed. 06/26/17   Irean Hong, MD  famotidine (PEPCID) 20 MG tablet Take 1 tablet (20 mg total) by mouth 2 (two) times daily. 06/26/17   Irean Hong, MD  gabapentin (NEURONTIN) 100 MG capsule Take 100 mg by mouth 3 (three) times daily.    [provider]  hydrOXYzine (ATARAX/VISTARIL) 25 MG tablet Take 25 mg by mouth 3 (three) times daily as needed.    [provider]  ibuprofen (ADVIL,MOTRIN) 600 MG tablet Take 1 tablet (600 mg total) by mouth every 6 (six) hours as needed. 07/18/18   Enid Derry, PA-C  lidocaine (LIDODERM) 5 % Place 1 patch onto the skin daily. Remove & Discard patch within 12 hours or as directed by MD 07/18/18   Enid Derry, PA-C  ondansetron (ZOFRAN ODT) 4 MG disintegrating tablet Take 1 tablet (4 mg total) by mouth every 8 (eight) hours as needed for nausea or vomiting. 06/25/17  Minna Antis, MD  pantoprazole (PROTONIX) 40 MG tablet Take 40 mg by mouth daily.    [provider]  traMADol (ULTRAM) 50 MG tablet Take 1 tablet (50 mg total) by mouth every 6 (six) hours as needed. 08/28/17   Triplett, Cari B, FNP  triamterene-hydrochlorothiazide (DYAZIDE) 37.5-25 MG capsule Take 1 capsule by mouth daily.    [provider]    Allergies Codeine and Hctz [hydrochlorothiazide]  Family History  Problem Relation Age of Onset  . Diabetes Mother     Social History Social History   Tobacco Use  . Smoking status: Never Smoker  . Smokeless tobacco: Never Used  Substance Use Topics  . Alcohol use: No  . Drug use: No     Review of Systems   Constitutional: No fever/chills ENT: No upper respiratory complaints. Cardiovascular: No chest pain. Respiratory: No cough. No SOB. Gastrointestinal: No abdominal pain.  No nausea, no vomiting.  Genitourinary: Negative for dysuria. Musculoskeletal: Positive for back pain.  Skin: Negative for rash, abrasions, lacerations, ecchymosis. Neurological: Negative for headaches.  Positive for numbness.   ____________________________________________   PHYSICAL EXAM:  VITAL SIGNS: ED Triage Vitals  Enc Vitals Group     BP 07/18/18 1224 116/79     Pulse Rate 07/18/18 1224 (!) 52     Resp 07/18/18 1224 14     Temp 07/18/18 1224 98.1 F (36.7 C)     Temp Source 07/18/18 1224 Oral     SpO2 07/18/18 1224 100 %     Weight 07/18/18 1159 240 lb (108.9 kg)     Height 07/18/18 1159 5\' 7"  (1.702 m)     Head Circumference --      Peak Flow --      Pain Score 07/18/18 1159 10     Pain Loc --      Pain Edu? --      Excl. in GC? --      Constitutional: Alert and oriented. Well appearing and in no acute distress. Eyes: Conjunctivae are normal. PERRL. EOMI. Head: Atraumatic. ENT:      Ears:      Nose: No congestion/rhinnorhea.      Mouth/Throat: Mucous membranes are moist.  Neck: No stridor.  Cardiovascular: Normal rate, regular rhythm.  Good peripheral circulation. Respiratory: Normal respiratory effort without tachypnea or retractions. Lungs CTAB. Good air entry to the bases with no decreased or absent breath sounds. Gastrointestinal: Bowel sounds 4 quadrants. Soft and nontender to palpation. No guarding or rigidity. No palpable masses. No distention. No CVA tenderness. Musculoskeletal: Full range of motion to all extremities. No gross deformities appreciated.  Able to get herself up and out of the chair.  Normal but slow gait.  Tenderness to palpation in right and left sciatic notch.  Strength equal in lower extremities bilaterally. Neurologic:  Normal speech and language. No gross focal  neurologic deficits are appreciated.  Skin:  Skin is warm, dry and intact. No rash noted. Psychiatric: Mood and affect are normal. Speech and behavior are normal. Patient exhibits appropriate insight and judgement.   ____________________________________________   LABS (all labs ordered are listed, but only abnormal results are displayed)  Labs Reviewed  URINALYSIS, COMPLETE (UACMP) WITH MICROSCOPIC - Abnormal; Notable for the following components:      Result Value   Color, Urine YELLOW (*)    APPearance CLEAR (*)    Bacteria, UA MANY (*)    All other components within normal limits   ____________________________________________  EKG  ____________________________________________  RADIOLOGY   No results found.  ____________________________________________    PROCEDURES  Procedure(s) performed:    Procedures    Medications  ketorolac (TORADOL) 30 MG/ML injection 30 mg (30 mg Intramuscular Not Given 07/18/18 1507)     ____________________________________________   INITIAL IMPRESSION / ASSESSMENT AND PLAN / ED COURSE  Pertinent labs & imaging results that were available during my care of the patient were reviewed by me and considered in my medical decision making (see chart for details).  Review of the [place] CSRS was performed in accordance of the [facility] prior to dispensing any controlled drugs.   Patient's diagnosis is consistent with sciatica and urinary tract infection.  Vital signs and exam are reassuring.  Urinalysis shows many bacteria.  Exam is consistent with sciatica.  Overall patient appears well.  She is able to get up and walk without difficulty.  Patient will be discharged home with prescriptions for Keflex, Toradol, Flexeril, Lidoderm. Patient is to follow up with primary care as directed. Patient is given ED precautions to return to the ED for any worsening or new symptoms.     ____________________________________________  FINAL CLINICAL  IMPRESSION(S) / ED DIAGNOSES  Final diagnoses:  Acute bilateral low back pain with bilateral sciatica  Bacteria in urine        This chart was dictated using voice recognition software/Dragon. Despite best efforts to proofread, errors can occur which can change the meaning. Any change was purely unintentional.    Enid DerryWagner, Clariece Roesler, PA-C 07/18/18 1530    Don PerkingVeronese, [place], MD 07/20/18 [phone]

## 2018-07-18 NOTE — ED Notes (Signed)
Right lower back pain and numbness back or right leg today.  Wants to stay in chair as it feels better to sit up.  No urinary sx

## 2018-07-18 NOTE — ED Triage Notes (Signed)
Pt reports low back pain that started today. Pt reports hx of the same and states that she as on the way the way to the MD and had to turn and come here. Pt denies reports urinary frequency, denies pain.

## 2018-07-21 ENCOUNTER — Ambulatory Visit: Payer: No Typology Code available for payment source | Admitting: Physical Therapy

## 2018-07-21 DIAGNOSIS — M25572 Pain in left ankle and joints of left foot: Secondary | ICD-10-CM

## 2018-07-21 DIAGNOSIS — G8929 Other chronic pain: Secondary | ICD-10-CM

## 2018-07-21 DIAGNOSIS — M5442 Lumbago with sciatica, left side: Secondary | ICD-10-CM

## 2018-07-21 DIAGNOSIS — M25571 Pain in right ankle and joints of right foot: Secondary | ICD-10-CM

## 2018-07-21 DIAGNOSIS — M791 Myalgia, unspecified site: Secondary | ICD-10-CM

## 2018-07-21 NOTE — Patient Instructions (Signed)
Scoliosis summary so far for the L  low back:  _wall lean on forearm 5 breaths x 3-5 x day _standing twist ( to R ) 3-5 x day    Today:  1) Multifidis twist   "The Box" turn  ( FEET AND LEGS FIRST PLANTED)  Band is on doorknob: stand further away from door (facing perpendicular)   Twisting trunk without moving the hips and knees Hold band at the level of ribcage, elbows bent,shoulder blades roll back and down like squeezing a pencil under armpit    Exhale twist,.10-15 deg away from door without moving your hips/ knees. Continue to maintain equal weight through legs. Keep knee unlocked.  5 and then build up to 15reps in morning and night   2) compliment stretch,   seated, L outside ankle against the R leg, gently pulling front of the knee to lengthen L hip  face forward not twist    3)   "Mountain hiker "  - band under L heel , band behind L buttock, resting shoulder down, elbow by ribs to not activate the upper trap neck shoulder, pulling down not overhead  Lunge position, tes point forward, trunk leans to create diagonal line with back leg)  Ski track stance, band under back foot,  50% weight on front leg (knee above ankle)  50% weight on back leg   Band are against buttocks and band Elbows point to the sky, hands holding band   Inhale, exhale ,  resting shoulder down, elbow by ribs to not activate the upper trap neck shoulder, pulling down not overhead  10 reps on each leg  X 2 sets

## 2018-07-22 NOTE — Therapy (Addendum)
[facility] MAIN [facility] SERVICES [address] [place], [place], [address] Phone: [phone]   Fax:  [phone]  Physical Therapy Treatment  Patient Details  Name: Carolyn Gregory MRN: [id number] Date of Birth: [birth year] No data recorded  Encounter Date: 07/21/2018  PT End of Session - 07/22/18 0853    Visit Number  29    Date for PT Re-Evaluation  08/14/18    Authorization Type  recert submitted to Dr. Alfonso Patten on  05/23/18:   9/15 authorized visits     PT Start Time  1004    PT Stop Time  1110    PT Time Calculation (min)  66 min    Activity Tolerance  Patient tolerated treatment well;No increased pain    Behavior During Therapy  WFL for tasks assessed/performed       Past Medical History:  Diagnosis Date  . Anxiety   . Back pain   . Depression   . Hypertension     Past Surgical History:  Procedure Laterality Date  . CHOLECYSTECTOMY    . MOHS SURGERY     to remove benign growth  located in the L low abdomen  . R ankle surgery   2016   to repair lateral tendon   . VAGINAL HYSTERECTOMY      There were no vitals filed for this visit.  Subjective Assessment - 07/21/18 1009    Subjective  After leaving last session with shoe lift, pt was fine for one day. On the 2nd day after the session, pt noticed the same type of numbness on the back of the B thighs but it did not last as long compared to the previous week. On the 4th day after session, pt was in bed sitting with R LE extended , pt experiened pain from the back of the knee to the sitting bone 7/10 " dull throbbing.  Then on the 5th day after the session, ( Friday), pt experienced a pain from a 2/10 to greater than a 10/10 when she was trying a pair of slacks that was tight in the midsection of belly. The pain increased and she was not able to put shoe on. Pt was on the way to the [facility] hospital for a f/u appt but while driving, the pain was so much that she turned around and went to the  ER. She was told that she had sciatica and a UTI. Pt has not picked up on her antibiotics yet.  Today, the pain is better. But the shooting pain occurs at 8/10 with walking and with gradual inclines . B pain at low back occurs with B slight turns at the waist.  Her medial knee pain has improved overall since last session from 7-8/10 to 5/10 with the help of shoe lift.  Pt also has more than 50% improvement with her ankle and feet B across the past 2 weeks.             [facility] PT Assessment - 07/22/18 0854      Observation/Other Assessments   Observations  L low back more anterior compared to R 2/2 to lumbar convex curve, L shoulder lower .       Lunges   Comments  cued for longer stride length for new HEP to length lumbar spine , no complaint of radiating pain to posterior thigh B      Ambulation/Gait   Gait Comments  less pelvic sway to L today,  [facility] Adult PT Treatment/Exercise - 07/22/18 0853      Neuro Re-ed    Neuro Re-ed Details   see pt instructions and modified scoliosis HEP to minimize overuse of upper trap , provided weightbearing / stabilization cues to promote more disassociation between trunk and pelvis in multifidis strengthening L       Exercises   Exercises  --   repeated new HEP, modifed UE position to minimize overuse upp trap and shoulder issues            PT Education - 07/22/18 0856    Education provided  Yes    Education Details  Provided explanation of importance to maintain global mm stretches. Explained anatomy and asymmetries 2/2 scoliosis contributing to LBP. Explained the differential Dx for acute LBP/ radiating pain ( DVT vs cauda equina vs UTI Sx). Encouraged pt to f/u on UTI and taking antibiotics. Explained the source of radiating pain posterior thighs B due to L convex curve/L mm shortening/  L lumbar anterior rotation 2/2 scoliosis and abdominal scar restrictions      Person(s) Educated  Patient    Methods   Explanation    Comprehension  Verbalized understanding;Returned demonstration;Verbal cues required          PT Long Term Goals - 06/23/18 1429      PT LONG TERM GOAL #1   Title  Pt will decrease her ODI score from 48% to < 38% in order to return to ADLs ( 10/30: 46%, 11/30: 16%)     Time  12    Period  Weeks    Status  Achieved      PT LONG TERM GOAL #2   Title  Pt will decrease her LEFS score from   25% to > 30%  in order to walk and have less pain at her R ankle.  (11/30: 26%, 4/26: 29%, 6/19: 35%  )     Time  12    Period  Weeks    Status  Achieved      PT LONG TERM GOAL #3   Title  Pt will demo less B ankle instability on uneven ground, improved gait in order to minimize risk for injuries and falls     Time  12    Period  Weeks    Status  Revised      PT LONG TERM GOAL #4   Title  Pt will demo no pelvic obliquities and normal arthrokinematic motion of SIJ in order to minimize radiating pain to walk longer distances    Time  4    Period  Weeks    Status  Achieved      PT LONG TERM GOAL #5   Title  Pt will demo no pelvic floor mm tensions and proper ROM in order to eliminate urine and restore pelvic floor function    Time  12    Period  Weeks    Status  Achieved      PT LONG TERM GOAL #6   Title  Pt will demo decreased scar restrictions over abdomen, IND with abdominal massage, and report more regular bowel movements of 1-2 days in order to restore GI health    Time  12    Period  Weeks    Status  Achieved      PT LONG TERM GOAL #7   Title  Pt will demo no tensions/ tenderness at L sacrococcygeus ligament and coccygeus mm in order to sit for > 30  min and have no pain upon standing    Time  4    Period  Weeks    Status  Achieved      PT LONG TERM GOAL #8   Title  Pt will report no pain 100% of the time with crossing L ankle over R thigh and then getting up from chair  across 1 week in order to don shoes.     Time  4    Period  Weeks    Status  Achieved       PT LONG TERM GOAL  #9   TITLE  Pt will demo no lowered position of bladder and demo Grade 3/3/3/3 in order to improve pelvic girdle and back stability to increase physical activities without less risk for relapse    Time  8    Period  Weeks    Status  On-going      PT LONG TERM GOAL  #10   TITLE  Pt will increase plantarflexion reps from 10 reps B to > 20 reps B in order to climb stairs and progress towards balance training on uneven ground     Time  12    Period  Weeks    Status  On-going      PT LONG TERM GOAL  #11   TITLE  Pt will walk on uneven ground, moderate hills on a local nature trail for 1 mile for minimal pain < 2 /10 and no ankle sprain in order to enjoy her hobby     Time  12    Period  Weeks    Status  On-going      PT LONG TERM GOAL  #12   TITLE  Pt will be able to fast walking ( levelled ground) for 45 min and compliance to stretching afterwards in order to  minimize risk for prolapse .      Time  8    Period  Weeks    Status  On-going      PT LONG TERM GOAL  #13   TITLE  Pt will be able to climb stairs with proper technique , step-through pattern with one hand on rail up/ down across one flight in order to navigate community environments     Time  8    Period  Weeks    Status  On-going      PT LONG TERM GOAL  #14   TITLE  Pt will report no calf pain with stairs up and down or  after walking at a store for 15 min  in order to  progress towards dynamic balance exercises     Time  4    Period  Weeks    Status  On-going      PT LONG TERM GOAL  #15   TITLE  Pt will demo proper alignment and modifications with yoga to integrate into commuinity yoga classess with less risk for relapse of Sx     Time  12    Period  Weeks    Status  On-going            Plan - 07/22/18 1610    Clinical Impression Statement  Pt's lower extremity deficits are improving as she reports less pain at knees, ankle, feet in gait and she also demonstrates resolved genu valgus,  improved hip flexion/ knee flexion, longer stride in gait. Focusing the past 2 sessions on pt's thoracolumbar scoliosis which has become more evident as pt has continued  to minimize global mm tightness with compliance to stretches. Suspect pt' primary complaint with B radiating pain from LBP to posterior thigh is likely due to L convex curve and shortening of mm on that side. Added specific exercises today to lengthen L lumbar and minimize anterior rotation of lumbar spine. Pt was able to perform new HEP but required modification to minimize upper trap overuse and minimize shoulder complaint and neuro-reedu to ensure technique and alignment.   Provided explanation of importance to maintain global mm stretches. Explained anatomy and asymmetries 2/2 scoliosis contributing to LBP.   Pt had a visit to the ER for acute LBP and was explained she had sciatic and UTI.   DPT explained the differential Dx for acute LBP/ radiating pain ( DVT vs cauda equina vs UTI Sx). Encouraged pt to f/u on UTI and taking antibiotics. Explained the source of radiating pain posterior thighs B due to L convex curve/L mm shortening/  L lumbar anterior rotation 2/2 scoliosis and abdominal scar restrictions    Pt continues to benefit from skilled PT to minimize worsening of scoliosis which is contributing to radiating LBP issues and pain with walking and turning trunk.  Plan to continue making adjustments to spine at thoracic region 2/2 thoracolumbar curves.       Rehab Potential  Good    PT Frequency  1x / week    PT Duration  12 weeks    PT Treatment/Interventions  Functional mobility training;Stair training;Gait training;Patient/family education;Aquatic Therapy;Therapeutic activities;Manual lymph drainage;Moist Heat;Neuromuscular re-education;Therapeutic exercise;Manual techniques;Taping;Energy conservation    Consulted and Agree with Plan of Care  Patient       Patient will benefit from skilled therapeutic intervention in  order to improve the following deficits and impairments:  Abnormal gait, Pain, Increased fascial restricitons, Decreased strength, Decreased scar mobility, Increased muscle spasms, Difficulty walking, Decreased mobility, Decreased range of motion, Decreased balance, Decreased safety awareness, Decreased coordination, Postural dysfunction, Improper body mechanics, Decreased endurance  Visit Diagnosis: Pain in right ankle and joints of right foot  Pain in left ankle and joints of left foot  Chronic bilateral low back pain with left-sided sciatica  Myalgia     Problem List There are no active problems to display for this patient.   Mariane MastersYeung,Shin Yiing ,PT, DPT, E-RYT  07/22/2018, 9:01 AM  [facility] [address] [place], [place], [address] Phone: [phone]   Fax:  [phone]  Name: Carolyn Gregory MRN: [id number] Date of Birth: 1960-07-19

## 2018-07-28 ENCOUNTER — Ambulatory Visit: Payer: No Typology Code available for payment source | Admitting: Physical Therapy

## 2018-07-28 DIAGNOSIS — G8929 Other chronic pain: Secondary | ICD-10-CM

## 2018-07-28 DIAGNOSIS — M25571 Pain in right ankle and joints of right foot: Secondary | ICD-10-CM | POA: Diagnosis not present

## 2018-07-28 DIAGNOSIS — M791 Myalgia, unspecified site: Secondary | ICD-10-CM

## 2018-07-28 DIAGNOSIS — M25572 Pain in left ankle and joints of left foot: Secondary | ICD-10-CM

## 2018-07-28 DIAGNOSIS — M5442 Lumbago with sciatica, left side: Secondary | ICD-10-CM

## 2018-07-28 NOTE — Patient Instructions (Signed)
Leg movements while gently pulling ends of abdominal scars  Leg movements on exhale   30 reps with heel sides and knee out one at a time on exhale _______ 2 x day      ____    Proper body mechanics with getting out of a chair to decrease strain  on back &pelvic floor   Avoid holding your breath when Getting out of the chair:  Scoot to front part of chair chair Heels behind feet, feet are hip width apart, nose over toes  Inhale like you are smelling roses Exhale to stand    WITH GREEN BAND AT [facility] Press thigh out against band  On the way and down   More weight bearing onacross ballmounds ( 1st toe)    5 x reps   4 x day

## 2018-07-29 NOTE — Therapy (Signed)
[place] MAIN [facility] SERVICES [address] [place], [place], [address] Phone: [phone]   Fax:  [phone]  Physical Therapy Treatment  Patient Details  Name: Carolyn Gregory MRN: [id number] Date of Birth: 06/27/1961 No data recorded  Encounter Date: 07/28/2018  PT End of Session - 07/29/18 1229    Visit Number  30    Date for PT Re-Evaluation  08/14/18    Authorization Type  recert submitted to Dr. Forbes Cellar on  05/23/18:   10/15 authorized visits through 09/26/18    PT Start Time  1110    PT Stop Time  1220    PT Time Calculation (min)  70 min    Activity Tolerance  Patient tolerated treatment well;No increased pain    Behavior During Therapy  WFL for tasks assessed/performed       Past Medical History:  Diagnosis Date  . Anxiety   . Back pain   . Depression   . Hypertension     Past Surgical History:  Procedure Laterality Date  . CHOLECYSTECTOMY    . MOHS SURGERY     to remove benign growth  located in the L low abdomen  . R ankle surgery   2016   to repair lateral tendon   . VAGINAL HYSTERECTOMY      There were no vitals filed for this visit.  Subjective Assessment - 07/28/18 1113    Subjective  Pt stopped using the band with the exercise due to hand pain. Pt performed it without the band and still was able to get the lengthening in her back. Pt would like for her knees to get addressed today. Pt noticed excuriating pain when turning to get out of her car on R knee like something shifted on the knee cap. There is not as much pain on the ankles.  When she tries to bend back more to get flexibility in R ankle ( pt demonstrates DF in [facility]) , there is pain and tightness. It is easier to push off the feet when walking.  The LBP is mostly on L side and had improved by 80%. Pt is taking antibiotics for her UTI. Pt notices pain level of 3/10 on L low back when standing to braid her hair > 5 min, but she still has to shift to the R to  offset the discomfort. Pt was standing barefoot at this time.  Pt met with a Chaplain after last session which pt felt like a safe place to share some concerns and felt little lighter and more hopeful.           [facility] PT Assessment - 07/29/18 1229      Observation/Other Assessments   Observations  cross L ankle over thigh ( reported pain at medial knee). Post Tx: no medial knee on L when crossing L ankle over R thigh        Sit to Stand   Comments  less pain in the outer knee on L with tactile cues for hip abd, on rise.    Pain remains at the medial knee on the descent descpite tactile cues to not adduct .  Requried cues for more eversion of feet, abduction of hips B        Other:   Other/ Comments  simulated gettign out of the car,  demo'd incremental movements       AROM   Overall AROM Comments  all directions of spine with no reproduction of LBP, R  sideflexion with report of pulling in the L abdominal area . L abductor/flex/ER limited at end range compared to R in hooklying and seated.  Pt demo'd compensation in spine to cross L ankle on thigh       Palpation   Spinal mobility  L paraspinal / QL less tight on L    SI assessment   PSIS more equal, iliac crest levelled.        Ambulation/Gait   Gait Comments  reciporcal movement of hip to thorax restored today                    [facility] Adult PT Treatment/Exercise - 07/29/18 1225      Self-Care   Self-Care  --   edu technique for self mobilization on abdominal scar/ MWM     Therapeutic Activites    Other Therapeutic Activities  explained connection of fascial restriction to hip mobility when crossing L ankle over R thigh which is the cause for medial L knee pain, explained reason for noticing LBP when standing barefoot is due to leg l ength difference, Explained to pt about ensuring incremental lower kinetic chain/ hip movements with car t/f         Moist Heat Therapy   Number Minutes Moist Heat  5 Minutes    Moist  Heat Location  --   abdomen     Manual Therapy   Manual therapy comments  STM/ MWM at abdominal scar                   PT Long Term Goals - 06/23/18 1429      PT LONG TERM GOAL #1   Title  Pt will decrease her ODI score from 48% to < 38% in order to return to ADLs ( 10/30: 46%, 11/30: 38%)     Time  12    Period  Weeks    Status  Achieved      PT LONG TERM GOAL #2   Title  Pt will decrease her LEFS score from   25% to > 30%  in order to walk and have less pain at her R ankle.  (11/30: 26%, 4/26: 29%, 6/19: 35%  )     Time  12    Period  Weeks    Status  Achieved      PT LONG TERM GOAL #3   Title  Pt will demo less B ankle instability on uneven ground, improved gait in order to minimize risk for injuries and falls     Time  12    Period  Weeks    Status  Revised      PT LONG TERM GOAL #4   Title  Pt will demo no pelvic obliquities and normal arthrokinematic motion of SIJ in order to minimize radiating pain to walk longer distances    Time  4    Period  Weeks    Status  Achieved      PT LONG TERM GOAL #5   Title  Pt will demo no pelvic floor mm tensions and proper ROM in order to eliminate urine and restore pelvic floor function    Time  12    Period  Weeks    Status  Achieved      PT LONG TERM GOAL #6   Title  Pt will demo decreased scar restrictions over abdomen, IND with abdominal massage, and report more regular bowel movements of 1-2 days in order to  restore GI health    Time  12    Period  Weeks    Status  Achieved      PT LONG TERM GOAL #7   Title  Pt will demo no tensions/ tenderness at L sacrococcygeus ligament and coccygeus mm in order to sit for > 30 min and have no pain upon standing    Time  4    Period  Weeks    Status  Achieved      PT LONG TERM GOAL #8   Title  Pt will report no pain 100% of the time with crossing L ankle over R thigh and then getting up from chair  across 1 week in order to don shoes.     Time  4    Period  Weeks     Status  Achieved      PT LONG TERM GOAL  #9   TITLE  Pt will demo no lowered position of bladder and demo Grade 3/3/3/3 in order to improve pelvic girdle and back stability to increase physical activities without less risk for relapse    Time  8    Period  Weeks    Status  On-going      PT LONG TERM GOAL  #10   TITLE  Pt will increase plantarflexion reps from 10 reps B to > 20 reps B in order to climb stairs and progress towards balance training on uneven ground     Time  12    Period  Weeks    Status  On-going      PT LONG TERM GOAL  #11   TITLE  Pt will walk on uneven ground, moderate hills on a local nature trail for 1 mile for minimal pain < 2 /10 and no ankle sprain in order to enjoy her hobby     Time  12    Period  Weeks    Status  On-going      PT LONG TERM GOAL  #12   TITLE  Pt will be able to fast walking ( levelled ground) for 45 min and compliance to stretching afterwards in order to  minimize risk for prolapse .      Time  8    Period  Weeks    Status  On-going      PT LONG TERM GOAL  #13   TITLE  Pt will be able to climb stairs with proper technique , step-through pattern with one hand on rail up/ down across one flight in order to navigate community environments     Time  8    Period  Weeks    Status  On-going      PT LONG TERM GOAL  #14   TITLE  Pt will report no calf pain with stairs up and down or  after walking at a store for 15 min  in order to  progress towards dynamic balance exercises     Time  4    Period  Weeks    Status  On-going      PT LONG TERM GOAL  #15   TITLE  Pt will demo proper alignment and modifications with yoga to integrate into commuinity yoga classess with less risk for relapse of Sx     Time  12    Period  Weeks    Status  On-going            Plan - 07/29/18 1231    Clinical  Impression Statement  Pt is demonstrating good carry over with gait mechanics compared to previous visits ( reciporcal movement with hip / thorax and  less L pelvic sway, increased stride/ BOS, push-off, transverse are co-activation B, less genu valgus).  These achievements indicate scoliosis-specific stretches are effective from last visit.  Pt had to discontinue using  Pt is taking antibiotics for UTI. Pt's LBP is improving by 80%.    Pt requested that her knee pain be the primary focus today. Pt reported her knee pain occurred with sit to stand and with crossing L ankle over R thigh.  Manual Tx was applied to further release abdominal scar restrictions which contributed to increasing hip AROM for crossing L ankle on R thigh for donning shoes.  Pt's L knee pain improved with this position post Tx and pt demo'd less compensation of spine.  Neuromuscular re-education with strengthening hips helped to minimize knee pain with sit to stand. Pt will benefit from further loaded tasks to restore function of lower kinetic chain and minimize imbalances 2/2 scoliosis. Plan to address upper spine and asymmetrical shoulder height.   Pt was provided education on etiology and MSK imbalances 2/2 scoliosis related to pain at low back,lower kinetic chain, and upper spine/ BUE.     Pt met with a Chaplain after last session which pt felt like a safe place to share some concerns and felt little lighter and more hopeful. Pt is demonstrating improved stress-management skills with use of biopsychosocial approaches while still implementing manual Tx, neuromuscular re-education, HEP interventions for musculoskeletal impairments.    Pt continues to benefit from skilled PT.         Rehab Potential  Good    PT Frequency  1x / week    PT Duration  12 weeks    PT Treatment/Interventions  Functional mobility training;Stair training;Gait training;Patient/family education;Aquatic Therapy;Therapeutic activities;Manual lymph drainage;Moist Heat;Neuromuscular re-education;Therapeutic exercise;Manual techniques;Taping;Energy conservation    Consulted and Agree with Plan of Care   Patient       Patient will benefit from skilled therapeutic intervention in order to improve the following deficits and impairments:  Abnormal gait, Pain, Increased fascial restricitons, Decreased strength, Decreased scar mobility, Increased muscle spasms, Difficulty walking, Decreased mobility, Decreased range of motion, Decreased balance, Decreased safety awareness, Decreased coordination, Postural dysfunction, Improper body mechanics, Decreased endurance  Visit Diagnosis: Pain in right ankle and joints of right foot  Pain in left ankle and joints of left foot  Chronic bilateral low back pain with left-sided sciatica  Myalgia     Problem List There are no active problems to display for this patient.   [name] ,PT, DPT, E-RYT  07/29/2018, 12:32 PM  [place] MAIN [facility] SERVICES [address] [place], [place], [address] Phone: [phone]   Fax:  [phone]  Name: Carolyn Gregory MRN: [id number] Date of Birth: [birth year]

## 2018-08-04 ENCOUNTER — Ambulatory Visit: Payer: No Typology Code available for payment source | Admitting: Physical Therapy

## 2018-08-10 ENCOUNTER — Emergency Department
Admission: EM | Admit: 2018-08-10 | Discharge: 2018-08-10 | Discharge status: Against Medical Advice | Payer: Non-veteran care

## 2018-08-11 ENCOUNTER — Ambulatory Visit: Payer: No Typology Code available for payment source | Admitting: Physical Therapy

## 2018-08-18 ENCOUNTER — Ambulatory Visit: Payer: No Typology Code available for payment source | Attending: Internal Medicine | Admitting: Physical Therapy

## 2018-08-18 DIAGNOSIS — G8929 Other chronic pain: Secondary | ICD-10-CM | POA: Diagnosis present

## 2018-08-18 DIAGNOSIS — M25572 Pain in left ankle and joints of left foot: Secondary | ICD-10-CM | POA: Diagnosis present

## 2018-08-18 DIAGNOSIS — M5442 Lumbago with sciatica, left side: Secondary | ICD-10-CM | POA: Diagnosis present

## 2018-08-18 DIAGNOSIS — M25571 Pain in right ankle and joints of right foot: Secondary | ICD-10-CM | POA: Diagnosis not present

## 2018-08-18 DIAGNOSIS — M791 Myalgia, unspecified site: Secondary | ICD-10-CM | POA: Insufficient documentation

## 2018-08-18 NOTE — Patient Instructions (Signed)
Seated marching instead of walking routine this weekend 3 min Hands on chair for more points of contact   ___  Quad stretch    ___  2 laps  Hand on hallway with longer strides, not locking knees,  Shoulders leaning over knees ____   Foot massage over the top of L foot ( between rays)

## 2018-08-19 NOTE — Therapy (Addendum)
[place] MAIN [facility] SERVICES [address] [place], [place], [address] Phone: [phone]   Fax:  [phone]  Physical Therapy Treatment / Progress Note  Patient Details  Name: Carolyn Gregory MRN: [id number] Date of Birth: Feb 11, 1961 No data recorded  Encounter Date: 08/18/2018    Past Medical History:  Diagnosis Date  . Anxiety   . Back pain   . Depression   . Hypertension     Past Surgical History:  Procedure Laterality Date  . CHOLECYSTECTOMY    . MOHS SURGERY     to remove benign growth  located in the L low abdomen  . R ankle surgery   2016   to repair lateral tendon   . VAGINAL HYSTERECTOMY      There were no vitals filed for this visit.  Subjective Assessment - 08/19/18 2102    Subjective   On 08/16/18, Saturday, pt hurt her L ankle while stepping on the curb and had turned the ankle in.  Pt went to the [place] and Xrays were done which no Fx. Pt elevated it and iced it and the swelling has gone down. Pain at the injury date 10/10. Today is her first day to be able to put more weight on it.  Pt has to change her walking because the L ankle pain is at 6/10.  Pt is concerned today about noticing radiating pain on R posterior thigh but  has no radiating pain on L posterior thigh.  This radiating pain is not occur daily which is an improvement. Pt noticed this pain when walking yesterday but it did not cause her to buckle.  Pt was walking for a period of 3 minutes and then noticed L knee and foot pain/ ankle and burning sensation on both thighs.  This subsided only when she stopped walking.    Pt saw the podiatrist and has xrays done on her R ankle but went to see him for L ankle.     Pt has not gone to be fitted for inserts for her shoes.    Pt reports her knee pain is not as bad overall at 6/10 instead of 8-9/10.  Pt has been evaluated for knee pain at the [facility] and and went for one PT visit  but she has not returned because she didnot  receive hands on therapy. Pt will still come here for PT on the knee and back.          [facility] PT Assessment - 08/19/18 2102      Strength   Overall Strength Comments  R knee flexion 4-/5, L 4/5 ( all gross BLE including hip abd/ ext 4/5 B )       Palpation   SI assessment   tightness L glut med, hypomobile at L SIJ into ER/ ext, limited sacral nutation ( post Tx improved and no pain with hip ext )      Palpation comment  L extensor retinaculum tightness       Ambulation/Gait   Gait Comments  hyperextension of B knees in gait.  Post training with longer strides, increased hip flexion, more anterior COM< less hyperextension of knees .Marland KitchenCued for single UE on wall)                     [facility] Adult PT Treatment/Exercise - 08/19/18 2102      Neuro Re-ed    Neuro Re-ed Details   cued for longer strides, less knee  hyperextension         Moist Heat Therapy   Moist Heat Location  --   L glut, L foot      Manual Therapy   Manual therapy comments  L long axis distraction, rotational mob L iliac crest, MWM for more mobility L SIJ ,  fascial release over L extensor retinaculum                    PT Long Term Goals - 08/18/18 1130      PT LONG TERM GOAL #1   Title  Pt will decrease her ODI score from 48% to < 38% in order to return to ADLs ( 10/30: 46%, 11/30: 38%)     Time  12    Period  Weeks    Status  Achieved      PT LONG TERM GOAL #2   Title  Pt will decrease her LEFS score from   25% to > 30%  in order to walk and have less pain at her R ankle.  (11/30: 26%, 4/26: 29%, 6/19: 35%  )     Time  12    Period  Weeks    Status  Achieved      PT LONG TERM GOAL #3   Title  Pt will demo less B ankle instability on uneven ground, improved gait in order to minimize risk for injuries and falls     Time  12    Period  Weeks    Status  Revised      PT LONG TERM GOAL #4   Title  Pt will demo no pelvic obliquities and normal arthrokinematic motion of SIJ in order to  minimize radiating pain to walk longer distances    Time  4    Period  Weeks    Status  Achieved      PT LONG TERM GOAL #5   Title  Pt will demo no pelvic floor mm tensions and proper ROM in order to eliminate urine and restore pelvic floor function    Time  12    Period  Weeks    Status  Achieved      PT LONG TERM GOAL #6   Title  Pt will demo decreased scar restrictions over abdomen, IND with abdominal massage, and report more regular bowel movements of 1-2 days in order to restore GI health    Time  12    Period  Weeks    Status  Achieved      PT LONG TERM GOAL #7   Title  Pt will demo no tensions/ tenderness at L sacrococcygeus ligament and coccygeus mm in order to sit for > 30 min and have no pain upon standing    Time  4    Period  Weeks    Status  Achieved      PT LONG TERM GOAL #8   Title  Pt will report no pain 100% of the time with crossing L ankle over R thigh and then getting up from chair  across 1 week in order to don shoes.     Time  4    Period  Weeks    Status  Achieved      PT LONG TERM GOAL  #9   TITLE  Pt will demo no lowered position of bladder and demo Grade 3/3/3/3 in order to improve pelvic girdle and back stability to increase physical activities without less risk for relapse  Time  8    Period  Weeks    Status  On-going      PT LONG TERM GOAL  #10   TITLE  Pt will increase plantarflexion reps from 10 reps B to > 20 reps B in order to climb stairs and progress towards balance training on uneven ground     Time  12    Period  Weeks    Status  On-going      PT LONG TERM GOAL  #11   TITLE  Pt will walk on uneven ground, moderate hills on a local nature trail for 1 mile for minimal pain < 2 /10 and no ankle sprain in order to enjoy her hobby     Time  12    Period  Weeks    Status  On-going      PT LONG TERM GOAL  #12   TITLE  Pt will be able to fast walking ( levelled ground) for 45 min and compliance to stretching afterwards in order to   minimize risk for prolapse .      Time  8    Period  Weeks    Status  On-going      PT LONG TERM GOAL  #13   TITLE  Pt will be able to climb stairs with proper technique , step-through pattern with one hand on rail up/ down across one flight in order to navigate community environments     Time  8    Period  Weeks    Status  On-going      PT LONG TERM GOAL  #14   TITLE  Pt will report no calf pain with stairs up and down or  after walking at a store for 15 min  in order to  progress towards dynamic balance exercises     Time  4    Period  Weeks    Status  Partially Met      PT LONG TERM GOAL  #15   TITLE  Pt will demo proper alignment and modifications with yoga to integrate into commuinity yoga classess with less risk for relapse of Sx     Time  12    Period  Weeks    Status  On-going            Plan - 08/19/18 2113    Clinical Impression Statement  Pt has achieved 7/15 goals and is progressing well towards remaining goals. Pt has demonstrated significantly decreased mm tightness, increased mobility, and improved gait mechanics, and decreasing radiating  Pain from LBP, and gradually decreasing knee/ ankle/ foot pain. Pt has benefited greatly from manual Tx. Customized HEP for her scoliosis has also been important in her progress. Continuing to progress with gait mechanics to build walking endurance and balance on levelled and uneven grounds due to pt's  Hx of lower extremity injuries.    Pt sustained L ankle injury while stepping up on a curb on 08/16/18. Pt went to the ER and xrays were neg for Fx. Pt reported swelling has gone down and she has started to be able to pt weight on it today. Pt requested to have the LBP w/ R posterior thigh pain addressed today. Assessment showed pelvic obliquities which was corrected with manual Tx. Also addressed L tightness over extensor retinaculum with manual Tx afterwhich pt reported pain was less when walking on L foot. Pt continues to benefit  from skilled PT.  Rehab Potential  Good    PT Frequency  1x / week    PT Duration  12 weeks    PT Treatment/Interventions  Functional mobility training;Stair training;Gait training;Patient/family education;Aquatic Therapy;Therapeutic activities;Manual lymph drainage;Moist Heat;Neuromuscular re-education;Therapeutic exercise;Manual techniques;Taping;Energy conservation    Consulted and Agree with Plan of Care  Patient       Patient will benefit from skilled therapeutic intervention in order to improve the following deficits and impairments:  Abnormal gait, Pain, Increased fascial restricitons, Decreased strength, Decreased scar mobility, Increased muscle spasms, Difficulty walking, Decreased mobility, Decreased range of motion, Decreased balance, Decreased safety awareness, Decreased coordination, Postural dysfunction, Improper body mechanics, Decreased endurance  Visit Diagnosis: Pain in right ankle and joints of right foot  Pain in left ankle and joints of left foot  Chronic bilateral low back pain with left-sided sciatica  Myalgia     Problem List There are no active problems to display for this patient.   [name] ,PT, DPT, E-RYT  08/19/2018, 9:14 PM  [place] MAIN [facility] SERVICES [address] [place], [place], [address] Phone: [phone]   Fax:  [phone]  Name: Carolyn Gregory MRN: [id number] Date of Birth: [birth year]

## 2018-08-19 NOTE — Addendum Note (Signed)
Addended by: Mariane Masters on: 08/19/2018 09:29 PM   Modules accepted: Orders

## 2018-08-25 ENCOUNTER — Encounter: Payer: Non-veteran care | Admitting: Physical Therapy

## 2018-08-29 ENCOUNTER — Ambulatory Visit: Payer: No Typology Code available for payment source | Admitting: Physical Therapy

## 2018-09-01 ENCOUNTER — Ambulatory Visit: Payer: No Typology Code available for payment source | Admitting: Physical Therapy

## 2018-09-01 ENCOUNTER — Encounter: Payer: Non-veteran care | Admitting: Physical Therapy

## 2018-09-01 DIAGNOSIS — M25571 Pain in right ankle and joints of right foot: Secondary | ICD-10-CM | POA: Diagnosis not present

## 2018-09-01 DIAGNOSIS — M791 Myalgia, unspecified site: Secondary | ICD-10-CM

## 2018-09-01 DIAGNOSIS — M25572 Pain in left ankle and joints of left foot: Secondary | ICD-10-CM

## 2018-09-01 DIAGNOSIS — M5442 Lumbago with sciatica, left side: Secondary | ICD-10-CM

## 2018-09-01 DIAGNOSIS — G8929 Other chronic pain: Secondary | ICD-10-CM

## 2018-09-01 NOTE — Therapy (Signed)
[place] MAIN [facility] SERVICES [address] [place], [place], [address] Phone: [phone]   Fax:  [phone]  Physical Therapy Treatment  Patient Details  Name: Carolyn Gregory MRN: [id number] Date of Birth: 1961-03-04 No data recorded  Encounter Date: 09/01/2018  PT End of Session - 09/01/18 1116    Visit Number  32    Date for PT Re-Evaluation  11/10/18    Authorization Type  recert submitted to Dr. Marlou Sa resubmitted 2/20.      11/15 visits authorized visits through 09/26/18    PT Start Time  1111    PT Stop Time  1215    PT Time Calculation (min)  64 min    Activity Tolerance  Patient tolerated treatment well;No increased pain    Behavior During Therapy  WFL for tasks assessed/performed       Past Medical History:  Diagnosis Date  . Anxiety   . Back pain   . Depression   . Hypertension     Past Surgical History:  Procedure Laterality Date  . CHOLECYSTECTOMY    . MOHS SURGERY     to remove benign growth  located in the L low abdomen  . R ankle surgery   2016   to repair lateral tendon   . VAGINAL HYSTERECTOMY      There were no vitals filed for this visit.  Subjective Assessment - 09/01/18 1114    Subjective  Pt has been hurting more and numbness radiating down from buttocks to calf on the R since last session. When she massages R lateral ankle, pt notices radiating pain in the area. This area also cramps up when walking. Pt has painand weakness on both sides. Pt noticed L medial knee pain that was shooting when she was bending and reaching. The shooting pain was quick.          [facility] PT Assessment - 09/01/18 1227      Observation/Other Assessments   Observations  no L pain with seated mofication compared to hooklying        Sit to Stand   Comments  increased BUE support over thighs, increased anterior COM over knees.  No genu valgus       Palpation   SI assessment   pain at L knee at sidelying  but no pain at L knee  with cue for pelvic neutral/ less lumbar lordosis .      Palpation comment  increased tightness at adductor hallucis ( transverse head), abductor digiti minimi , hypomobility at midfoot, cuboid/navicular , limited in DF/EV,       Ambulation/Gait   Gait Comments  no genu valgus, less hyperextension, no cues for more anterior COM, Cued for hip /knee flex , required single UE                    [facility] Adult PT Treatment/Exercise - 09/01/18 1227      Neuro Re-ed    Neuro Re-ed Details   cued for increased hip/ knee flexion. Good carryover with more fore/midfoot striking. Modified glut mm stretch into seated position instead of hooklying to minimize knee pain.       Moist Heat Therapy   Moist Heat Location  --   R ankle      Manual Therapy   Manual therapy comments  STM at R glut med, STM/MWM Grade II at midfoot joints , STM at instrinsic mm noted in assessment  PT Long Term Goals - 08/18/18 1130      PT LONG TERM GOAL #1   Title  Pt will decrease her ODI score from 48% to < 38% in order to return to ADLs ( 10/30: 46%, 11/30: 38%)     Time  12    Period  Weeks    Status  Achieved      PT LONG TERM GOAL #2   Title  Pt will decrease her LEFS score from   25% to > 30%  in order to walk and have less pain at her R ankle.  (11/30: 26%, 4/26: 29%, 6/19: 35%  )     Time  12    Period  Weeks    Status  Achieved      PT LONG TERM GOAL #3   Title  Pt will demo less B ankle instability on uneven ground, improved gait in order to minimize risk for injuries and falls     Time  12    Period  Weeks    Status  Revised      PT LONG TERM GOAL #4   Title  Pt will demo no pelvic obliquities and normal arthrokinematic motion of SIJ in order to minimize radiating pain to walk longer distances    Time  4    Period  Weeks    Status  Achieved      PT LONG TERM GOAL #5   Title  Pt will demo no pelvic floor mm tensions and proper ROM in order to eliminate urine  and restore pelvic floor function    Time  12    Period  Weeks    Status  Achieved      PT LONG TERM GOAL #6   Title  Pt will demo decreased scar restrictions over abdomen, IND with abdominal massage, and report more regular bowel movements of 1-2 days in order to restore GI health    Time  12    Period  Weeks    Status  Achieved      PT LONG TERM GOAL #7   Title  Pt will demo no tensions/ tenderness at L sacrococcygeus ligament and coccygeus mm in order to sit for > 30 min and have no pain upon standing    Time  4    Period  Weeks    Status  Achieved      PT LONG TERM GOAL #8   Title  Pt will report no pain 100% of the time with crossing L ankle over R thigh and then getting up from chair  across 1 week in order to don shoes.     Time  4    Period  Weeks    Status  Achieved      PT LONG TERM GOAL  #9   TITLE  Pt will demo no lowered position of bladder and demo Grade 3/3/3/3 in order to improve pelvic girdle and back stability to increase physical activities without less risk for relapse    Time  8    Period  Weeks    Status  On-going      PT LONG TERM GOAL  #10   TITLE  Pt will increase plantarflexion reps from 10 reps B to > 20 reps B in order to climb stairs and progress towards balance training on uneven ground     Time  12    Period  Weeks    Status  On-going  PT LONG TERM GOAL  #11   TITLE  Pt will walk on uneven ground, moderate hills on a local nature trail for 1 mile for minimal pain < 2 /10 and no ankle sprain in order to enjoy her hobby     Time  12    Period  Weeks    Status  On-going      PT LONG TERM GOAL  #12   TITLE  Pt will be able to fast walking ( levelled ground) for 45 min and compliance to stretching afterwards in order to  minimize risk for prolapse .      Time  8    Period  Weeks    Status  On-going      PT LONG TERM GOAL  #13   TITLE  Pt will be able to climb stairs with proper technique , step-through pattern with one hand on rail up/  down across one flight in order to navigate community environments     Time  8    Period  Weeks    Status  On-going      PT LONG TERM GOAL  #14   TITLE  Pt will report no calf pain with stairs up and down or  after walking at a store for 15 min  in order to  progress towards dynamic balance exercises     Time  4    Period  Weeks    Status  Partially Met      PT LONG TERM GOAL  #15   TITLE  Pt will demo proper alignment and modifications with yoga to integrate into commuinity yoga classess with less risk for relapse of Sx     Time  12    Period  Weeks    Status  On-going            Plan - 09/01/18 1225    Clinical Impression Statement  Pt showed good carry over with pelvic alignment, no genu valgus, less hyperxtension of B knees, and no longer with mm imbalances 2/2 scoliosis.  Today, pt showed limited R DF/EV and mobility in R instrinsic foot mm which improved post Tx. Pt showed increased DF/EV and reported less pain with gait with increased hip flex/knee and longer stride length without pain post Tx. Pt also showed tightness at R hip abductor mm which decreased post Tx. Modified hip lengthening ex into seated position to minimizie knee pain. Pt required cues to decrease anterior weight over knees in sit to stand and demo'd proper technique without pain. Pain science education provided.  Recommended pt to buy a portable pedal exercise machine for aerobic and lower kinetic chain ROM and strengthening. Pt continues to benefit from skilled PT.    Rehab Potential  Good    PT Frequency  1x / week    PT Duration  12 weeks    PT Treatment/Interventions  Functional mobility training;Stair training;Gait training;Patient/family education;Aquatic Therapy;Therapeutic activities;Manual lymph drainage;Moist Heat;Neuromuscular re-education;Therapeutic exercise;Manual techniques;Taping;Energy conservation    Consulted and Agree with Plan of Care  Patient       Patient will benefit from skilled  therapeutic intervention in order to improve the following deficits and impairments:  Abnormal gait, Pain, Increased fascial restricitons, Decreased strength, Decreased scar mobility, Increased muscle spasms, Difficulty walking, Decreased mobility, Decreased range of motion, Decreased balance, Decreased safety awareness, Decreased coordination, Postural dysfunction, Improper body mechanics, Decreased endurance  Visit Diagnosis: Pain in right ankle and joints of right foot  Pain in  left ankle and joints of left foot  Chronic bilateral low back pain with left-sided sciatica  Myalgia     Problem List There are no active problems to display for this patient.   [name] ,PT, DPT, E-RYT  09/01/2018, 1:55 PM  [place] MAIN [facility] SERVICES [address] [place], [place], [address] Phone: [phone]   Fax:  [phone]  Name: Carolyn Gregory MRN: [id number] Date of Birth: May 31, 1961

## 2018-09-01 NOTE — Patient Instructions (Addendum)
Glut stretches :  Propped on stool with one leg  Cross thigh over thigh  5 reps    __________   Ankle pumps, Band with toes up and out   ___________   Walking with higher thighs  FOCUS ON BALANCE WEIGHT THROUGHOUT THE BODY instead of noticing and being alarmed by certain parts  _____________   SIT TO STAND   Proper body mechanics with getting out of a chair to decrease strain  on back &pelvic floor   Avoid holding your breath when Getting out of the chair:  Scoot to front part of chair chair Heels behind feet, feet are hip width apart, nose over toes   Hands push off the seat not the on thighs   Inhale like you are smelling roses Exhale to stand   ____   Buy a Portable Pedal and we will discuss how to use it.

## 2018-09-04 ENCOUNTER — Encounter: Payer: Non-veteran care | Admitting: Physical Therapy

## 2018-09-08 ENCOUNTER — Ambulatory Visit: Payer: No Typology Code available for payment source | Admitting: Physical Therapy

## 2018-09-12 ENCOUNTER — Encounter: Payer: Non-veteran care | Admitting: Physical Therapy

## 2018-09-15 ENCOUNTER — Encounter: Payer: Non-veteran care | Admitting: Physical Therapy

## 2018-09-23 ENCOUNTER — Ambulatory Visit: Payer: No Typology Code available for payment source | Admitting: Physical Therapy

## 2018-09-30 ENCOUNTER — Encounter: Payer: Non-veteran care | Admitting: Physical Therapy

## 2018-09-30 ENCOUNTER — Telehealth: Payer: Self-pay | Admitting: Physical Therapy

## 2018-09-30 NOTE — Telephone Encounter (Signed)
Physical therapist left a message to f/u with pt in regards to how she is doing with HEP and POC and hoping pt is doing well. Explained about the closure of clinic for 2 weeks due to COVID-19.  If pt has any questions, please email therapist or via MyChart.

## 2018-10-07 ENCOUNTER — Encounter: Payer: Non-veteran care | Admitting: Physical Therapy

## 2018-10-08 ENCOUNTER — Telehealth (HOSPITAL_COMMUNITY): Payer: Self-pay | Admitting: Psychiatry

## 2018-10-14 ENCOUNTER — Encounter: Payer: Non-veteran care | Admitting: Physical Therapy

## 2018-10-21 ENCOUNTER — Encounter: Payer: Non-veteran care | Admitting: Physical Therapy

## 2018-10-28 ENCOUNTER — Encounter: Payer: Non-veteran care | Admitting: Physical Therapy

## 2018-11-04 ENCOUNTER — Encounter: Payer: Non-veteran care | Admitting: Physical Therapy

## 2018-11-11 ENCOUNTER — Encounter: Payer: Non-veteran care | Admitting: Physical Therapy
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
## 2018-11-18 ENCOUNTER — Encounter: Payer: Non-veteran care | Admitting: Physical Therapy

## 2018-11-25 ENCOUNTER — Encounter: Payer: Non-veteran care | Admitting: Physical Therapy

## 2018-12-02 ENCOUNTER — Encounter: Payer: Non-veteran care | Admitting: Physical Therapy

## 2018-12-09 ENCOUNTER — Encounter: Payer: Non-veteran care | Admitting: Physical Therapy

## 2018-12-16 ENCOUNTER — Encounter: Payer: Non-veteran care | Admitting: Physical Therapy

## 2018-12-23 ENCOUNTER — Encounter: Payer: Non-veteran care | Admitting: Physical Therapy

## 2018-12-30 ENCOUNTER — Encounter: Payer: Non-veteran care | Admitting: Physical Therapy

## 2019-01-06 ENCOUNTER — Encounter: Payer: Non-veteran care | Admitting: Physical Therapy

## 2019-01-06 NOTE — Therapy (Signed)
[place] MAIN [facility] SERVICES [address] [place], [place], [address] Phone: [phone]   Fax:  [phone]  Patient Details  Name: Carolyn Gregory MRN: [id number] Date of Birth: [birth year] Referring Provider:  Yehuda Savannah, MD  Encounter Date: 12/25/2017  Patient cancelled this date, chart was opened by [name] for review in preparation for session.  No associated charges this date and note was run to remove chart from open chart list.      Tanise Russman 01/06/2019, 9:41 AM  [place], [place], [address] Phone: [phone]   Fax:  [phone]

## 2019-02-25 ENCOUNTER — Emergency Department: Payer: No Typology Code available for payment source

## 2019-02-25 ENCOUNTER — Other Ambulatory Visit: Payer: Self-pay

## 2019-02-25 ENCOUNTER — Emergency Department
Admission: EM | Admit: 2019-02-25 | Discharge: 2019-02-25 | Disposition: A | Discharge status: Home / Self Care | Payer: No Typology Code available for payment source | Attending: Emergency Medicine | Admitting: Emergency Medicine

## 2019-02-25 DIAGNOSIS — M25561 Pain in right knee: Secondary | ICD-10-CM | POA: Insufficient documentation

## 2019-02-25 DIAGNOSIS — Z79899 Other long term (current) drug therapy: Secondary | ICD-10-CM | POA: Diagnosis not present

## 2019-02-25 DIAGNOSIS — I10 Essential (primary) hypertension: Secondary | ICD-10-CM | POA: Diagnosis not present

## 2019-02-25 MED ORDER — KETOROLAC TROMETHAMINE 60 MG/2ML IM SOLN
60.0000 mg | Freq: Once | INTRAMUSCULAR | Status: AC
  Administered 2019-02-25: 08:00:00 60 mg via INTRAMUSCULAR
  Filled 2019-02-25: qty 2

## 2019-02-25 MED ORDER — KETOROLAC TROMETHAMINE 10 MG PO TABS: 10.0000 mg | ORAL_TABLET | Freq: Four times a day (QID) | ORAL | 0 refills | Status: DC | PRN
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
## 2019-02-25 NOTE — ED Triage Notes (Signed)
Left knee pain radiating down leg and into left ankle X 2 days. Pt alert and oriented X4, cooperative, RR even and unlabored, color WNL. Pt in NAD. Worse with weight bearing.
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
## 2019-02-25 NOTE — Discharge Instructions (Addendum)
Wear knee support and take medication as directed.  Follow-up orthopedic for definitive evaluation and treatment.

## 2020-03-02 ENCOUNTER — Encounter: Payer: Self-pay | Admitting: Physical Therapy

## 2020-03-02 ENCOUNTER — Other Ambulatory Visit: Payer: Self-pay

## 2020-03-02 ENCOUNTER — Ambulatory Visit
Payer: No Typology Code available for payment source | Attending: Obstetrics and Gynecology | Admitting: Physical Therapy

## 2020-03-02 DIAGNOSIS — R293 Abnormal posture: Secondary | ICD-10-CM | POA: Diagnosis present

## 2020-03-02 DIAGNOSIS — M62838 Other muscle spasm: Secondary | ICD-10-CM | POA: Diagnosis not present

## 2020-03-02 DIAGNOSIS — R279 Unspecified lack of coordination: Secondary | ICD-10-CM
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
## 2020-03-07 NOTE — Addendum Note (Signed)
Addended by: Beatris Si on: 03/07/2020 04:56 PM   Modules accepted: Orders

## 2020-03-08 ENCOUNTER — Encounter: Payer: Self-pay | Admitting: Physical Therapy

## 2020-03-08 ENCOUNTER — Ambulatory Visit: Payer: No Typology Code available for payment source | Admitting: Physical Therapy

## 2020-03-08 ENCOUNTER — Other Ambulatory Visit: Payer: Self-pay

## 2020-03-08 DIAGNOSIS — R279 Unspecified lack of coordination: Secondary | ICD-10-CM

## 2020-03-08 DIAGNOSIS — M62838 Other muscle spasm: Secondary | ICD-10-CM

## 2020-03-08 DIAGNOSIS — R293 Abnormal posture: Secondary | ICD-10-CM

## 2020-03-08 NOTE — Patient Instructions (Signed)
Access Code: [email or web address] URL: [email or web address] Date: 03/08/2020 Prepared by: Dwana Curd  Exercises Supine Lower Trunk Rotation - 1 x daily - 7 x weekly - 10 reps - 1 sets - 5 sec hold Supine Butterfly Groin Stretch - 1 x daily - 7 x weekly - 3 reps - 1 sets - 30 sec hold Supine Figure 4 Piriformis Stretch - 1 x daily - 7 x weekly - 3 reps - 1 sets - 30 sec hold Hook Lying Single Knee to Chest Stretch with Towel - 1 x daily - 7 x weekly - 3 sets - 10 reps Seated Flexion Stretch - 1 x daily - 7 x weekly - 3 sets - 10 reps Standing Lumbar Spine Flexion Stretch Counter - 1 x daily - 7 x weekly - 5 reps - 1 sets - 10 hold
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
## 2020-03-09 ENCOUNTER — Ambulatory Visit: Payer: No Typology Code available for payment source | Admitting: Physical Therapy

## 2020-03-11 DIAGNOSIS — IMO0002 Reserved for concepts with insufficient information to code with codable children: Secondary | ICD-10-CM | POA: Insufficient documentation

## 2020-03-15 ENCOUNTER — Other Ambulatory Visit: Payer: Self-pay

## 2020-03-15 ENCOUNTER — Ambulatory Visit (INDEPENDENT_AMBULATORY_CARE_PROVIDER_SITE_OTHER): Payer: No Typology Code available for payment source

## 2020-03-15 ENCOUNTER — Ambulatory Visit (INDEPENDENT_AMBULATORY_CARE_PROVIDER_SITE_OTHER): Payer: No Typology Code available for payment source | Admitting: Podiatry

## 2020-03-15 ENCOUNTER — Other Ambulatory Visit: Payer: Self-pay | Admitting: Podiatry

## 2020-03-15 DIAGNOSIS — M7752 Other enthesopathy of left foot: Secondary | ICD-10-CM

## 2020-03-15 DIAGNOSIS — M778 Other enthesopathies, not elsewhere classified: Secondary | ICD-10-CM | POA: Diagnosis not present

## 2020-03-15 DIAGNOSIS — M7671 Peroneal tendinitis, right leg: Secondary | ICD-10-CM

## 2020-03-15 MED ORDER — MELOXICAM 15 MG PO TABS: 15.0000 mg | ORAL_TABLET | Freq: Every day | ORAL | 0 refills | Status: DC
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
## 2020-03-16 ENCOUNTER — Encounter: Payer: Non-veteran care | Admitting: Physical Therapy

## 2020-03-17 ENCOUNTER — Encounter: Payer: Non-veteran care | Admitting: Physical Therapy

## 2020-03-18 ENCOUNTER — Other Ambulatory Visit: Payer: Self-pay

## 2020-03-18 ENCOUNTER — Telehealth: Payer: Self-pay

## 2020-03-18 ENCOUNTER — Other Ambulatory Visit: Payer: Non-veteran care

## 2020-03-18 NOTE — Telephone Encounter (Signed)
-----   Message from Waynard Reeds, RN sent at 03/18/2020 10:20 AM EDT ----- Regarding: VA authorization This pt needs an MRI of the right ankle wo contrast. Diagnosis Peroneal tendinitis, right .

## 2020-03-18 NOTE — Telephone Encounter (Signed)
VA authorization # [id number]  From 02/16/2020 to 09/11/2020   See media tab

## 2020-03-18 NOTE — Progress Notes (Signed)
Error

## 2020-03-22 ENCOUNTER — Ambulatory Visit
Payer: No Typology Code available for payment source | Attending: Obstetrics and Gynecology | Admitting: Physical Therapy

## 2020-03-22 ENCOUNTER — Other Ambulatory Visit: Payer: Self-pay | Admitting: Student

## 2020-03-22 ENCOUNTER — Encounter: Payer: Self-pay | Admitting: Physical Therapy

## 2020-03-22 ENCOUNTER — Other Ambulatory Visit: Payer: Self-pay

## 2020-03-22 DIAGNOSIS — R279 Unspecified lack of coordination: Secondary | ICD-10-CM | POA: Insufficient documentation

## 2020-03-22 DIAGNOSIS — M62838 Other muscle spasm: Secondary | ICD-10-CM

## 2020-03-22 DIAGNOSIS — R293 Abnormal posture: Secondary | ICD-10-CM

## 2020-03-22 DIAGNOSIS — Q613 Polycystic kidney, unspecified: Secondary | ICD-10-CM

## 2020-03-22 DIAGNOSIS — N281 Cyst of kidney, acquired: Secondary | ICD-10-CM

## 2020-03-22 NOTE — Therapy (Signed)
[facility] [address] [place], [place], [address] Phone: [phone]   Fax:  [phone]  Physical Therapy Treatment  Patient Details  Name: Carolyn Gregory MRN: [id number] Date of Birth: September 26, 1960 Referring Provider (PT): Deans, Harley Alto, MD   Encounter Date: 03/22/2020   PT End of Session - 03/22/20 1022    Visit Number 3    Number of Visits 15    Date for PT Re-Evaluation 05/25/20    Authorization Type recert submitted to Dr. August Saucer resubmitted 2/20.      11/15 visits authorized visits through 09/26/18    PT Start Time 1018    PT Stop Time 1058    PT Time Calculation (min) 40 min    Activity Tolerance Patient tolerated treatment well    Behavior During Therapy [facility] for tasks assessed/performed           Past Medical History:  Diagnosis Date  . Anxiety   . Back pain   . Depression   . Hypertension     Past Surgical History:  Procedure Laterality Date  . CHOLECYSTECTOMY    . MOHS SURGERY     to remove benign growth  located in the L low abdomen  . R ankle surgery   2016   to repair lateral tendon   . VAGINAL HYSTERECTOMY      There were no vitals filed for this visit.   Subjective Assessment - 03/22/20 1036    Subjective Pt states she had some back and knee issues from the stretches and not sure if it was good pain.  Pt states everything else is about the same with urge and leakage.  Feels tight    Patient Stated Goals decrease leakage and numbness in the back of the legs; be able to relax and empty completely    Currently in Pain? Yes    Pain Score 4    with movement   Pain Location Back    Pain Orientation Left;Lower    Multiple Pain Sites No                             [facility] Adult PT Treatment/Exercise - 03/22/20 0001      Self-Care   Other Self-Care Comments  reviewed entired HEP and took out stretches that seem to be too irritating to the low back      Manual  Therapy   Manual therapy comments pt identity confirmed and informed consent given to treat internal soft tissue    Internal Pelvic Floor stretch in all directions to bublo, TP and vaginal canal fascia                       PT Long Term Goals - 03/07/20 0001      PT LONG TERM GOAL #1   Title Pt will report at least 50% reduced urgency    Time 12    Period Weeks    Status New    Target Date 05/25/20      PT LONG TERM GOAL #2   Title Pt will be able to feel she can completely empty her bladder at least 3/4 or 75% of the time    Time 12    Period Weeks    Status New    Target Date 05/25/20      PT LONG TERM GOAL #3   Title Pt will be able  to demonstrate bulging of the pelvic floor muscles due to improved ability to relax making her able to toilet without straining    Time 12    Period Weeks    Status New    Target Date 05/25/20      PT LONG TERM GOAL #4   Title Pt will be able to bend fwd to pick up things off the floor without leakage    Time 12    Period Weeks    Status New    Target Date 05/25/20                 Plan - 03/22/20 1104    Clinical Impression Statement Pt tolerated today's treatment well.  Only the 2nd treatment today and used some time to review the expectations of PT and how to guage the amount to push through the stretches . pt has a good understanding of what to expect and has an understanding that she has difficutly knowing if something uncomfortable due to stretch or due to pain.  PT is recommended to continue to work on impairments, motor coordination and awareness and soft tissue elongation.    PT Treatment/Interventions Functional mobility training;Stair training;Gait training;Patient/family education;Aquatic Therapy;Therapeutic activities;Manual lymph drainage;Moist Heat;Neuromuscular re-education;Therapeutic exercise;Manual techniques;Taping;Energy conservation;ADLs/Self Care Home Management;Electrical Stimulation    PT Next Visit  Plan f/u on HEP; for contract relax    PT Home Exercise Plan Access Code: [email or web address]    Consulted and Agree with Plan of Care Patient           Patient will benefit from skilled therapeutic intervention in order to improve the following deficits and impairments:  Abnormal gait, Pain, Increased fascial restricitons, Decreased strength, Decreased scar mobility, Increased muscle spasms, Difficulty walking, Decreased mobility, Decreased range of motion, Decreased balance, Decreased safety awareness, Decreased coordination, Postural dysfunction, Improper body mechanics, Decreased endurance  Visit Diagnosis: Other muscle spasm  Abnormal posture  Unspecified lack of coordination     Problem List Patient Active Problem List   Diagnosis Date Noted  . Tear of medial cartilage or meniscus of knee, current 03/11/2020    Junious Silk, PT 03/22/2020, 11:08 AM  [facility] [address] [place], [place], [address] Phone: [phone]   Fax:  [phone]  Name: Carolyn Gregory MRN: [id number] Date of Birth: [birth year]

## 2020-03-27 ENCOUNTER — Ambulatory Visit
Admission: RE | Admit: 2020-03-27 | Discharge: 2020-03-27 | Disposition: A | Discharge status: Home / Self Care | Payer: No Typology Code available for payment source | Source: Ambulatory Visit | Attending: Student | Admitting: Student

## 2020-03-27 ENCOUNTER — Ambulatory Visit
Admission: RE | Admit: 2020-03-27 | Discharge: 2020-03-27 | Disposition: A | Discharge status: Home / Self Care | Payer: No Typology Code available for payment source | Source: Ambulatory Visit | Attending: Podiatry | Admitting: Podiatry

## 2020-03-27 ENCOUNTER — Other Ambulatory Visit: Payer: Self-pay

## 2020-03-27 DIAGNOSIS — M7671 Peroneal tendinitis, right leg: Secondary | ICD-10-CM | POA: Diagnosis present

## 2020-03-27 DIAGNOSIS — Q613 Polycystic kidney, unspecified: Secondary | ICD-10-CM | POA: Diagnosis present

## 2020-03-27 DIAGNOSIS — N281 Cyst of kidney, acquired: Secondary | ICD-10-CM | POA: Insufficient documentation

## 2020-03-28 ENCOUNTER — Other Ambulatory Visit: Payer: Self-pay | Admitting: Surgery

## 2020-03-28 DIAGNOSIS — S83231A Complex tear of medial meniscus, current injury, right knee, initial encounter: Secondary | ICD-10-CM

## 2020-03-28 DIAGNOSIS — S83232A Complex tear of medial meniscus, current injury, left knee, initial encounter: Secondary | ICD-10-CM

## 2020-03-29 ENCOUNTER — Other Ambulatory Visit: Payer: Self-pay

## 2020-03-29 ENCOUNTER — Ambulatory Visit: Payer: No Typology Code available for payment source | Admitting: Physical Therapy

## 2020-03-29 ENCOUNTER — Encounter: Payer: Self-pay | Admitting: Physical Therapy

## 2020-03-29 DIAGNOSIS — M62838 Other muscle spasm: Secondary | ICD-10-CM

## 2020-03-29 DIAGNOSIS — R279 Unspecified lack of coordination: Secondary | ICD-10-CM

## 2020-03-29 DIAGNOSIS — R293 Abnormal posture: Secondary | ICD-10-CM

## 2020-03-29 NOTE — Therapy (Signed)
[facility] [address], [address] [place], [place], [address] Phone: [phone]   Fax:  [phone]  Physical Therapy Treatment  Patient Details  Name: Carolyn Gregory MRN: [id number] Date of Birth: 1960/08/11 Referring Provider (PT): Deans, Harley Alto, MD   Encounter Date: 03/29/2020   PT End of Session - 03/29/20 1501    Visit Number 4    Number of Visits 15    Date for PT Re-Evaluation 05/25/20    Authorization Type recert submitted to Dr. August Saucer resubmitted 2/20.      11/15 visits authorized visits through 09/26/18    PT Start Time 1447    PT Stop Time 1530    PT Time Calculation (min) 43 min    Activity Tolerance Patient tolerated treatment well    Behavior During Therapy [facility] for tasks assessed/performed           Past Medical History:  Diagnosis Date  . Anxiety   . Back pain   . Depression   . Hypertension     Past Surgical History:  Procedure Laterality Date  . CHOLECYSTECTOMY    . MOHS SURGERY     to remove benign growth  located in the L low abdomen  . R ankle surgery   2016   to repair lateral tendon   . VAGINAL HYSTERECTOMY      There were no vitals filed for this visit.   Subjective Assessment - 03/29/20 1452    Subjective Pt states the hip rotation of butterfly stretch hurts the knee.  Pt states in the morning and other times going to the rest room I am still getting the seat wet.  I have experienced the urgency went away    Patient Stated Goals decrease leakage and numbness in the back of the legs; be able to relax and empty completely                             [facility] Adult PT Treatment/Exercise - 03/29/20 0001      Self-Care   Other Self-Care Comments  reviewed urge techniques      Neuro Re-ed    Neuro Re-ed Details  contrac and relax - able to do one contract and relax afterwards      Manual Therapy   Manual Therapy Soft tissue mobilization;Internal Pelvic Floor     Manual therapy comments pt identity confirmed and informed consent given to treat internal soft tissue    Soft tissue mobilization adductors and quads    Internal Pelvic Floor stretch in all directions to bublo, TP and vaginal canal fascia less tension throughout and able to stay relaxed                       PT Long Term Goals - 03/07/20 0001      PT LONG TERM GOAL #1   Title Pt will report at least 50% reduced urgency    Time 12    Period Weeks    Status New    Target Date 05/25/20      PT LONG TERM GOAL #2   Title Pt will be able to feel she can completely empty her bladder at least 3/4 or 75% of the time    Time 12    Period Weeks    Status New    Target Date 05/25/20      PT LONG TERM GOAL #3  Title Pt will be able to demonstrate bulging of the pelvic floor muscles due to improved ability to relax making her able to toilet without straining    Time 12    Period Weeks    Status New    Target Date 05/25/20      PT LONG TERM GOAL #4   Title Pt will be able to bend fwd to pick up things off the floor without leakage    Time 12    Period Weeks    Status New    Target Date 05/25/20                 Plan - 03/29/20 1547    Clinical Impression Statement Pt did well with STM on LE prior to internal.  Pt had no pain and remained relaxed throughout treatment.  Pt had several successes with improving/reducing the urge but only several throughout the week.  This may take extra time due to time since onset.  Pt was able to contract pelvic floor and relax after without pain.  Pt will benefit from skilled PT to continue to work on muscle endurance and coordination.    PT Treatment/Interventions Functional mobility training;Stair training;Gait training;Patient/family education;Aquatic Therapy;Therapeutic activities;Manual lymph drainage;Moist Heat;Neuromuscular re-education;Therapeutic exercise;Manual techniques;Taping;Energy conservation;ADLs/Self Care Home  Management;Electrical Stimulation    PT Next Visit Plan begin working on kegel exercises and core strength at basic level; LE STM adductors and quads as needed, maybe add glutes    PT Home Exercise Plan Access Code: [email or web address]    Consulted and Agree with Plan of Care Patient           Patient will benefit from skilled therapeutic intervention in order to improve the following deficits and impairments:  Abnormal gait, Pain, Increased fascial restricitons, Decreased strength, Decreased scar mobility, Increased muscle spasms, Difficulty walking, Decreased mobility, Decreased range of motion, Decreased balance, Decreased safety awareness, Decreased coordination, Postural dysfunction, Improper body mechanics, Decreased endurance  Visit Diagnosis: Other muscle spasm  Abnormal posture  Unspecified lack of coordination     Problem List Patient Active Problem List   Diagnosis Date Noted  . Tear of medial cartilage or meniscus of knee, current 03/11/2020    Junious Silk, PT 03/29/2020, 3:59 PM  [facility] [address], [address] [place], [place], [address] Phone: [phone]   Fax:  [phone]  Name: Carolyn Gregory MRN: [id number] Date of Birth: 02-06-1961

## 2020-03-30 ENCOUNTER — Ambulatory Visit: Payer: Non-veteran care

## 2020-04-01 ENCOUNTER — Ambulatory Visit: Admission: RE | Admit: 2020-04-01 | Payer: No Typology Code available for payment source | Source: Ambulatory Visit

## 2020-04-05 ENCOUNTER — Ambulatory Visit: Payer: No Typology Code available for payment source | Admitting: Physical Therapy

## 2020-04-05 ENCOUNTER — Other Ambulatory Visit: Payer: Self-pay

## 2020-04-05 DIAGNOSIS — R293 Abnormal posture: Secondary | ICD-10-CM

## 2020-04-05 DIAGNOSIS — R279 Unspecified lack of coordination: Secondary | ICD-10-CM

## 2020-04-05 DIAGNOSIS — M62838 Other muscle spasm: Secondary | ICD-10-CM

## 2020-04-05 NOTE — Therapy (Signed)
[facility] [address] [place], [place], [address] Phone: [phone]   Fax:  [phone]  Physical Therapy Treatment  Patient Details  Name: Carolyn Gregory MRN: [id number] Date of Birth: 03-12-1961 Referring Provider (PT): Deans, Harley Alto, MD   Encounter Date: 04/05/2020   PT End of Session - 04/05/20 1454    Visit Number 5    Number of Visits 15    Date for PT Re-Evaluation 05/25/20    Authorization Type recert submitted to Dr. August Saucer resubmitted 2/20.      11/15 visits authorized visits through 09/26/18    PT Start Time 1449    PT Stop Time 1532    PT Time Calculation (min) 43 min    Activity Tolerance Patient tolerated treatment well    Behavior During Therapy [facility] for tasks assessed/performed           Past Medical History:  Diagnosis Date   Anxiety    Back pain    Depression    Hypertension     Past Surgical History:  Procedure Laterality Date   CHOLECYSTECTOMY     MOHS SURGERY     to remove benign growth  located in the L low abdomen   R ankle surgery   2016   to repair lateral tendon    VAGINAL HYSTERECTOMY      There were no vitals filed for this visit.   Subjective Assessment - 04/05/20 1451    Subjective Pt states things are better and the stretches are helping    Patient Stated Goals decrease leakage and numbness in the back of the legs; be able to relax and empty completely    Currently in Pain? No/denies                             [facility] Adult PT Treatment/Exercise - 04/05/20 0001      Lumbar Exercises: Standing   Other Standing Lumbar Exercises isometric TrA with shoulder flexion - yellow - 10x    Other Standing Lumbar Exercises pelvic tilts - 10x      Lumbar Exercises: Supine   Other Supine Lumbar Exercises large keloid scar Lt lower abdomen      Manual Therapy   Manual Therapy Soft tissue mobilization;Myofascial release    Soft tissue  mobilization bilat quad and adductors; Lt LE gluteals    Myofascial Release urinary/bladder fascial release; large keloid scar Lt lower abdomen                       PT Long Term Goals - 04/05/20 0001      PT LONG TERM GOAL #1   Title Pt will report at least 50% reduced urgency    Baseline 40%    Status On-going      PT LONG TERM GOAL #2   Title Pt will be able to feel she can completely empty her bladder at least 3/4 or 75% of the time    Status On-going                 Plan - 04/05/20 1559    Clinical Impression Statement Pt has improved 40% with urge and overall feeling less pain. Pt had good release in abdomen with fascial release techniques in all 6 planes.  Pt was able to begin core strength today.  Continue skilled PT to progress core and for fascial release.  PT Treatment/Interventions Functional mobility training;Stair training;Gait training;Patient/family education;Aquatic Therapy;Therapeutic activities;Manual lymph drainage;Moist Heat;Neuromuscular re-education;Therapeutic exercise;Manual techniques;Taping;Energy conservation;ADLs/Self Care Home Management;Electrical Stimulation    PT Next Visit Plan f/u on core exercise; internal STM and fascial release to abdomen as needed; continue to progress core strength    PT Home Exercise Plan Access Code: [email or web address]    Consulted and Agree with Plan of Care Patient           Patient will benefit from skilled therapeutic intervention in order to improve the following deficits and impairments:  Abnormal gait, Pain, Increased fascial restricitons, Decreased strength, Decreased scar mobility, Increased muscle spasms, Difficulty walking, Decreased mobility, Decreased range of motion, Decreased balance, Decreased safety awareness, Decreased coordination, Postural dysfunction, Improper body mechanics, Decreased endurance  Visit Diagnosis: Other muscle spasm  Abnormal posture  Unspecified lack of  coordination     Problem List Patient Active Problem List   Diagnosis Date Noted   Tear of medial cartilage or meniscus of knee, current 03/11/2020    Carolyn Gregory, PT 04/05/2020, 4:09 PM  [facility] [address] [place], [place], [address] Phone: [phone]   Fax:  [phone]  Name: Carolyn Gregory MRN: [id number] Date of Birth: [birth year]

## 2020-04-12 ENCOUNTER — Ambulatory Visit
Payer: No Typology Code available for payment source | Attending: Obstetrics and Gynecology | Admitting: Physical Therapy

## 2020-04-12 ENCOUNTER — Other Ambulatory Visit: Payer: Self-pay

## 2020-04-12 DIAGNOSIS — M62838 Other muscle spasm: Secondary | ICD-10-CM | POA: Diagnosis present

## 2020-04-12 DIAGNOSIS — R279 Unspecified lack of coordination: Secondary | ICD-10-CM | POA: Diagnosis present

## 2020-04-12 DIAGNOSIS — R293 Abnormal posture: Secondary | ICD-10-CM | POA: Insufficient documentation
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
## 2020-04-13 ENCOUNTER — Ambulatory Visit: Payer: No Typology Code available for payment source

## 2020-04-15 ENCOUNTER — Ambulatory Visit: Payer: No Typology Code available for payment source | Admitting: Physical Therapy

## 2020-04-26 ENCOUNTER — Ambulatory Visit: Payer: No Typology Code available for payment source | Admitting: Physical Therapy

## 2020-04-26 ENCOUNTER — Encounter: Payer: Self-pay | Admitting: Physical Therapy

## 2020-04-26 ENCOUNTER — Other Ambulatory Visit: Payer: Self-pay

## 2020-04-26 ENCOUNTER — Ambulatory Visit (INDEPENDENT_AMBULATORY_CARE_PROVIDER_SITE_OTHER): Payer: No Typology Code available for payment source | Admitting: Podiatry

## 2020-04-26 DIAGNOSIS — M7671 Peroneal tendinitis, right leg: Secondary | ICD-10-CM

## 2020-04-26 DIAGNOSIS — M62838 Other muscle spasm: Secondary | ICD-10-CM

## 2020-04-26 DIAGNOSIS — M778 Other enthesopathies, not elsewhere classified: Secondary | ICD-10-CM

## 2020-04-26 DIAGNOSIS — R279 Unspecified lack of coordination: Secondary | ICD-10-CM

## 2020-04-26 DIAGNOSIS — R293 Abnormal posture: Secondary | ICD-10-CM

## 2020-04-26 DIAGNOSIS — M722 Plantar fascial fibromatosis: Secondary | ICD-10-CM | POA: Diagnosis not present

## 2020-04-26 MED ORDER — MELOXICAM 15 MG PO TABS
15.0000 mg | ORAL_TABLET | Freq: Every day | ORAL | 0 refills | Status: DC
Start: 2020-04-26 — End: 2021-02-21

## 2020-04-26 NOTE — Therapy (Signed)
[facility] [address] [place], [place], [address] Phone: [phone]   Fax:  [phone]  Physical Therapy Treatment  Patient Details  Name: Carolyn Gregory MRN: [id number] Date of Birth: 12-19-1960 Referring Provider (PT): Deans, Harley Alto, MD   Encounter Date: 04/26/2020   PT End of Session - 04/26/20 1220    Visit Number 7    Date for PT Re-Evaluation 05/25/20    Authorization Type recert submitted to Dr. August Saucer resubmitted 2/20.      11/15 visits authorized visits through 09/26/18    PT Start Time 0933    PT Stop Time 1015    PT Time Calculation (min) 42 min    Activity Tolerance Patient tolerated treatment well    Behavior During Therapy [facility] for tasks assessed/performed           Past Medical History:  Diagnosis Date   Anxiety    Back pain    Depression    Hypertension     Past Surgical History:  Procedure Laterality Date   CHOLECYSTECTOMY     MOHS SURGERY     to remove benign growth  located in the L low abdomen   R ankle surgery   2016   to repair lateral tendon    VAGINAL HYSTERECTOMY      There were no vitals filed for this visit.   Subjective Assessment - 04/26/20 0936    Subjective My knee and back of calf is still bothering me.  I try to wait as long as possible.                             [facility] Adult PT Treatment/Exercise - 04/26/20 0001      Manual Therapy   Manual therapy comments pt identity confirmed and informed consent given to treat internal soft tissue    Myofascial Release urethra and vaginal canal; urethra mobs one hand internal and one external to mobilize bladder and urethra; abdominal fascial release to bladder externally; scar mobs and cupping to very adhesive scar tissue on lower left abdomen                       PT Long Term Goals - 04/05/20 0001      PT LONG TERM GOAL #1   Title Pt will report at least 50% reduced  urgency    Baseline 40%    Status On-going      PT LONG TERM GOAL #2   Title Pt will be able to feel she can completely empty her bladder at least 3/4 or 75% of the time    Status On-going                 Plan - 04/26/20 1221    Clinical Impression Statement Pt responded well to [facility] and had decreased tenderness with fascial release internally.  Pt has very tight and thick keloid scar in lower left abdomen and was able to tolerate scar massage and cupping.  Educated pt on doing scar massage as part of normal routine to avoid increased fascial adhesions from that.    PT Treatment/Interventions Functional mobility training;Stair training;Gait training;Patient/family education;Aquatic Therapy;Therapeutic activities;Manual lymph drainage;Moist Heat;Neuromuscular re-education;Therapeutic exercise;Manual techniques;Taping;Energy conservation;ADLs/Self Care Home Management;Electrical Stimulation    PT Next Visit Plan f/u on internal bladder release and scar tissue release; lumbar and thoracic release try in supine with one hand on ant  and one posterior; try lying on heat if tolerated; core strength and abdominal fascial release    PT Home Exercise Plan Access Code: [email or web address]    Consulted and Agree with Plan of Care Patient           Patient will benefit from skilled therapeutic intervention in order to improve the following deficits and impairments:  Abnormal gait, Pain, Increased fascial restricitons, Decreased strength, Decreased scar mobility, Increased muscle spasms, Difficulty walking, Decreased mobility, Decreased range of motion, Decreased balance, Decreased safety awareness, Decreased coordination, Postural dysfunction, Improper body mechanics, Decreased endurance  Visit Diagnosis: Other muscle spasm  Abnormal posture  Unspecified lack of coordination     Problem List Patient Active Problem List   Diagnosis Date Noted   Tear of medial cartilage or meniscus of knee, current  03/11/2020    Junious Silk, PT 04/26/2020, 12:26 PM  [facility] [address], [address] [place], [place], [address] Phone: [phone]   Fax:  [phone]  Name: Carolyn Gregory MRN: [id number] Date of Birth: [birth year]

## 2020-04-26 NOTE — Progress Notes (Signed)
   HPI: 59 y.o. female presenting today as a new patient for evaluation of right ankle pain as well as left foot pain. In regards to the right ankle, the patient has a history of peroneal tenodesis surgery 2016. Patient has experienced chronic pain and tenderness ever since. She notices that she gets intermittent cramping throughout the lateral aspect of the right leg after surgery. Surgery was performed at the [facility].  Also regarding the left foot. The patient states that approximately 2 years ago in 2019 she hit the top of her left foot against a magazine rack. She has had pain and tenderness to the dorsal aspect of the left foot ever since. She currently has not done anything for treatment. She presents for further treatment and evaluation   Past Medical History:  Diagnosis Date  . Anxiety   . Back pain   . Depression   . Hypertension      Physical Exam: General: The patient is alert and oriented x3 in no acute distress.  Dermatology: Skin is warm, dry and supple bilateral lower extremities. Negative for open lesions or macerations.  Vascular: Palpable pedal pulses bilaterally. No edema or erythema noted. Capillary refill within normal limits.  Neurological: Epicritic and protective threshold grossly intact bilaterally.   Musculoskeletal Exam: Range of motion within normal limits to all pedal and ankle joints bilateral. Muscle strength 5/5 in all groups bilateral. Pain on palpation along the tarsometatarsal joint left foot. There is also some tenderness palpation with intermittent cramping of the peroneal muscle bellies right leg  Radiographic Exam:  Normal osseous mineralization. Joint spaces preserved. No fracture/dislocation/boney destruction.    Assessment: 1. H/o peroneal tenodesis surgery right. 2016 Two. Chronic peroneal tendinitis right Three. Left foot capsulitis   Plan of Care:  1. Patient evaluated.  2. Injection of 0.5 cc Celestone Soluspan injected into the left  midfoot 3.  Prescription for custom molded orthotics printed to take to the [place].  Continue custom molded orthotics from the [place]. Patient gets new orthotics annually.  Prescription was also provided for supportive sneakers 2 pair annually. 4. Order placed again today for physical therapy at [facility]. patient states that she lost the last order.  Patient has had prior physical therapy done before however she was dissatisfied with the treatment she received 5.  Meloxicam 15 mg daily reordered today.  Patient did not take the prescription last visit because she was concerned about authorization for the medication from the [place]. 6.  Return to clinic as needed      Felecia Shelling, DPM [facility]  Dr. Felecia Shelling, DPM    [address] [place], [place] [address]                Office [phone]  Fax [phone]

## 2020-05-03 ENCOUNTER — Other Ambulatory Visit: Payer: Self-pay

## 2020-05-03 ENCOUNTER — Ambulatory Visit: Payer: No Typology Code available for payment source | Admitting: Physical Therapy

## 2020-05-03 DIAGNOSIS — M62838 Other muscle spasm: Secondary | ICD-10-CM | POA: Diagnosis not present

## 2020-05-03 DIAGNOSIS — R293 Abnormal posture: Secondary | ICD-10-CM

## 2020-05-03 DIAGNOSIS — R279 Unspecified lack of coordination: Secondary | ICD-10-CM

## 2020-05-03 NOTE — Therapy (Signed)
[facility] [address] [place], [place], [address] Phone: [phone]   Fax:  [phone]  Physical Therapy Treatment  Patient Details  Name: Carolyn Gregory MRN: [id number] Date of Birth: 02-23-1961 Referring Provider (PT): Deans, Harley Alto, MD   Encounter Date: 05/03/2020   PT End of Session - 05/03/20 1025    Visit Number 8    Number of Visits 15    Date for PT Re-Evaluation 05/25/20    Authorization Type recert submitted to Dr. August Saucer resubmitted 2/20.      11/15 visits authorized visits through 09/26/18    PT Start Time 0934    PT Stop Time 1024    PT Time Calculation (min) 50 min    Activity Tolerance Patient tolerated treatment well    Behavior During Therapy [facility] for tasks assessed/performed           Past Medical History:  Diagnosis Date  . Anxiety   . Back pain   . Depression   . Hypertension     Past Surgical History:  Procedure Laterality Date  . CHOLECYSTECTOMY    . MOHS SURGERY     to remove benign growth  located in the L low abdomen  . R ankle surgery   2016   to repair lateral tendon   . VAGINAL HYSTERECTOMY      There were no vitals filed for this visit.   Subjective Assessment - 05/03/20 0939    Subjective Pt states the exercises she was doing with chiropractic caused some pain.  Pt states no pain or anything after PT last time.    Patient Stated Goals decrease leakage and numbness in the back of the legs; be able to relax and empty completely    Currently in Pain? No/denies                             [facility] Adult PT Treatment/Exercise - 05/03/20 0001      Lumbar Exercises: Aerobic   UBE (Upper Arm Bike) L1 3 min fwd; 1 min back - cues and edu on activation of TrA and posture      Manual Therapy   Soft tissue mobilization Lt adductors    Myofascial Release cupping, bladder MFR abdomen; Lt adductors                       PT Long Term  Goals - 04/05/20 0001      PT LONG TERM GOAL #1   Title Pt will report at least 50% reduced urgency    Baseline 40%    Status On-going      PT LONG TERM GOAL #2   Title Pt will be able to feel she can completely empty her bladder at least 3/4 or 75% of the time    Status On-going                 Plan - 05/03/20 1038    Clinical Impression Statement Pt responded well to today's treatment . Pt was able to activate core during exericse in sitting with UE movements . Pt is responding well with bladder releases and felt a change with the focus on scar tissue massage with the cupping.  Pt was given info on ordering cup online as she will benefit from ability to work on this several times/week.  Pt will benefit from skilled PT to continue working on  improved soft tissue mobilty in abdomen and pelvic floor and muscle coordination to improve bladder control.    PT Treatment/Interventions Functional mobility training;Stair training;Gait training;Patient/family education;Aquatic Therapy;Therapeutic activities;Manual lymph drainage;Moist Heat;Neuromuscular re-education;Therapeutic exercise;Manual techniques;Taping;Energy conservation;ADLs/Self Care Home Management;Electrical Stimulation    PT Next Visit Plan lying supine on heat; internal bladder release if tolerated; LE quad and adductors STM; abdominal fascial release to colon and diaphragm; core strength    PT Home Exercise Plan Access Code: [email or web address]    Consulted and Agree with Plan of Care Patient           Patient will benefit from skilled therapeutic intervention in order to improve the following deficits and impairments:  Abnormal gait, Pain, Increased fascial restricitons, Decreased strength, Decreased scar mobility, Increased muscle spasms, Difficulty walking, Decreased mobility, Decreased range of motion, Decreased balance, Decreased safety awareness, Decreased coordination, Postural dysfunction, Improper body mechanics, Decreased  endurance  Visit Diagnosis: Other muscle spasm  Abnormal posture  Unspecified lack of coordination     Problem List Patient Active Problem List   Diagnosis Date Noted  . Tear of medial cartilage or meniscus of knee, current 03/11/2020    Junious Silk, PT 05/03/2020, 10:57 AM  [facility] [address] [place], [place], [address] Phone: [phone]   Fax:  [phone]  Name: Carolyn Gregory MRN: [id number] Date of Birth: 1961-01-13

## 2020-05-10 ENCOUNTER — Encounter: Payer: Self-pay | Admitting: Physical Therapy

## 2020-05-10 ENCOUNTER — Ambulatory Visit
Payer: No Typology Code available for payment source | Attending: Obstetrics and Gynecology | Admitting: Physical Therapy

## 2020-05-10 ENCOUNTER — Other Ambulatory Visit: Payer: Self-pay

## 2020-05-10 DIAGNOSIS — R279 Unspecified lack of coordination: Secondary | ICD-10-CM | POA: Insufficient documentation

## 2020-05-10 DIAGNOSIS — R293 Abnormal posture: Secondary | ICD-10-CM

## 2020-05-10 DIAGNOSIS — M62838 Other muscle spasm: Secondary | ICD-10-CM | POA: Diagnosis not present
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
## 2020-05-17 ENCOUNTER — Other Ambulatory Visit: Payer: Self-pay

## 2020-05-17 ENCOUNTER — Ambulatory Visit: Payer: No Typology Code available for payment source | Admitting: Physical Therapy

## 2020-05-17 ENCOUNTER — Encounter: Payer: Self-pay | Admitting: Physical Therapy

## 2020-05-17 DIAGNOSIS — M62838 Other muscle spasm: Secondary | ICD-10-CM | POA: Diagnosis not present

## 2020-05-17 DIAGNOSIS — R293 Abnormal posture: Secondary | ICD-10-CM

## 2020-05-17 DIAGNOSIS — R279 Unspecified lack of coordination: Secondary | ICD-10-CM

## 2020-05-17 NOTE — Therapy (Signed)
[facility] [address] [place], [place], [address] Phone: [phone]   Fax:  [phone]  Physical Therapy Treatment  Patient Details  Name: Carolyn Gregory MRN: [id number] Date of Birth: 1961/02/11 Referring Provider (PT): Deans, Harley Alto, MD   Encounter Date: 05/17/2020   PT End of Session - 05/17/20 0936    Visit Number 10    Number of Visits 15    Date for PT Re-Evaluation 05/25/20    PT Start Time 0930    PT Stop Time 1014    PT Time Calculation (min) 44 min    Activity Tolerance Patient tolerated treatment well    Behavior During Therapy [facility] for tasks assessed/performed           Past Medical History:  Diagnosis Date  . Anxiety   . Back pain   . Depression   . Hypertension     Past Surgical History:  Procedure Laterality Date  . CHOLECYSTECTOMY    . MOHS SURGERY     to remove benign growth  located in the L low abdomen  . R ankle surgery   2016   to repair lateral tendon   . VAGINAL HYSTERECTOMY      There were no vitals filed for this visit.   Subjective Assessment - 05/17/20 0933    Subjective The knee felt better after previous session.  I have more urgency when I bend forward still.  When I go to the bathroom the last couple of days I felt excess pressure even when I didn't have a lot of urine in the bladder (just noticed in the last couple of days.    How long can you walk comfortably? can walk around the store    Patient Stated Goals decrease leakage and numbness in the back of the legs; be able to relax and empty completely    Currently in Pain? Yes    Pain Score 7     Pain Location Knee    Pain Orientation Left;Medial;Right   more in the left knee   Pain Descriptors / Indicators Sore    Aggravating Factors  knees in full ext for long periods of time, pressure around the knee    Multiple Pain Sites No                             [facility] Adult PT  Treatment/Exercise - 05/17/20 0001      Lumbar Exercises: Stretches   Other Lumbar Stretch Exercise seated adductor stretch      Manual Therapy   Manual therapy comments pt identity confirmed and informed consent given to treat internal soft tissue    Soft tissue mobilization bilat adductors    Myofascial Release ischiocavernosis, adductors, sacral mobs anterior/posterior    Internal Pelvic Floor everything done externally                       PT Long Term Goals - 05/10/20 0001      PT LONG TERM GOAL #1   Title Pt will report at least 50% reduced urgency    Baseline 40%    Status On-going      PT LONG TERM GOAL #2   Title Pt will be able to feel she can completely empty her bladder at least 3/4 or 75% of the time    Baseline 25% of the time is emptying completely  Status On-going      PT LONG TERM GOAL #3   Title Pt will be able to demonstrate bulging of the pelvic floor muscles due to improved ability to relax making her able to toilet without straining    Baseline after BM I have been having to wipe a lot; maybe not emptying all the time    Status On-going      PT LONG TERM GOAL #4   Title Pt will be able to bend fwd to pick up things off the floor without leakage    Baseline wearing liners all the time and they have urine as if I am leaking throughout the day; changing liners 3-4/day    Status On-going                 Plan - 05/17/20 1205    Clinical Impression Statement Pt did well with treatment today.  PT focused on muscle release bilat adductors and ischiocavernosis muscles externally.  PT also did some scaral release from ant/posterior.  Pt was educated on seated adductor stretches.  She continues to make slow and staedy progress with less urgency and improved bladder control.    PT Treatment/Interventions Functional mobility training;Stair training;Gait training;Patient/family education;Aquatic Therapy;Therapeutic activities;Manual lymph  drainage;Moist Heat;Neuromuscular re-education;Therapeutic exercise;Manual techniques;Taping;Energy conservation;ADLs/Self Care Home Management;Electrical Stimulation    PT Next Visit Plan begin supine on heat and STM/MFR sacrum and lower abdomen; work on breathing and bulging, PF relax meditation; sitting and bulging for improved toileting and complete emptying    PT Home Exercise Plan Access Code: [email or web address]    Consulted and Agree with Plan of Care Patient           Patient will benefit from skilled therapeutic intervention in order to improve the following deficits and impairments:  Abnormal gait, Pain, Increased fascial restricitons, Decreased strength, Decreased scar mobility, Increased muscle spasms, Difficulty walking, Decreased mobility, Decreased range of motion, Decreased balance, Decreased safety awareness, Decreased coordination, Postural dysfunction, Improper body mechanics, Decreased endurance  Visit Diagnosis: Other muscle spasm  Abnormal posture  Unspecified lack of coordination     Problem List Patient Active Problem List   Diagnosis Date Noted  . Tear of medial cartilage or meniscus of knee, current 03/11/2020    Junious Silk, PT 05/17/2020, 12:23 PM  [facility] [address] [place], [place], [address] Phone: [phone]   Fax:  [phone]  Name: Carolyn Gregory MRN: [id number] Date of Birth: 1961-04-04

## 2020-05-23 ENCOUNTER — Encounter: Payer: Self-pay | Admitting: Neurology

## 2020-05-24 ENCOUNTER — Other Ambulatory Visit: Payer: Self-pay

## 2020-05-24 ENCOUNTER — Ambulatory Visit: Payer: No Typology Code available for payment source | Admitting: Physical Therapy

## 2020-05-24 ENCOUNTER — Encounter: Payer: Self-pay | Admitting: Physical Therapy

## 2020-05-24 DIAGNOSIS — R293 Abnormal posture: Secondary | ICD-10-CM

## 2020-05-24 DIAGNOSIS — M62838 Other muscle spasm: Secondary | ICD-10-CM | POA: Diagnosis not present

## 2020-05-24 DIAGNOSIS — R279 Unspecified lack of coordination: Secondary | ICD-10-CM

## 2020-05-24 NOTE — Addendum Note (Signed)
Addended by: Dwana Curd L on: 05/24/2020 11:13 AM   Modules accepted: Orders

## 2020-05-24 NOTE — Therapy (Signed)
[facility] [address] [place], [place], [address] Phone: [phone]   Fax:  [phone]  Physical Therapy Treatment  Patient Details  Name: Carolyn Gregory MRN: [id number] Date of Birth: 1960/08/29 Referring Provider (PT): Deans, Harley Alto, MD   Encounter Date: 05/24/2020   PT End of Session - 05/24/20 1100    Visit Number 11    Date for PT Re-Evaluation 07/19/20    PT Start Time 0933    PT Stop Time 1020    PT Time Calculation (min) 47 min    Activity Tolerance Patient tolerated treatment well    Behavior During Therapy [facility] for tasks assessed/performed           Past Medical History:  Diagnosis Date  . Anxiety   . Back pain   . Depression   . Hypertension     Past Surgical History:  Procedure Laterality Date  . CHOLECYSTECTOMY    . MOHS SURGERY     to remove benign growth  located in the L low abdomen  . R ankle surgery   2016   to repair lateral tendon   . VAGINAL HYSTERECTOMY      There were no vitals filed for this visit.   Subjective Assessment - 05/24/20 0937    Subjective I felt like I could do the butterfly stretch a little better and I think all the work we are doing is helping the knee pain as well.  Pt feels that urgency is getting better and can understand the difference of when she has to go or when the urgency is not necessarily a rush.    How long can you walk comfortably? can walk around the store    Patient Stated Goals decrease leakage and numbness in the back of the legs; be able to relax and empty completely                          Pelvic Floor Special Questions - 05/24/20 0001    Pelvic Floor Internal Exam pt was explained the details of the exam and pt consented verbally without contraindications .     Exam Type Vaginal    Strength fair squeeze, definite lift   stronger after working on fully relaxing muscles   Strength # of reps --   5 quick  flicks in 10 sec   Strength # of seconds 3             [facility] Adult PT Treatment/Exercise - 05/24/20 0001      Self-Care   Other Self-Care Comments  demo of toileting with tactile cues breathing and bulging      Manual Therapy   Manual therapy comments pt identity confirmed and informed consent given to treat internal soft tissue    Internal Pelvic Floor Rt levators                       PT Long Term Goals - 05/24/20 0001      PT LONG TERM GOAL #1   Title Pt will report at least 50% reduced urgency    Baseline 40%    Status On-going    Target Date 07/19/20      PT LONG TERM GOAL #2   Title Pt will be able to feel she can completely empty her bladder at least 3/4 or 75% of the time    Baseline 50% emptying completely  Status On-going    Target Date 07/19/20      PT LONG TERM GOAL #3   Title Pt will be able to demonstrate bulging of the pelvic floor muscles due to improved ability to relax making her able to toilet without straining    Baseline BM are challenging and painful and I move from side to side; not emptying all the way    Status On-going    Target Date 07/19/20      PT LONG TERM GOAL #4   Title Pt will be able to bend fwd to pick up things off the floor without leakage    Baseline using 5 liners max; most days it is 3; bending multiple times causes leakage    Status On-going    Target Date 07/19/20                 Plan - 05/24/20 1103    Clinical Impression Statement Goals and progress re-assessed today.  Pt has made steady progress towards her functional goals established at the start of PT and even made some changes in knee pain which was originally thought unrelated.  Pt demonstrates good potential for further progress.  Pt was able to work more on muscle control today now that pain levels are more under control . Pt will definitely benefit from continued skilled PT to improve functional outcomes and bowel and bladder control.    PT  Treatment/Interventions Functional mobility training;Stair training;Gait training;Patient/family education;Aquatic Therapy;Therapeutic activities;Manual lymph drainage;Moist Heat;Neuromuscular re-education;Therapeutic exercise;Manual techniques;Taping;Energy conservation;ADLs/Self Care Home Management;Electrical Stimulation    PT Next Visit Plan f/u on toileting techniques; biofeedback as needed; re-assess strength and endurance and add exercises as tolerated; watch for low back engaging vs core and pelvic floor    PT Home Exercise Plan Access Code: [email or web address]    Consulted and Agree with Plan of Care Patient           Patient will benefit from skilled therapeutic intervention in order to improve the following deficits and impairments:  Abnormal gait, Pain, Increased fascial restricitons, Decreased strength, Decreased scar mobility, Increased muscle spasms, Difficulty walking, Decreased mobility, Decreased range of motion, Decreased balance, Decreased safety awareness, Decreased coordination, Postural dysfunction, Improper body mechanics, Decreased endurance  Visit Diagnosis: Other muscle spasm  Abnormal posture  Unspecified lack of coordination     Problem List Patient Active Problem List   Diagnosis Date Noted  . Tear of medial cartilage or meniscus of knee, current 03/11/2020    Junious Silk, PT 05/24/2020, 11:10 AM  [facility] [address] [place], [place], [address] Phone: [phone]   Fax:  [phone]  Name: Carolyn Gregory MRN: [id number] Date of Birth: July 27, 1960

## 2020-06-07 ENCOUNTER — Telehealth: Payer: Self-pay | Admitting: *Deleted

## 2020-06-07 DIAGNOSIS — M7671 Peroneal tendinitis, right leg: Secondary | ICD-10-CM

## 2020-06-07 DIAGNOSIS — M722 Plantar fascial fibromatosis: Secondary | ICD-10-CM

## 2020-06-07 DIAGNOSIS — M778 Other enthesopathies, not elsewhere classified: Secondary | ICD-10-CM

## 2020-06-07 NOTE — Telephone Encounter (Signed)
"  The wait is too long to get into [facility].  Is there any way that he can refer me to [facility] here in [place]?"

## 2020-06-08 NOTE — Telephone Encounter (Signed)
Delydia will you put in the order to [facility]. [facility] an outpatient [facility] there in [place]. Thanks!!!

## 2020-06-09 NOTE — Addendum Note (Signed)
Addended by: Enedina Finner on: 06/09/2020 11:48 AM   Modules accepted: Orders

## 2020-06-20 ENCOUNTER — Encounter (HOSPITAL_COMMUNITY): Payer: Self-pay

## 2020-06-20 ENCOUNTER — Ambulatory Visit (HOSPITAL_COMMUNITY): Payer: No Typology Code available for payment source

## 2020-06-20 ENCOUNTER — Ambulatory Visit (HOSPITAL_COMMUNITY)
Admission: RE | Admit: 2020-06-20 | Discharge: 2020-06-20 | Disposition: A | Discharge status: Home / Self Care | Payer: No Typology Code available for payment source | Source: Ambulatory Visit | Attending: Surgery | Admitting: Surgery

## 2020-06-20 DIAGNOSIS — S83232A Complex tear of medial meniscus, current injury, left knee, initial encounter: Secondary | ICD-10-CM | POA: Diagnosis not present

## 2020-06-21 ENCOUNTER — Telehealth: Payer: Self-pay | Admitting: Podiatry

## 2020-06-21 NOTE — Telephone Encounter (Signed)
Received forwarded message from [facility] nurse from [facility] prosthetic dept stating they got the orders for pts orthotics but needs to include diagnosis codes. If we could fax it to them @ [phone] and if any questions his # is [phone] ext [phone].  I have faxed the orders from pts chart and included the office note.

## 2020-06-27 ENCOUNTER — Encounter: Payer: Self-pay | Admitting: Physical Therapy

## 2020-06-27 ENCOUNTER — Other Ambulatory Visit: Payer: Self-pay

## 2020-06-27 ENCOUNTER — Ambulatory Visit
Payer: No Typology Code available for payment source | Attending: Obstetrics and Gynecology | Admitting: Physical Therapy

## 2020-06-27 DIAGNOSIS — R293 Abnormal posture: Secondary | ICD-10-CM | POA: Diagnosis present

## 2020-06-27 DIAGNOSIS — R279 Unspecified lack of coordination: Secondary | ICD-10-CM | POA: Insufficient documentation

## 2020-06-27 DIAGNOSIS — M62838 Other muscle spasm: Secondary | ICD-10-CM | POA: Insufficient documentation

## 2020-06-27 NOTE — Patient Instructions (Addendum)
° °  Tighten pelvic floor 10x with resting 5 seconds in between reps  Repeat 3-5x/day  [facility] [address] [place], [place] [address] Phone # [phone] Fax [phone]

## 2020-06-27 NOTE — Therapy (Signed)
[facility] [address], [address] [place], [place], [address] Phone: [phone]   Fax:  [phone]  Physical Therapy Treatment  Patient Details  Name: Carolyn Gregory MRN: [id number] Date of Birth: 1960/07/20 Referring Provider (PT): Deans, Harley Alto, MD   Encounter Date: 06/27/2020   PT End of Session - 06/27/20 1106    Visit Number 12    Authorization Time Period 15 visits; 06/22/20 - 10/20/20    PT Start Time 1102    PT Stop Time 1144    PT Time Calculation (min) 42 min    Activity Tolerance Patient tolerated treatment well    Behavior During Therapy [facility] for tasks assessed/performed           Past Medical History:  Diagnosis Date  . Anxiety   . Back pain   . Depression   . Hypertension     Past Surgical History:  Procedure Laterality Date  . CHOLECYSTECTOMY    . MOHS SURGERY     to remove benign growth  located in the L low abdomen  . R ankle surgery   2016   to repair lateral tendon   . VAGINAL HYSTERECTOMY      There were no vitals filed for this visit.   Subjective Assessment - 06/27/20 1108    Subjective I have been doing what I can as far as the stretches.  I don't think the urgency is as bad, it is a 4/10 if 10 is the worst.  So that seems to be getting better.  Pt states BM is still a strain and still feels like bladder is more full than it actually is.    Currently in Pain? Yes    Pain Score 6     Pain Location Knee    Pain Orientation Right;Left    Pain Descriptors / Indicators Aching    Pain Type Chronic pain    Pain Radiating Towards Lt knee is worse; both knees    Pain Onset More than a month ago    Pain Frequency Intermittent    Multiple Pain Sites No                          Pelvic Floor Special Questions - 06/27/20 0001    Pelvic Floor Internal Exam pt was explained the details of the exam and pt consented verbally without contraindications .     Exam Type  Vaginal    Strength fair squeeze, definite lift    Strength # of seconds 3    Tone higher after contracting; tight on Rt side             [facility] Adult PT Treatment/Exercise - 06/27/20 0001      Neuro Re-ed    Neuro Re-ed Details  cues for contract and relax and resting at least 5 seconds to return to baseline      Manual Therapy   Manual therapy comments pt identity confirmed and informed consent given to treat internal soft tissue    Soft tissue mobilization bilat adductors Lt >Rt    Internal Pelvic Floor Rt levators > than Lt; stretch to TP and levators bilaterally                  PT Education - 06/27/20 1146    Education Details kegel 10x with 5 second rest               PT  Long Term Goals - 06/27/20 0001      PT LONG TERM GOAL #4   Title Pt will be able to bend fwd to pick up things off the floor without leakage    Baseline I haven't noticed that has happened in the last two weeks                 Plan - 06/27/20 1146    Clinical Impression Statement Pt did well with treatment and tolerated internal soft tissue.  Pt had tenderness and tension in Rt posterior vaginal wall and fascia.  Pt had some tension in levators and transverse peroneus Rt side more than left.  Pt states she felt lower Rt quadrant was more painful as well.  Pt was re-assessed and demonstrates hold for one second and then gradually looses the contraction. She then retains elevated tone and needed cues for return to baseline.  Pt will benefit from skilled PT to continue to address strength and coordination of core and pelvic floor    PT Treatment/Interventions Functional mobility training;Stair training;Gait training;Patient/family education;Aquatic Therapy;Therapeutic activities;Manual lymph drainage;Moist Heat;Neuromuscular re-education;Therapeutic exercise;Manual techniques;Taping;Energy conservation;ADLs/Self Care Home Management;Electrical Stimulation    PT Next Visit Plan f/u on added  kegel 10x up to 5 sets per day    PT Home Exercise Plan Access Code: [email or web address]    Consulted and Agree with Plan of Care Patient           Patient will benefit from skilled therapeutic intervention in order to improve the following deficits and impairments:  Abnormal gait,Pain,Increased fascial restricitons,Decreased strength,Decreased scar mobility,Increased muscle spasms,Difficulty walking,Decreased mobility,Decreased range of motion,Decreased balance,Decreased safety awareness,Decreased coordination,Postural dysfunction,Improper body mechanics,Decreased endurance  Visit Diagnosis: Other muscle spasm  Abnormal posture  Unspecified lack of coordination     Problem List Patient Active Problem List   Diagnosis Date Noted  . Tear of medial cartilage or meniscus of knee, current 03/11/2020    Junious Silk, PT 06/27/2020, 2:08 PM  [facility] [address], [address] [place], [place], [address] Phone: [phone]   Fax:  [phone]  Name: Carolyn Gregory MRN: [id number] Date of Birth: 1960/08/19

## 2020-07-04 ENCOUNTER — Ambulatory Visit: Payer: No Typology Code available for payment source | Admitting: Physical Therapy

## 2020-07-04 NOTE — Progress Notes (Signed)
NEUROLOGY CONSULTATION NOTE  Carolyn Gregory MRN: [id number] DOB: Mar 14, 1961  Referring provider: [facility] Primary care provider: Yvette Rack, MD  Reason for consult:  headache   Subjective:  Britne Borelli. Holcomb is a 59 year old right-handed female with back pain, HTN and depression/anxiety who presents for headache.  History supplemented by referring provider's note.  She has had migraines since age 35.  They are typically a 4-5/10 non-throbbing pressure across the forehead with nausea, photophobia and phonophobia but they have changed over the past 3 years associated with elevated blood pressure and her chronic pain.  She now has had new severe headaches for past 3 to 4 weeks. a 10/10 pressure over the top of her head. It lasted for several hours.  Associated nausea, photophobia and phonophobia, black floaters in vision, dyspnea and increased sound of her tinnitus but no vomiting.  She couldn't rest.  They have been occurring 3 days a week since onset. She reports similar episode in 2019 related to stress.  Blood pressure has been elevated, reportedly 160s systolic.    Current NSAIDS/analgesics:  none Current triptans:  none Current ergotamine:  none Current anti-emetic:  none Current muscle relaxants:  none Current Antihypertensive medications:  lisinopril Current Antidepressant medications:  none Current Anticonvulsant medications:  none Current anti-CGRP:  none Current Vitamins/Herbal/Supplements:  Magnesium oxide 400mg  TID Current Antihistamines/Decongestants:  Cetirizine  Other therapy:  none Hormone/birth control:  Estradiol  Past NSAIDS/analgesics:  Codeine (side effect), ibuprofen, ketorolac, Mobic, tramadol Past abortive triptans:  none Past abortive ergotamine:  none Past muscle relaxants:  Robaxin, Flexeril Past anti-emetic:  Zofran ODT 4mg  Past antihypertensive medications:  none Past antidepressant medications:  Sertraline, Wellbutrin Past  anticonvulsant medications:  gabapentin Past anti-CGRP:  none Past vitamins/Herbal/Supplements:  none Past antihistamines/decongestants:  none Other past therapies:  none  Caffeine:  No coffee.  Tea infrequently.  No soda Diet:  Keeps hydrated.  Skips meals. No soda Exercise:  walks Depression:  yes; Anxiety:  Yes, gotten worse. Other pain:  Chronic pain in back, knee Sleep hygiene:  Trouble sleeping. Related to stress.  Family history of headache:  She is not aware.   10/12/2019 LABS:  t bili 0.3, ALT 21, AST 14 11/17/2019 LABS:  Na 144, K 4.1, CO2 30, Cr 1.1, glucose 77, WBC 6.98, HCT 39.5, HGB 12.7, PLT 277   PAST MEDICAL HISTORY: Past Medical History:  Diagnosis Date  . Anxiety   . Back pain   . Depression   . Hypertension     PAST SURGICAL HISTORY: Past Surgical History:  Procedure Laterality Date  . CHOLECYSTECTOMY    . MOHS SURGERY     to remove benign growth  located in the L low abdomen  . R ankle surgery   2016   to repair lateral tendon   . VAGINAL HYSTERECTOMY      MEDICATIONS: Current Outpatient Medications on File Prior to Visit  Medication Sig Dispense Refill  . aluminum chloride (DRYSOL) 20 % external solution APPLY SMALL AMOUNT TOPICALLY AS DIRECTED APPLY TO CLEAN UNDERARMS AT NIGHT AND WASH OFF IN THE MORNING. USE 2  NIGHTS A WEEK AND IF TOLERATING CAN INCREASE TO EVERY OTHER NIGHT. APPLY TO CLEAN UNDERARMS AT NIGHT AND WASH OFF IN THE MORNING. USE 2   NIGHTS A WEEK AND IF TOLERATING CAN INCREASE TO EVERY OTHER NIGHT.    01/17/2020 Benzoyl Peroxide 5 % lotion APPLY SMALL AMOUNT TOPICALLY AS DIRECTED APPLY TO GROIN AREA EVERY OTHER  DAY TO DAILY    . cholecalciferol (VITAMIN D) 400 units TABS tablet Take 1,000 Units by mouth.    . cyclobenzaprine (FLEXERIL) 5 MG tablet Take 1-2 tablets 3 times daily as needed (Patient not taking: Reported on 03/15/2020) 20 tablet 0  . dicyclomine (BENTYL) 20 MG tablet Take 1 tablet (20 mg total) by mouth every 6 (six) hours as  needed. (Patient not taking: Reported on 03/15/2020) 20 tablet 0  . dicyclomine (BENTYL) 20 MG tablet dicyclomine 20 mg tablet (Patient not taking: Reported on 03/15/2020)    . Dimethicone-Petrolatum 1-30 % CREA APPLY MODERATE AMOUNT TOPICALLY EVERY DAY USE AS NEEDED ON LOWER CHEST TO CONTROL RASH    . estradiol (ESTRACE) 0.1 MG/GM vaginal cream APPLY ONE GRAM INTO TOPICALLY AS DIRECTED TUESDAYS AND THURSDAYS EXTERNALLY AT BEDTIME  ONLY TO AREAS OF CONCERN    . famotidine (PEPCID) 20 MG tablet Take 1 tablet (20 mg total) by mouth 2 (two) times daily. (Patient not taking: Reported on 03/15/2020) 60 tablet 0  . fluticasone (FLONASE) 50 MCG/ACT nasal spray INSTILL 2 SPRAYS INTO EACH NOSTRIL EVERY DAY MAXIMUM 2 SPRAYS IN EACH NOSTRIL DAILY. FOR ALLERGIES (Patient not taking: Reported on 03/15/2020)    . gabapentin (NEURONTIN) 100 MG capsule Take 100 mg by mouth 3 (three) times daily. (Patient not taking: Reported on 03/15/2020)    . hydrOXYzine (ATARAX/VISTARIL) 25 MG tablet Take 25 mg by mouth 3 (three) times daily as needed. (Patient not taking: Reported on 03/15/2020)    . ibuprofen (ADVIL,MOTRIN) 600 MG tablet Take 1 tablet (600 mg total) by mouth every 6 (six) hours as needed. (Patient not taking: Reported on 03/15/2020) 30 tablet 0  . ketoconazole (NIZORAL) 2 % shampoo SHAMPOO AS DIRECTED TOPICALLY SUNDAY,THURSDAY,SATURDAY    . ketorolac (TORADOL) 10 MG tablet Take 1 tablet (10 mg total) by mouth every 6 (six) hours as needed. (Patient not taking: Reported on 03/15/2020) 20 tablet 0  . lisinopril (ZESTRIL) 5 MG tablet Take by mouth.    . meloxicam (MOBIC) 15 MG tablet Take 1 tablet (15 mg total) by mouth daily. 30 tablet 0  . methocarbamol (ROBAXIN) 750 MG tablet Take 750 mg by mouth 4 (four) times daily. (Patient not taking: Reported on 03/15/2020)    . ondansetron (ZOFRAN-ODT) 4 MG disintegrating tablet ondansetron 4 mg disintegrating tablet (Patient not taking: Reported on 03/15/2020)    . pantoprazole (PROTONIX) 40  MG tablet Take 40 mg by mouth daily. (Patient not taking: Reported on 03/15/2020)    . polyvinyl alcohol (LIQUIFILM TEARS) 1.4 % ophthalmic solution INSTILL ONE DROP IN [facility] EYE TWO TIMES A DAY FOR DRY EYES    . Pramoxine-HC 1-1 % OINT APPLY SMALL AMOUNT TOPICALLY EVERY DAY AS NEEDED TO AFFECTED AREAS.    . traMADol (ULTRAM) 50 MG tablet Take 1 tablet (50 mg total) by mouth every 6 (six) hours as needed. 12 tablet 0  . traMADol (ULTRAM) 50 MG tablet tramadol 50 mg tablet    . triamterene-hydrochlorothiazide (DYAZIDE) 37.5-25 MG capsule Take 1 capsule by mouth daily. (Patient not taking: Reported on 03/15/2020)     No current facility-administered medications on file prior to visit.    ALLERGIES: Allergies  Allergen Reactions  . Codeine Other (See Comments)  . Hctz [Hydrochlorothiazide] Other (See Comments)    Causes her potassium to drop  . Trimebutine   . Sertraline Anxiety    FAMILY HISTORY: Family History  Problem Relation Age of Onset  . Diabetes Mother  SOCIAL HISTORY: Social History   Socioeconomic History  . Marital status: Single    Spouse name: Not on file  . Number of children: Not on file  . Years of education: Not on file  . Highest education level: Not on file  Occupational History  . Not on file  Tobacco Use  . Smoking status: Never Smoker  . Smokeless tobacco: Never Used  Vaping Use  . Vaping Use: Not on file  Substance and Sexual Activity  . Alcohol use: No  . Drug use: No  . Sexual activity: Never  Other Topics Concern  . Not on file  Social History Narrative  . Not on file   Social Determinants of Health   Financial Resource Strain: Not on file  Food Insecurity: Not on file  Transportation Needs: Not on file  Physical Activity: Not on file  Stress: Not on file  Social Connections: Not on file  Intimate Partner Violence: Not on file    Objective:  Blood pressure (!) 151/94, pulse 62, height 5\' 7"  (1.702 m), weight 223 lb (101.2 kg), SpO2  98 %. General: No acute distress.  Patient appears well-groomed.   Head:  Normocephalic/atraumatic Eyes:  fundi examined but not visualized Neck: supple, no paraspinal tenderness, full range of motion Back: No paraspinal tenderness Heart: regular rate and rhythm Lungs: Clear to auscultation bilaterally. Vascular: No carotid bruits. Neurological Exam: Mental status: alert and oriented to person, place, and time, recent and remote memory intact, fund of knowledge intact, attention and concentration intact, speech fluent and not dysarthric, language intact. Cranial nerves: CN I: not tested CN II: pupils equal, round and reactive to light, visual fields intact CN III, IV, VI:  full range of motion, no nystagmus, no ptosis CN V: facial sensation intact. CN VII: upper and lower face symmetric CN VIII: hearing intact CN IX, X: gag intact, uvula midline CN XI: sternocleidomastoid and trapezius muscles intact CN XII: tongue midline Bulk & Tone: normal, no fasciculations. Motor:  muscle strength 5/5 throughout Sensation:  Pinprick, temperature and vibratory sensation intact. Deep Tendon Reflexes:  2+ throughout,  toes downgoing.   Finger to nose testing:  Without dysmetria.   Heel to shin:  Without dysmetria.   Gait:  Normal station and stride.  Romberg negative.  Assessment/Plan:   1. New onset severe headaches, likely worsening migraines but must rule out secondary etiologies 2.  Migraine without aura  1.  Check MRI/MRA of brain  2.  Start topiramate 25mg  at bedtime for one week, then increase to 50mg  at bedtime.  We can increase dose to 75mg  at bedtime in 5 weeks if needed. 3.  For rescue therapy, will have her try Ubrelvy 100mg .  Given her high blood pressure at this time and until we can rule out an aneurysm, I would like to avoid triptans 4.  Limit use of pain relievers to no more than 2 days out of week to prevent risk of rebound or medication-overuse headache. 5.  Keep headache  diary 6.  Follow up in 6 months.   Thank you for allowing me to take part in the care of this patient.  , DO  CC: , MD

## 2020-07-05 ENCOUNTER — Other Ambulatory Visit: Payer: Self-pay

## 2020-07-05 ENCOUNTER — Ambulatory Visit (INDEPENDENT_AMBULATORY_CARE_PROVIDER_SITE_OTHER): Payer: No Typology Code available for payment source | Admitting: Neurology

## 2020-07-05 ENCOUNTER — Encounter: Payer: Self-pay | Admitting: Neurology

## 2020-07-05 VITALS — BP 151/94 | HR 62 | Ht 67.0 in | Wt 223.0 lb

## 2020-07-05 DIAGNOSIS — G43009 Migraine without aura, not intractable, without status migrainosus: Secondary | ICD-10-CM

## 2020-07-05 DIAGNOSIS — R519 Headache, unspecified: Secondary | ICD-10-CM

## 2020-07-05 MED ORDER — TOPIRAMATE 25 MG PO TABS: ORAL_TABLET | ORAL | 0 refills | Status: DC

## 2020-07-05 NOTE — Patient Instructions (Addendum)
  1. Will check MRI of brain and MRA of head.  2. Start topiramate 25mg  at bedtime for one week, then increase to 50mg  at bedtime.  Contact in 5 weeks with update and we can increase dose if needed. 3. Take Ubrelvy 100mg  at earliest onset of headache.  May repeat dose once in 2 hours if needed.  Maximum 2 tablets in 24 hours.  If effective then contact me for prescription. 4. Limit use of pain relievers to no more than 2 days out of the week.  These medications include acetaminophen, NSAIDs (ibuprofen/Advil/Motrin, naproxen/Aleve, triptans (Imitrex/sumatriptan), Excedrin, and narcotics.  This will help reduce risk of rebound headaches. 5. Be aware of common food triggers:  - Caffeine:  coffee, black tea, cola, Mt. Dew  - Chocolate  - Dairy:  aged cheeses (brie, blue, cheddar, gouda, [place], provolone, [place], Swiss, etc), chocolate milk, buttermilk, sour cream, limit eggs and yogurt  - Nuts, peanut butter  - Alcohol  - Cereals/grains:  FRESH breads (fresh bagels, sourdough, doughnuts), yeast productions  - Processed/canned/aged/cured meats (pre-packaged deli meats, hotdogs)  - MSG/glutamate:  soy sauce, flavor enhancer, pickled/preserved/marinated foods  - Sweeteners:  aspartame (Equal, Nutrasweet).  Sugar and Splenda are okay  - Vegetables:  legumes (lima beans, lentils, snow peas, fava beans, pinto peans, peas, garbanzo beans), sauerkraut, onions, olives, pickles  - Fruit:  avocados, bananas, citrus fruit (orange, lemon, grapefruit), mango  - Other:  Frozen meals, macaroni and cheese 6. Routine exercise 7. Stay adequately hydrated (aim for 64 oz water daily) 8. Keep headache diary 9. Maintain proper stress management 10. Maintain proper sleep hygiene 11. Do not skip meals 12. Consider supplements:  magnesium citrate 400mg  daily, riboflavin 400mg  daily, coenzyme Q10 100mg  three times daily.

## 2020-07-22 ENCOUNTER — Ambulatory Visit (HOSPITAL_COMMUNITY): Admission: RE | Admit: 2020-07-22 | Payer: No Typology Code available for payment source | Source: Ambulatory Visit

## 2020-07-22 ENCOUNTER — Encounter: Payer: Self-pay | Admitting: Physical Therapy

## 2020-07-22 ENCOUNTER — Ambulatory Visit
Payer: No Typology Code available for payment source | Attending: Obstetrics and Gynecology | Admitting: Physical Therapy

## 2020-07-22 ENCOUNTER — Ambulatory Visit (HOSPITAL_COMMUNITY)
Admission: RE | Admit: 2020-07-22 | Discharge: 2020-07-22 | Disposition: A | Discharge status: Home / Self Care | Payer: No Typology Code available for payment source | Source: Ambulatory Visit | Attending: Neurology | Admitting: Neurology

## 2020-07-22 ENCOUNTER — Other Ambulatory Visit: Payer: Self-pay

## 2020-07-22 DIAGNOSIS — R519 Headache, unspecified: Secondary | ICD-10-CM | POA: Diagnosis not present

## 2020-07-22 DIAGNOSIS — R293 Abnormal posture: Secondary | ICD-10-CM | POA: Diagnosis present

## 2020-07-22 DIAGNOSIS — G43009 Migraine without aura, not intractable, without status migrainosus: Secondary | ICD-10-CM | POA: Diagnosis present

## 2020-07-22 DIAGNOSIS — R279 Unspecified lack of coordination: Secondary | ICD-10-CM | POA: Insufficient documentation

## 2020-07-22 DIAGNOSIS — M62838 Other muscle spasm: Secondary | ICD-10-CM | POA: Insufficient documentation

## 2020-07-22 MED ORDER — GADOBUTROL 1 MMOL/ML IV SOLN
10.0000 mL | Freq: Once | INTRAVENOUS | Status: AC | PRN
  Administered 2020-07-22: 10 mL via INTRAVENOUS
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
## 2020-07-25 NOTE — Progress Notes (Signed)
Tried calling pt, Phone not in service. 07/25/20 at 11:32

## 2020-07-26 ENCOUNTER — Ambulatory Visit: Payer: No Typology Code available for payment source | Admitting: Neurology

## 2020-07-27 ENCOUNTER — Telehealth: Payer: Self-pay

## 2020-07-27 NOTE — Telephone Encounter (Signed)
Called patient and someone picked up and was unable to hear or leave a message

## 2020-07-27 NOTE — Telephone Encounter (Signed)
She should be on topiramate 50mg  at bedtime.  If headaches are still frequent, increase to 100mg  at bedtime.  She should keep a headache diary to keep track of headache frequency.  Limit use of pain relievers to no more than 2 days out of week to prevent risk of rebound or medication-overuse headache.

## 2020-07-27 NOTE — Telephone Encounter (Signed)
Called patient and informed her of normal MRI results. Patient wanted to ask Dr. Everlena Cooper what would be the next steps for her excruciating pain. Informed patient that I will send Dr Everlena Cooper a message and call her back.

## 2020-07-28 NOTE — Telephone Encounter (Signed)
Spoke to patient and relayed message from Dr. Everlena Cooper. Patient stated that she may want a PT referral for her stiff neck that she feels may be contributing to her headaches. Patient asked if her MRI would show any reasoning's for her neck and head hurting. I informed her that she can schedule a f/u appt to discuss her results further with Dr. Everlena Cooper. Patient stated she will make a f/u. Also informed patient that if she wanted a PT referral to call us and let us know and we can ask Dr.jaffe for approval. Patient verbalized understanding

## 2020-07-29 ENCOUNTER — Telehealth: Payer: Self-pay | Admitting: Neurology

## 2020-07-29 ENCOUNTER — Other Ambulatory Visit: Payer: Self-pay

## 2020-07-29 ENCOUNTER — Ambulatory Visit: Payer: No Typology Code available for payment source | Admitting: Physical Therapy

## 2020-07-29 ENCOUNTER — Encounter: Payer: Self-pay | Admitting: Physical Therapy

## 2020-07-29 DIAGNOSIS — R279 Unspecified lack of coordination: Secondary | ICD-10-CM

## 2020-07-29 DIAGNOSIS — M62838 Other muscle spasm: Secondary | ICD-10-CM

## 2020-07-29 DIAGNOSIS — R293 Abnormal posture: Secondary | ICD-10-CM

## 2020-07-29 NOTE — Telephone Encounter (Signed)
Patient called and said Carolyn Gregory'd like to schedule PT for her neck and shoulder pain. Carolyn Gregory'd like a facility that is in network with [employer].  Also, patient is coming to see Dr. Everlena Cooper on 08/04/20 to follow up on her headache pain.

## 2020-07-29 NOTE — Telephone Encounter (Signed)
Dr. Everlena Cooper can send that referral on the day of her appt next week if he feels appropriate
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
## 2020-07-29 NOTE — Patient Instructions (Signed)
[id number]

## 2020-07-29 NOTE — Telephone Encounter (Signed)
Dr.Jaffe is out of the office until Monday. Will send a message to him to see if we could get a referral sent.     Pt advised she has to call her insurance to see who will be in network with. Once she do that I can send a referral to that provider.   Pt states she thinks she is [facility]. Pt seen one in the past. Unable to remember who it was.

## 2020-08-01 ENCOUNTER — Encounter: Payer: Self-pay | Admitting: Physical Therapy

## 2020-08-01 ENCOUNTER — Ambulatory Visit: Payer: No Typology Code available for payment source | Admitting: Physical Therapy

## 2020-08-01 ENCOUNTER — Other Ambulatory Visit: Payer: Self-pay

## 2020-08-01 DIAGNOSIS — M62838 Other muscle spasm: Secondary | ICD-10-CM | POA: Diagnosis not present

## 2020-08-01 DIAGNOSIS — R293 Abnormal posture: Secondary | ICD-10-CM

## 2020-08-01 DIAGNOSIS — R279 Unspecified lack of coordination: Secondary | ICD-10-CM

## 2020-08-01 NOTE — Patient Instructions (Signed)
Types of Fiber  There are two main types of fiber:  insoluble and soluble.  Both of these types can prevent and relieve constipation and diarrhea, although some people find one or the other to be more easily digested.  This handout details information about both types of fiber.  Insoluble Fiber       Functions of Insoluble Fiber . moves bulk through the intestines  . controls and balances the pH (acidity) in the intestines       Benefits of Insoluble Fiber . promotes regular bowel movement and prevents constipation  . removes fecal waste through colon in less time  . keeps an optimal pH in intestines to prevent microbes from producing cancer substances, therefore preventing colon cancer        Food Sources of Insoluble Fiber . whole-wheat products  . wheat bran "miller's bran" . corn bran  . flax seed or other seeds . vegetables such as green beans, broccoli, cauliflower and potato skins  . fruit skins and root vegetable skins  . popcorn . brown rice  Soluble Fiber       Functions of Soluble Fiber  . holds water in the colon to bulk and soften the stool . prolongs stomach emptying time so that sugar is released and absorbed more slowly        Benefits of Soluble Fiber . lowers total cholesterol and LDL cholesterol (the bad cholesterol) therefore reducing the risk of heart disease  . regulates blood sugar for people with diabetes       Food Sources of Soluble Fiber . oat/oat bran . dried beans and peas  . nuts  . barley  . flax seed or other seeds . fruits such as oranges, pears, peaches, and apples  . vegetables such as carrots  . psyllium husk  . prunes  [facility] [address], [address] [place], [place] [address] Phone # [phone] Fax [phone]

## 2020-08-01 NOTE — Therapy (Signed)
[facility] [address] [place], [place], [address] Phone: [phone]   Fax:  [phone]  Physical Therapy Treatment  Patient Details  Name: Carolyn Gregory MRN: [id number] Date of Birth: January 13, 1961 Referring Provider (PT): Deans, Harley Alto, MD   Encounter Date: 08/01/2020   PT End of Session - 08/01/20 1252    Visit Number 15    Date for PT Re-Evaluation 10/14/20    Authorization Time Period 15 visits; 06/22/20 - 10/20/20    PT Start Time 1237    PT Stop Time 1315    PT Time Calculation (min) 38 min    Activity Tolerance Patient tolerated treatment well    Behavior During Therapy [facility] for tasks assessed/performed           Past Medical History:  Diagnosis Date  . Anxiety   . Back pain   . Depression   . Hypertension     Past Surgical History:  Procedure Laterality Date  . CHOLECYSTECTOMY    . MOHS SURGERY     to remove benign growth  located in the L low abdomen  . R ankle surgery   2016   to repair lateral tendon   . VAGINAL HYSTERECTOMY      There were no vitals filed for this visit.   Subjective Assessment - 08/01/20 1241    Subjective My knees were very sore the next 2 days with all the exercises we did. Rt shoulder was irritated after leaning on the table.    Patient Stated Goals decrease leakage and numbness in the back of the legs; be able to relax and empty completely    Currently in Pain? Yes    Pain Score 3     Pain Location Shoulder    Pain Orientation Right    Pain Descriptors / Indicators Sore    Pain Type Chronic pain    Pain Onset More than a month ago    Pain Frequency Intermittent    Aggravating Factors  chronic shoulder pain from accident    Multiple Pain Sites No                             [facility] Adult PT Treatment/Exercise - 08/01/20 0001      Neuro Re-ed    Neuro Re-ed Details  all exercises to work on coordination of core and pelvic floor  with functional movements; standing and walking with "zipping up" done intermittently      Lumbar Exercises: Seated   Long Arc Quad on Chair Strengthening;Both;15 reps    LAQ on Chair Limitations with ball squeeze    Sit to Stand 5 reps   review of sitting and holding contraction   Other Seated Lumbar Exercises march with band around knees    Other Seated Lumbar Exercises ball squeeze; clam with green loop - 20x                       PT Long Term Goals - 08/01/20 0001      PT LONG TERM GOAL #1   Title Pt will report at least 50% reduced urgency    Baseline 40%      PT LONG TERM GOAL #2   Title Pt will be able to feel she can completely empty her bladder at least 3/4 or 75% of the time   pt states she had leakage 2x since the other  day   Status On-going      PT LONG TERM GOAL #3   Title Pt will be able to demonstrate bulging of the pelvic floor muscles due to improved ability to relax making her able to toilet without straining    Baseline BM are still challenging and painful and I move from side to side; not emptying all the way, small balls and hard stool    Status On-going      PT LONG TERM GOAL #4   Title Pt will be able to bend fwd to pick up things off the floor without leakage    Baseline panty liners 2-3/day    Status On-going      PT LONG TERM GOAL #5   Title Pt will have a BM every 3 days that feels normal due to improved muscle coordination    Status On-going                 Plan - 08/01/20 1530    Clinical Impression Statement Pt had some soreness after previous session due to issues in multiple joints.  Based on reported symptoms this was most likely delayed onset muscle soreness, however she has meniscus tears that may have been irritated as well.  Today's session focused more on open chain movements of the LE in order to work on core and pelvic floor with increased challenge but also reduces load on knee joints.  Pt appeared to tolerate well  without increased pain after today's treatment.  Pt will benefit from skilled PT to address strength and continue to progress for improved bowel and bladder function.    PT Treatment/Interventions Functional mobility training;Stair training;Gait training;Patient/family education;Aquatic Therapy;Therapeutic activities;Manual lymph drainage;Moist Heat;Neuromuscular re-education;Therapeutic exercise;Manual techniques;Taping;Energy conservation;ADLs/Self Care Home Management;Electrical Stimulation    PT Next Visit Plan f/u on exercises added previously - add hip hinge; add standing hip ex's; add standing on foam with band ex's    PT Home Exercise Plan Access Code: [email or web address]    Consulted and Agree with Plan of Care Patient           Patient will benefit from skilled therapeutic intervention in order to improve the following deficits and impairments:  Abnormal gait,Pain,Increased fascial restricitons,Decreased strength,Decreased scar mobility,Increased muscle spasms,Difficulty walking,Decreased mobility,Decreased range of motion,Decreased balance,Decreased safety awareness,Decreased coordination,Postural dysfunction,Improper body mechanics,Decreased endurance  Visit Diagnosis: Other muscle spasm  Abnormal posture  Unspecified lack of coordination     Problem List Patient Active Problem List   Diagnosis Date Noted  . Tear of medial cartilage or meniscus of knee, current 03/11/2020    Junious Silk, PT 08/01/2020, 3:37 PM  [facility] [address] [place], [place], [address] Phone: [phone]   Fax:  [phone]  Name: Carolyn Gregory MRN: [id number] Date of Birth: August 11, 1960

## 2020-08-03 NOTE — Progress Notes (Signed)
NEUROLOGY FOLLOW UP OFFICE NOTE  LORENDA GRECCO [id number]   Subjective:  Carolyn Gregory. Babich is a 60 year old right-handed female with back pain, HTN and depression/anxiety who follows up for headache.  UPDATE: MRI of brain with and without contrast and MRA of head on 07/22/2020 personally reviewed and were normal.  She was started on topiramate about a month ago.  She has been having neck pain beginning in the upper cervical region bilaterally and radiating down the neck.  Flexion of neck aggravates it.  Sometimes her left upper arm may jerk.  Current NSAIDS/analgesics:  none Current triptans:  none Current ergotamine:  none Current anti-emetic:  none Current muscle relaxants:  none Current Antihypertensive medications:  lisinopril Current Antidepressant medications:  none Current Anticonvulsant medications:  topiramate 50mg  at bedtime Current anti-CGRP:  none Current Vitamins/Herbal/Supplements:  Magnesium oxide 400mg  TID Current Antihistamines/Decongestants:  Cetirizine  Other therapy:  none Hormone/birth control:  Estradiol  Caffeine:  No coffee.  Tea infrequently.  No soda Diet:  Keeps hydrated.  Skips meals. No soda Exercise:  walks Depression:  yes; Anxiety:  Yes, gotten worse. Other pain:  Chronic pain in back, knee Sleep hygiene:  Trouble sleeping. Related to stress.   HISTORY: She has had migraines since age 71.  They are typically a 4-5/10 non-throbbing pressure across the forehead with nausea, photophobia and phonophobia but they have changed over the past 3 years associated with elevated blood pressure and her chronic pain.  She developed new severe headaches in December 2021,. a 10/10 pressure over the top of her head. It lasted for several hours.  Associated nausea, photophobia and phonophobia, black floaters in vision, dyspnea and increased sound of her tinnitus but no vomiting.  She couldn't rest.  They have been occurring 3 days a week since onset. She  reports similar episode in 2019 related to stress.  Blood pressure has been elevated, reportedly 160s systolic.    Past NSAIDS/analgesics:  Codeine (side effect), ibuprofen, ketorolac, Mobic, tramadol Past abortive triptans:  none Past abortive ergotamine:  none Past muscle relaxants:  Robaxin, Flexeril Past anti-emetic:  Zofran ODT 4mg  Past antihypertensive medications:  none Past antidepressant medications:  Sertraline, Wellbutrin Past anticonvulsant medications:  gabapentin Past anti-CGRP:  none Past vitamins/Herbal/Supplements:  none Past antihistamines/decongestants:  none Other past therapies:  none   Family history of headache:  She is not aware.  PAST MEDICAL HISTORY: Past Medical History:  Diagnosis Date  . Anxiety   . Back pain   . Depression   . Hypertension     MEDICATIONS: Current Outpatient Medications on File Prior to Visit  Medication Sig Dispense Refill  . aluminum chloride (DRYSOL) 20 % external solution APPLY SMALL AMOUNT TOPICALLY AS DIRECTED APPLY TO CLEAN UNDERARMS AT NIGHT AND WASH OFF IN THE MORNING. USE 2  NIGHTS A WEEK AND IF TOLERATING CAN INCREASE TO EVERY OTHER NIGHT. APPLY TO CLEAN UNDERARMS AT NIGHT AND WASH OFF IN THE MORNING. USE 2   NIGHTS A WEEK AND IF TOLERATING CAN INCREASE TO EVERY OTHER NIGHT.    January 2022 Benzoyl Peroxide 5 % lotion APPLY SMALL AMOUNT TOPICALLY AS DIRECTED APPLY TO GROIN AREA EVERY OTHER DAY TO DAILY    . cholecalciferol (VITAMIN D) 400 units TABS tablet Take 1,000 Units by mouth.    . cyclobenzaprine (FLEXERIL) 5 MG tablet Take 1-2 tablets 3 times daily as needed (Patient not taking: No sig reported) 20 tablet 0  . dicyclomine (BENTYL) 20 MG tablet Take 1 tablet (  20 mg total) by mouth every 6 (six) hours as needed. (Patient not taking: No sig reported) 20 tablet 0  . dicyclomine (BENTYL) 20 MG tablet dicyclomine 20 mg tablet (Patient not taking: No sig reported)    . Dimethicone-Petrolatum 1-30 % CREA APPLY MODERATE AMOUNT  TOPICALLY EVERY DAY USE AS NEEDED ON LOWER CHEST TO CONTROL RASH    . estradiol (ESTRACE) 0.1 MG/GM vaginal cream APPLY ONE GRAM INTO TOPICALLY AS DIRECTED TUESDAYS AND THURSDAYS EXTERNALLY AT BEDTIME  ONLY TO AREAS OF CONCERN    . famotidine (PEPCID) 20 MG tablet Take 1 tablet (20 mg total) by mouth 2 (two) times daily. (Patient not taking: No sig reported) 60 tablet 0  . fluticasone (FLONASE) 50 MCG/ACT nasal spray INSTILL 2 SPRAYS INTO EACH NOSTRIL EVERY DAY MAXIMUM 2 SPRAYS IN EACH NOSTRIL DAILY. FOR ALLERGIES (Patient not taking: No sig reported)    . gabapentin (NEURONTIN) 100 MG capsule Take 100 mg by mouth 3 (three) times daily. (Patient not taking: No sig reported)    . hydrOXYzine (ATARAX/VISTARIL) 25 MG tablet Take 25 mg by mouth 3 (three) times daily as needed. (Patient not taking: No sig reported)    . ibuprofen (ADVIL,MOTRIN) 600 MG tablet Take 1 tablet (600 mg total) by mouth every 6 (six) hours as needed. (Patient not taking: No sig reported) 30 tablet 0  . ketoconazole (NIZORAL) 2 % shampoo SHAMPOO AS DIRECTED TOPICALLY SUNDAY,THURSDAY,SATURDAY (Patient not taking: Reported on 07/05/2020)    . ketorolac (TORADOL) 10 MG tablet Take 1 tablet (10 mg total) by mouth every 6 (six) hours as needed. (Patient not taking: No sig reported) 20 tablet 0  . lisinopril (ZESTRIL) 5 MG tablet Take by mouth.    . meloxicam (MOBIC) 15 MG tablet Take 1 tablet (15 mg total) by mouth daily. (Patient not taking: Reported on 07/05/2020) 30 tablet 0  . methocarbamol (ROBAXIN) 750 MG tablet Take 750 mg by mouth 4 (four) times daily. (Patient not taking: No sig reported)    . ondansetron (ZOFRAN-ODT) 4 MG disintegrating tablet ondansetron 4 mg disintegrating tablet (Patient not taking: No sig reported)    . pantoprazole (PROTONIX) 40 MG tablet Take 40 mg by mouth daily. (Patient not taking: No sig reported)    . polyvinyl alcohol (LIQUIFILM TEARS) 1.4 % ophthalmic solution INSTILL ONE DROP IN [facility] EYE TWO TIMES  A DAY FOR DRY EYES    . Pramoxine-HC 1-1 % OINT APPLY SMALL AMOUNT TOPICALLY EVERY DAY AS NEEDED TO AFFECTED AREAS. (Patient not taking: Reported on 07/05/2020)    . topiramate (TOPAMAX) 25 MG tablet Take 1 tablet at bedtime for one week, then increase to 2 tablets at bedtime. 60 tablet 0  . traMADol (ULTRAM) 50 MG tablet Take 1 tablet (50 mg total) by mouth every 6 (six) hours as needed. (Patient not taking: Reported on 07/05/2020) 12 tablet 0  . traMADol (ULTRAM) 50 MG tablet tramadol 50 mg tablet (Patient not taking: Reported on 07/05/2020)    . triamterene-hydrochlorothiazide (DYAZIDE) 37.5-25 MG capsule Take 1 capsule by mouth daily. (Patient not taking: No sig reported)     No current facility-administered medications on file prior to visit.    ALLERGIES: Allergies  Allergen Reactions  . Codeine Other (See Comments)  . Hctz [Hydrochlorothiazide] Other (See Comments)    Causes her potassium to drop  . Trimebutine   . Sertraline Anxiety    FAMILY HISTORY: Family History  Problem Relation Age of Onset  . Diabetes Mother  SOCIAL HISTORY: Social History   Socioeconomic History  . Marital status: Single    Spouse name: Not on file  . Number of children: Not on file  . Years of education: Not on file  . Highest education level: Not on file  Occupational History  . Not on file  Tobacco Use  . Smoking status: Never Smoker  . Smokeless tobacco: Never Used  Vaping Use  . Vaping Use: Not on file  Substance and Sexual Activity  . Alcohol use: No  . Drug use: No  . Sexual activity: Never  Other Topics Concern  . Not on file  Social History Narrative   Right handed   Social Determinants of Health   Financial Resource Strain: Not on file  Food Insecurity: Not on file  Transportation Needs: Not on file  Physical Activity: Not on file  Stress: Not on file  Social Connections: Not on file  Intimate Partner Violence: Not on file     Objective:  Blood pressure (!)  150/97, pulse 60, height 5\' 7"  (1.702 m), weight 245 lb (111.1 kg), SpO2 95 %. General: No acute distress.  Patient appears well-groomed.   Head:  Normocephalic/atraumatic Eyes:  Fundi examined but not visualized Neck: supple, bilateral paraspinal tenderness, full range of motion Heart:  Regular rate and rhythm Lungs:  Clear to auscultation bilaterally Back: No paraspinal tenderness Neurological Exam: alert and oriented to person, place, and time. Attention span and concentration intact, recent and remote memory intact, fund of knowledge intact.  Speech fluent and not dysarthric, language intact.  CN II-XII intact. Bulk and tone normal, muscle strength 5/5 throughout.  Sensation to light touch, temperature and vibration intact.  Deep tendon reflexes 2+ throughout, toes downgoing.  Finger to nose and heel to shin testing intact.  Gait normal, Romberg negative.   Assessment/Plan:   1.  New onset headaches.  MRI/MRA unremarkable.  May be migraines aggravated by stress and cervicalgia  1.  Continue topiramate 50mg  at bedtime.  We can increase dose by end of prescription if needed. 2.  Flexeril 5mg  at bedtime and physical therapy for neck pain  3.  Limit use of pain relievers to no more than 2 days out of week to prevent risk of rebound or medication-overuse headache. 4.  Keep headache diary 5. Follow up in June  , DO  CC: , MD

## 2020-08-04 ENCOUNTER — Other Ambulatory Visit: Payer: Self-pay

## 2020-08-04 ENCOUNTER — Ambulatory Visit (INDEPENDENT_AMBULATORY_CARE_PROVIDER_SITE_OTHER): Payer: No Typology Code available for payment source | Admitting: Neurology

## 2020-08-04 ENCOUNTER — Encounter: Payer: Self-pay | Admitting: Neurology

## 2020-08-04 VITALS — BP 150/97 | HR 60 | Ht 67.0 in | Wt 245.0 lb

## 2020-08-04 DIAGNOSIS — M542 Cervicalgia: Secondary | ICD-10-CM | POA: Diagnosis not present

## 2020-08-04 DIAGNOSIS — G43009 Migraine without aura, not intractable, without status migrainosus: Secondary | ICD-10-CM | POA: Diagnosis not present

## 2020-08-04 MED ORDER — CYCLOBENZAPRINE HCL 5 MG PO TABS: 5.0000 mg | ORAL_TABLET | Freq: Every day | ORAL | 5 refills | Status: DC

## 2020-08-04 NOTE — Patient Instructions (Addendum)
1.  Start cyclobenzaprine 5mg  at bedtime 2.  Continue topiramate.  If no improvement in headaches by end of prescription, contact me and we can increase dose 3.  Physical therapy for neck pain 4.  Keep appointment in June

## 2020-08-08 ENCOUNTER — Ambulatory Visit: Payer: No Typology Code available for payment source | Admitting: Neurology

## 2020-08-09 ENCOUNTER — Encounter: Payer: No Typology Code available for payment source | Admitting: Physical Therapy

## 2020-08-16 ENCOUNTER — Encounter: Payer: Self-pay | Admitting: Neurology

## 2020-08-16 ENCOUNTER — Telehealth: Payer: Self-pay | Admitting: Neurology

## 2020-08-16 NOTE — Telephone Encounter (Signed)
Patient came in wanting to get the Flexeril prescription sent to the [facility] so they will cover the prescription. Their fax number is [phone].

## 2020-08-17 ENCOUNTER — Other Ambulatory Visit: Payer: Self-pay | Admitting: Neurology

## 2020-08-17 MED ORDER — CYCLOBENZAPRINE HCL 5 MG PO TABS
5.0000 mg | ORAL_TABLET | Freq: Every day | ORAL | 5 refills | Status: DC
Start: 2020-08-17 — End: 2022-02-21

## 2020-08-17 NOTE — Telephone Encounter (Signed)
Script printed, signed, and ready to be faxed.

## 2020-08-17 NOTE — Telephone Encounter (Signed)
Script faxed to [place] at fax number given by pt

## 2020-08-23 ENCOUNTER — Encounter: Payer: Self-pay | Admitting: Physical Therapy

## 2020-08-23 ENCOUNTER — Ambulatory Visit
Payer: No Typology Code available for payment source | Attending: Obstetrics and Gynecology | Admitting: Physical Therapy

## 2020-08-23 ENCOUNTER — Other Ambulatory Visit: Payer: Self-pay

## 2020-08-23 DIAGNOSIS — R293 Abnormal posture: Secondary | ICD-10-CM | POA: Diagnosis present

## 2020-08-23 DIAGNOSIS — R279 Unspecified lack of coordination: Secondary | ICD-10-CM | POA: Diagnosis present

## 2020-08-23 DIAGNOSIS — M62838 Other muscle spasm: Secondary | ICD-10-CM | POA: Diagnosis not present

## 2020-08-23 NOTE — Therapy (Signed)
[facility] [address], [address] [place], [place], [address] Phone: [phone]   Fax:  [phone]  Physical Therapy Treatment  Patient Details  Name: Carolyn Gregory MRN: [id number] Date of Birth: 04/24/1961 Referring Provider (PT): Deans, Harley Alto, MD   Encounter Date: 08/23/2020   PT End of Session - 08/23/20 1211    Visit Number 16    Date for PT Re-Evaluation 10/14/20    Authorization Time Period 15 visits; 06/22/20 - 10/20/20    PT Start Time 1154   arrived late   PT Stop Time 1232    PT Time Calculation (min) 38 min    Activity Tolerance Patient tolerated treatment well    Behavior During Therapy [facility] for tasks assessed/performed           Past Medical History:  Diagnosis Date  . Anxiety   . Back pain   . Depression   . Hypertension     Past Surgical History:  Procedure Laterality Date  . CHOLECYSTECTOMY    . MOHS SURGERY     to remove benign growth  located in the L low abdomen  . R ankle surgery   2016   to repair lateral tendon   . VAGINAL HYSTERECTOMY      There were no vitals filed for this visit.   Subjective Assessment - 08/23/20 1201    Subjective I feel like I am emptying more completely, not waiting as long to go to the bathroom as much and having less urgency by doing that.    Patient Stated Goals decrease leakage and numbness in the back of the legs; be able to relax and empty completely    Currently in Pain? Yes    Pain Score 6     Pain Location Abdomen    Pain Orientation Mid    Pain Descriptors / Indicators Discomfort    Aggravating Factors  not sure just started today                             [facility] Adult PT Treatment/Exercise - 08/23/20 0001      Neuro Re-ed    Neuro Re-ed Details  TC for bulging and did better after internal STM      Lumbar Exercises: Sidelying   Clam Left;10 reps      Manual Therapy   Manual therapy comments pt identity  confirmed and informed consent given to treat internal soft tissue    Internal Pelvic Floor puborectalis and coccygeus releases bilaterally                       PT Long Term Goals - 08/01/20 0001      PT LONG TERM GOAL #1   Title Pt will report at least 50% reduced urgency    Baseline 40%      PT LONG TERM GOAL #2   Title Pt will be able to feel she can completely empty her bladder at least 3/4 or 75% of the time   pt states she had leakage 2x since the other day   Status On-going      PT LONG TERM GOAL #3   Title Pt will be able to demonstrate bulging of the pelvic floor muscles due to improved ability to relax making her able to toilet without straining    Baseline BM are still challenging and painful and I move from side to  side; not emptying all the way, small balls and hard stool    Status On-going      PT LONG TERM GOAL #4   Title Pt will be able to bend fwd to pick up things off the floor without leakage    Baseline panty liners 2-3/day    Status On-going      PT LONG TERM GOAL #5   Title Pt will have a BM every 3 days that feels normal due to improved muscle coordination    Status On-going                 Plan - 08/23/20 1212    Clinical Impression Statement Pt reports some good progress.  She has been getting more tension in posterior pelvic floor causing more difficulty with BMs so today's session focused on relaxing those muscles.  Pt had good release of the puborectalis and coccygeus muscles.  Pt had tension in the glutes with release using STM techniques. Pt was given clamshell exercises to focus on for HEP.  She already had this exercise but needed cues to do correctly and what muscle to feel was working.  Continue with skilled PT and current POC.    PT Treatment/Interventions Functional mobility training;Stair training;Gait training;Patient/family education;Aquatic Therapy;Therapeutic activities;Manual lymph drainage;Moist Heat;Neuromuscular  re-education;Therapeutic exercise;Manual techniques;Taping;Energy conservation;ADLs/Self Care Home Management;Electrical Stimulation    PT Next Visit Plan f/u on glute med strength clam shell; hip hinge and single leg standing ex's LE only due to shoulder and wrist pain    PT Home Exercise Plan Access Code: [email or web address]    Consulted and Agree with Plan of Care Patient           Patient will benefit from skilled therapeutic intervention in order to improve the following deficits and impairments:  Abnormal gait,Pain,Increased fascial restricitons,Decreased strength,Decreased scar mobility,Increased muscle spasms,Difficulty walking,Decreased mobility,Decreased range of motion,Decreased balance,Decreased safety awareness,Decreased coordination,Postural dysfunction,Improper body mechanics,Decreased endurance  Visit Diagnosis: Other muscle spasm  Abnormal posture  Unspecified lack of coordination     Problem List Patient Active Problem List   Diagnosis Date Noted  . Tear of medial cartilage or meniscus of knee, current 03/11/2020    Junious Silk, PT 08/23/2020, 12:46 PM  [facility] [address] [place], [place], [address] Phone: [phone]   Fax:  [phone]  Name: Carolyn Gregory MRN: [id number] Date of Birth: 08-30-1960

## 2020-08-26 ENCOUNTER — Ambulatory Visit: Payer: No Typology Code available for payment source | Admitting: Physical Therapy

## 2020-08-29 ENCOUNTER — Other Ambulatory Visit: Payer: Self-pay

## 2020-08-29 ENCOUNTER — Ambulatory Visit: Payer: No Typology Code available for payment source | Admitting: Physical Therapy

## 2020-08-29 DIAGNOSIS — R293 Abnormal posture: Secondary | ICD-10-CM

## 2020-08-29 DIAGNOSIS — R279 Unspecified lack of coordination: Secondary | ICD-10-CM

## 2020-08-29 DIAGNOSIS — M62838 Other muscle spasm: Secondary | ICD-10-CM | POA: Diagnosis not present

## 2020-08-29 NOTE — Therapy (Signed)
[facility] [address], [address] [place], [place], [address] Phone: [phone]   Fax:  [phone]  Physical Therapy Treatment  Patient Details  Name: Carolyn Gregory MRN: [id number] Date of Birth: [birth year] Referring Provider (PT): Deans, Harley Alto, MD   Encounter Date: 08/29/2020   PT End of Session - 08/29/20 1110    Visit Number 17    Date for PT Re-Evaluation 10/14/20    PT Start Time 1109   arrived   PT Stop Time 1149    PT Time Calculation (min) 40 min    Activity Tolerance Patient tolerated treatment well    Behavior During Therapy [facility] for tasks assessed/performed           Past Medical History:  Diagnosis Date  . Anxiety   . Back pain   . Depression   . Hypertension     Past Surgical History:  Procedure Laterality Date  . CHOLECYSTECTOMY    . MOHS SURGERY     to remove benign growth  located in the L low abdomen  . R ankle surgery   2016   to repair lateral tendon   . VAGINAL HYSTERECTOMY      There were no vitals filed for this visit.   Subjective Assessment - 08/29/20 1110    Subjective My left knee is where most of the pain is today.  My hands and wrists are bothering me today.  Pt states she was moving things and causing the pain.  Pt states she was waiting to go to void to work on the urgency, but she feels like she was waiting too long and feeling worried about squeezing too much.  I am also drinking more water and able to wait a little longer to go to the bathroom.    Patient Stated Goals decrease leakage and numbness in the back of the legs; be able to relax and empty completely                             [facility] Adult PT Treatment/Exercise - 08/29/20 0001      Self-Care   Other Self-Care Comments  review urge techniques and how to use but not waiting too long if the urge is actually there (8 sec rule for bladder emptying); food diary      Lumbar Exercises:  Supine   Other Supine Lumbar Exercises activate core with SAQ      Manual Therapy   Myofascial Release colon fascial release and rectum in all six planes of movement                       PT Long Term Goals - 08/29/20 0001      PT LONG TERM GOAL #1   Title Pt will report at least 50% reduced urgency    Baseline 50%    Status Achieved      PT LONG TERM GOAL #2   Title Pt will be able to feel she can completely empty her bladder at least 3/4 or 75% of the time   I do feel like I am fully emptying   Status Achieved      PT LONG TERM GOAL #3   Title Pt will be able to demonstrate bulging of the pelvic floor muscles due to improved ability to relax making her able to toilet without straining    Baseline It seems the same,  but got better with brussel sprouts and I had no straining and everyting was normal, but not it is back to being hard                 Plan - 08/29/20 1219    Clinical Impression Statement Pt has made progress with decreased leakage and urgency as mentioned in goal updates. Pt was having knee pain today but reported that it felt a little better after today's session. Exercises today focused on core strength with basic quad activation for challenging the core.  Pt needed some education on more details of what to focus on when using the urge techniques and educated on continuing to monitor foods with diary to see if she can identify food triggers.  pt will benefit from skilled PT to continue progression of core strength and improved muscle coordination for regular BMs.    PT Treatment/Interventions Functional mobility training;Stair training;Gait training;Patient/family education;Aquatic Therapy;Therapeutic activities;Manual lymph drainage;Moist Heat;Neuromuscular re-education;Therapeutic exercise;Manual techniques;Taping;Energy conservation;ADLs/Self Care Home Management;Electrical Stimulation           Patient will benefit from skilled therapeutic  intervention in order to improve the following deficits and impairments:  Abnormal gait,Pain,Increased fascial restricitons,Decreased strength,Decreased scar mobility,Increased muscle spasms,Difficulty walking,Decreased mobility,Decreased range of motion,Decreased balance,Decreased safety awareness,Decreased coordination,Postural dysfunction,Improper body mechanics,Decreased endurance  Visit Diagnosis: Other muscle spasm  Abnormal posture  Unspecified lack of coordination     Problem List Patient Active Problem List   Diagnosis Date Noted  . Tear of medial cartilage or meniscus of knee, current 03/11/2020    Junious Silk, PT 08/29/2020, 12:52 PM   [facility] [address] [place], [place], [address] Phone: [phone]   Fax:  [phone]  Name: Carolyn Gregory MRN: [id number] Date of Birth: 11/14/1960

## 2020-09-05 ENCOUNTER — Encounter: Payer: No Typology Code available for payment source | Admitting: Physical Therapy

## 2020-09-12 ENCOUNTER — Ambulatory Visit
Payer: No Typology Code available for payment source | Attending: Obstetrics and Gynecology | Admitting: Physical Therapy

## 2020-09-12 ENCOUNTER — Other Ambulatory Visit: Payer: Self-pay

## 2020-09-12 ENCOUNTER — Encounter: Payer: Self-pay | Admitting: Physical Therapy

## 2020-09-12 DIAGNOSIS — M62838 Other muscle spasm: Secondary | ICD-10-CM | POA: Insufficient documentation

## 2020-09-12 DIAGNOSIS — R293 Abnormal posture: Secondary | ICD-10-CM | POA: Insufficient documentation

## 2020-09-12 DIAGNOSIS — R279 Unspecified lack of coordination: Secondary | ICD-10-CM | POA: Insufficient documentation

## 2020-09-12 NOTE — Patient Instructions (Signed)
Access Code: [email or web address] URL: [email or web address] Date: 09/12/2020 Prepared by: Dwana Curd  Exercises Supine Butterfly Groin Stretch - 1 x daily - 7 x weekly - 3 reps - 1 sets - 30 sec hold Supine Figure 4 Piriformis Stretch - 1 x daily - 7 x weekly - 3 reps - 1 sets - 30 sec hold Hook Lying Single Knee to Chest Stretch with Towel - 1 x daily - 7 x weekly - 3 sets - 10 reps Standing Lumbar Spine Flexion Stretch Counter - 1 x daily - 7 x weekly - 5 reps - 1 sets - 10 hold Standing with Back Flat Against Wall - 1 x daily - 7 x weekly - 10 reps - 1 sets - 5 sec hold Supine Transversus Abdominis Bracing with Heel Slide - 1 x daily - 7 x weekly - 2 sets - 10 reps Table Lean - 3 x daily - 7 x weekly - 1 sets - 10 reps Seated Long Arc Quad with Hip Adduction - 1 x daily - 7 x weekly - 3 sets - 10 reps Seated March with Resistance - 1 x daily - 7 x weekly - 3 sets - 10 reps Hooklying Small March - 1 x daily - 7 x weekly - 2 sets - 10 reps Seated Exhale with Pelvic Floor Contraction and Med Ball Lift - 3 x daily - 7 x weekly - 1 sets - 10 reps - 1 hold

## 2020-09-12 NOTE — Therapy (Signed)
[facility] [address] [place], [place], [address] Phone: [phone]   Fax:  [phone]  Physical Therapy Treatment  Patient Details  Name: Carolyn Gregory MRN: [id number] Date of Birth: [birth year] Referring Provider (PT): Deans, Hilma Favors, MD   Encounter Date: 09/12/2020   PT End of Session - 09/12/20 1148    Visit Number 18    Date for PT Re-Evaluation 10/14/20    Authorization Type [facility]    Authorization Time Period 15 visits; 06/22/20 - 10/20/20    PT Start Time 1106    PT Stop Time 1145    PT Time Calculation (min) 39 min    Activity Tolerance Patient tolerated treatment well    Behavior During Therapy [facility] for tasks assessed/performed           Past Medical History:  Diagnosis Date  . Anxiety   . Back pain   . Depression   . Hypertension     Past Surgical History:  Procedure Laterality Date  . CHOLECYSTECTOMY    . MOHS SURGERY     to remove benign growth  located in the L low abdomen  . R ankle surgery   2016   to repair lateral tendon   . VAGINAL HYSTERECTOMY      There were no vitals filed for this visit.   Subjective Assessment - 09/12/20 1115    Subjective I feel like it has been challenging to feel that I am contracting the muscles.  It isn't feeling stronger and I have difficulty when I am trying to get to the bathroom, the urine is just coming.  The bowel movements are happening every 2 days.    Patient Stated Goals decrease leakage and numbness in the back of the legs; be able to relax and empty completely    Currently in Pain? No/denies                          Pelvic Floor Special Questions - 09/12/20 0001    Pelvic Floor Internal Exam identity confirmed; pt was explained the details of the exam and pt consented verbally without contraindications .    Exam Type Vaginal    Strength fair squeeze, definite lift    Strength # of seconds 1    Tone higher when  trying to hold the contraction             [facility] Adult PT Treatment/Exercise - 09/12/20 0001      Neuro Re-ed    Neuro Re-ed Details  bulging and pelvic floor exercise with TC - able to hold 1 sec, did not relax completely when holding longer; seated kegel with breathing to coordinate      Lumbar Exercises: Seated   Other Seated Lumbar Exercises kegel with exhale and bicep curl                  PT Education - 09/12/20 1148    Education Details Access Code: [email or web address]    Person(s) Educated Patient    Methods Explanation;Demonstration;Tactile cues;Verbal cues;Handout    Comprehension Verbalized understanding;Returned demonstration               PT Long Term Goals - 09/12/20 0001      PT LONG TERM GOAL #5   Title Pt will have a BM every 3 days that feels normal due to improved muscle coordination    Baseline every 2 days  Status Achieved                 Plan - 09/12/20 1154    Clinical Impression Statement Pt has met goal for BM at least every 2 days and has been doing much better with bulging the pelvic floor.  Today focus was on correctly performing pelvic floor ex's and used cues internally to illicite and give feedback for muscle contraction and relaxation. Pt was able to relax with cues to bulge using deep breathing.  She was able to contract and hold for one second at a time.  Pt was given seated exercise due to that being the most comfortable position for her based on knee and shoulder pain.  Pt will benefit from skilled PT to address the pelvic floor endurance and strength for improved bladder control.    PT Treatment/Interventions Functional mobility training;Stair training;Gait training;Patient/family education;Aquatic Therapy;Therapeutic activities;Manual lymph drainage;Moist Heat;Neuromuscular re-education;Therapeutic exercise;Manual techniques;Taping;Energy conservation;ADLs/Self Care Home Management;Electrical Stimulation    PT Next Visit Plan f/u on  seated pelvic floor contract with exhale/lift; continue glute med strength clam shell if able; hip hinge and single leg standing ex's LE only due to shoulder and wrist pain    PT Home Exercise Plan Access Code: [email or web address]    Consulted and Agree with Plan of Care Patient           Patient will benefit from skilled therapeutic intervention in order to improve the following deficits and impairments:  Abnormal gait,Pain,Increased fascial restricitons,Decreased strength,Decreased scar mobility,Increased muscle spasms,Difficulty walking,Decreased mobility,Decreased range of motion,Decreased balance,Decreased safety awareness,Decreased coordination,Postural dysfunction,Improper body mechanics,Decreased endurance  Visit Diagnosis: Other muscle spasm  Abnormal posture  Unspecified lack of coordination     Problem List Patient Active Problem List   Diagnosis Date Noted  . Tear of medial cartilage or meniscus of knee, current 03/11/2020    Jule Ser, PT 09/12/2020, 12:13 PM  [facility] [address], [place], [place], [address] Phone: [phone]   Fax:  [phone]  Name: Carolyn Gregory MRN: [id number] Date of Birth: [birth year]

## 2020-09-16 ENCOUNTER — Ambulatory Visit: Payer: No Typology Code available for payment source | Admitting: Physical Therapy

## 2020-09-19 ENCOUNTER — Encounter: Payer: Self-pay | Admitting: Physical Therapy

## 2020-09-19 ENCOUNTER — Ambulatory Visit: Payer: No Typology Code available for payment source | Admitting: Physical Therapy

## 2020-09-19 ENCOUNTER — Other Ambulatory Visit: Payer: Self-pay

## 2020-09-19 DIAGNOSIS — M62838 Other muscle spasm: Secondary | ICD-10-CM | POA: Diagnosis not present

## 2020-09-19 DIAGNOSIS — R279 Unspecified lack of coordination: Secondary | ICD-10-CM

## 2020-09-19 DIAGNOSIS — R293 Abnormal posture: Secondary | ICD-10-CM

## 2020-09-19 NOTE — Therapy (Signed)
[facility] [address], [address] [place], [place], [address] Phone: [phone]   Fax:  [phone]  Physical Therapy Treatment  Patient Details  Name: Carolyn Gregory MRN: [id number] Date of Birth: 1961-06-17 Referring Provider (PT): Deans, Harley Alto, MD   Encounter Date: 09/19/2020   PT End of Session - 09/19/20 1507    Visit Number 19    Number of Visits 26    Date for PT Re-Evaluation 10/14/20    Authorization Type [facility]    Authorization Time Period 15 visits; 06/22/20 - 10/20/20    Authorization - Visit Number 8    Authorization - Number of Visits 15    PT Start Time 1104    PT Stop Time 1145    PT Time Calculation (min) 41 min    Activity Tolerance Patient tolerated treatment well    Behavior During Therapy [facility] for tasks assessed/performed           Past Medical History:  Diagnosis Date  . Anxiety   . Back pain   . Depression   . Hypertension     Past Surgical History:  Procedure Laterality Date  . CHOLECYSTECTOMY    . MOHS SURGERY     to remove benign growth  located in the L low abdomen  . R ankle surgery   2016   to repair lateral tendon   . VAGINAL HYSTERECTOMY      There were no vitals filed for this visit.   Subjective Assessment - 09/19/20 1518    Subjective The BM was only 3/10 pain today mostly on the Lt, normally 6/10.  Still only having BM every 2 days.  I want to make sure I am not tensing the pelvic floor too much    Patient Stated Goals decrease leakage and numbness in the back of the legs; be able to relax and empty completely    Currently in Pain? No/denies                             [facility] Adult PT Treatment/Exercise - 09/19/20 0001      Lumbar Exercises: Standing   Other Standing Lumbar Exercises modified lean on table to isolate pelvic floor - 1 sec hold x 2 min    Other Standing Lumbar Exercises pelvic tilts at the wall      Lumbar Exercises: Seated    Other Seated Lumbar Exercises kegel with exhale and bicep curl - 2 lbs                       PT Long Term Goals - 09/19/20 0001      PT LONG TERM GOAL #3   Title Pt will be able to demonstrate bulging of the pelvic floor muscles due to improved ability to relax making her able to toilet without straining    Baseline improved BM happen every 2 days; straining is 4/10 with 10 being max strain - this is normal for me but would like to get better; pain is mostly on the Lt and 6/10    Status On-going      PT LONG TERM GOAL #4   Title Pt will be able to bend fwd to pick up things off the floor without leakage    Baseline 3/day    Status On-going                 Plan -  09/19/20 1510    Clinical Impression Statement Pt making progress towards her remaining unmet goals with improved BM and still working on leakage but has just started to be able to demonstrate improved sensation of pelvic floor and able to relax between reps when doing the strengthening. Pt will benefit from skilled PT to continue address strength and endurance.  She is able to engage the muscle for the exhale but still needing cues on when to exhale for correct muscle firing patterns.    PT Treatment/Interventions Functional mobility training;Stair training;Gait training;Patient/family education;Aquatic Therapy;Therapeutic activities;Manual lymph drainage;Moist Heat;Neuromuscular re-education;Therapeutic exercise;Manual techniques;Taping;Energy conservation;ADLs/Self Care Home Management;Electrical Stimulation    PT Next Visit Plan f/u on HEP pelvic floor isolation; see if she can hold while breathing    PT Home Exercise Plan Access Code: [email or web address]    Consulted and Agree with Plan of Care Patient           Patient will benefit from skilled therapeutic intervention in order to improve the following deficits and impairments:  Abnormal gait,Pain,Increased fascial restricitons,Decreased strength,Decreased scar  mobility,Increased muscle spasms,Difficulty walking,Decreased mobility,Decreased range of motion,Decreased balance,Decreased safety awareness,Decreased coordination,Postural dysfunction,Improper body mechanics,Decreased endurance  Visit Diagnosis: Other muscle spasm  Abnormal posture  Unspecified lack of coordination     Problem List Patient Active Problem List   Diagnosis Date Noted  . Tear of medial cartilage or meniscus of knee, current 03/11/2020    Junious Silk, PT 09/19/2020, 3:19 PM  [facility] [address] [place], [place], [address] Phone: [phone]   Fax:  [phone]  Name: Carolyn Gregory MRN: [id number] Date of Birth: 10-18-1960

## 2020-09-26 ENCOUNTER — Encounter: Payer: No Typology Code available for payment source | Admitting: Physical Therapy

## 2020-10-03 ENCOUNTER — Other Ambulatory Visit: Payer: Self-pay

## 2020-10-03 ENCOUNTER — Encounter: Payer: Self-pay | Admitting: Physical Therapy

## 2020-10-03 ENCOUNTER — Ambulatory Visit: Payer: No Typology Code available for payment source | Admitting: Physical Therapy

## 2020-10-03 DIAGNOSIS — R279 Unspecified lack of coordination: Secondary | ICD-10-CM

## 2020-10-03 DIAGNOSIS — M62838 Other muscle spasm: Secondary | ICD-10-CM

## 2020-10-03 DIAGNOSIS — R293 Abnormal posture: Secondary | ICD-10-CM

## 2020-10-03 NOTE — Therapy (Signed)
[facility] [address] [place], [place], [address] Phone: [phone]   Fax:  [phone]  Physical Therapy Treatment  Patient Details  Name: Carolyn Gregory MRN: [id number] Date of Birth: 1961/01/18 Referring Provider (PT): Deans, Harley Alto, MD   Encounter Date: 10/03/2020   PT End of Session - 10/03/20 1107    Visit Number 20    Number of Visits 26    Date for PT Re-Evaluation 10/14/20    Authorization Type [place]    Authorization Time Period 15 visits; 06/22/20 - 10/20/20    Authorization - Visit Number 9    Authorization - Number of Visits 15    PT Start Time 1104    PT Stop Time 1144    PT Time Calculation (min) 40 min    Activity Tolerance Patient tolerated treatment well           Past Medical History:  Diagnosis Date  . Anxiety   . Back pain   . Depression   . Hypertension     Past Surgical History:  Procedure Laterality Date  . CHOLECYSTECTOMY    . MOHS SURGERY     to remove benign growth  located in the L low abdomen  . R ankle surgery   2016   to repair lateral tendon   . VAGINAL HYSTERECTOMY      There were no vitals filed for this visit.   Subjective Assessment - 10/03/20 1106    Subjective pt states she had full incontinence of BM after previous.  BM is overall 50% better.  Knee pain has been better    Currently in Pain? No/denies                             [facility] Adult PT Treatment/Exercise - 10/03/20 0001      Lumbar Exercises: Standing   Other Standing Lumbar Exercises lean on table kegel and LE ext an dabduction - 10x each      Lumbar Exercises: Seated   Hip Flexion on Ball Limitations hip flexion in chair - blue band and cues to activate the core    Other Seated Lumbar Exercises clam in sitting blue loop - 10 x single leg                       PT Long Term Goals - 09/19/20 0001      PT LONG TERM GOAL #3   Title Pt will be able to  demonstrate bulging of the pelvic floor muscles due to improved ability to relax making her able to toilet without straining    Baseline improved BM happen every 2 days; straining is 4/10 with 10 being max strain - this is normal for me but would like to get better; pain is mostly on the Lt and 6/10    Status On-going      PT LONG TERM GOAL #4   Title Pt will be able to bend fwd to pick up things off the floor without leakage    Baseline 3/day    Status On-going                 Plan - 10/03/20 1142    Clinical Impression Statement Pt had full incontinence of feces  since previous visit.  Overall, she has felt like the constipation has been 50% improved.  Pt was able to progress core and hip  strength and managed lumbar pain with increased core activation.  Pt will benefit from skilled PT to continue to work on strength and endurance for reduced leakage.    PT Treatment/Interventions Functional mobility training;Stair training;Gait training;Patient/family education;Aquatic Therapy;Therapeutic activities;Manual lymph drainage;Moist Heat;Neuromuscular re-education;Therapeutic exercise;Manual techniques;Taping;Energy conservation;ADLs/Self Care Home Management;Electrical Stimulation    PT Next Visit Plan f/u on HEP pelvic floor isolation; lean with LE movements; see if she can hold longer for endurance while breathing, wall squat? (see if knee feeling better)    PT Home Exercise Plan Access Code: [email or web address]    Consulted and Agree with Plan of Care Patient           Patient will benefit from skilled therapeutic intervention in order to improve the following deficits and impairments:  Abnormal gait,Pain,Increased fascial restricitons,Decreased strength,Decreased scar mobility,Increased muscle spasms,Difficulty walking,Decreased mobility,Decreased range of motion,Decreased balance,Decreased safety awareness,Decreased coordination,Postural dysfunction,Improper body mechanics,Decreased  endurance  Visit Diagnosis: Other muscle spasm  Abnormal posture  Unspecified lack of coordination     Problem List Patient Active Problem List   Diagnosis Date Noted  . Tear of medial cartilage or meniscus of knee, current 03/11/2020    Junious Silk, PT 10/03/2020, 11:49 AM  [facility] [address], [address] [place], [place], [address] Phone: [phone]   Fax:  [phone]  Name: Carolyn Gregory MRN: [id number] Date of Birth: [birth year]

## 2020-10-11 ENCOUNTER — Other Ambulatory Visit: Payer: Self-pay

## 2020-10-11 ENCOUNTER — Ambulatory Visit
Payer: No Typology Code available for payment source | Attending: Obstetrics and Gynecology | Admitting: Physical Therapy

## 2020-10-11 ENCOUNTER — Encounter: Payer: Self-pay | Admitting: Physical Therapy

## 2020-10-11 DIAGNOSIS — R279 Unspecified lack of coordination: Secondary | ICD-10-CM | POA: Diagnosis present

## 2020-10-11 DIAGNOSIS — M62838 Other muscle spasm: Secondary | ICD-10-CM | POA: Diagnosis present

## 2020-10-11 DIAGNOSIS — R293 Abnormal posture: Secondary | ICD-10-CM | POA: Insufficient documentation

## 2020-10-11 NOTE — Patient Instructions (Addendum)
STRETCHING THE PELVIC FLOOR MUSCLES NO DILATOR  Supplies . Vaginal lubricant, coconut oil, vitamin E . Mirror (optional) . Gloves (optional) or clean hands Positioning . Start in a semi-reclined position with your head propped up. Bend your knees and place your thumb or finger at the vaginal opening. Procedure . Apply a moderate amount of lubricant on the outer skin of your vagina, the labia minora.  Apply additional lubricant to your finger. Marland Kitchen Spread the skin away from the vaginal opening. Place the end of your finger at the opening. . Do a maximum contraction of the pelvic floor muscles. Tighten the vagina and the anus maximally and relax. . When you know they are relaxed, gently and slowly insert your finger into your vagina, directing your finger slightly downward, for 2-3 inches of insertion. . Relax and stretch the 6 o'clock position . Hold each stretch for _30-60 seconds, no pain more than 3/10 . Repeat the stretching in the 4 o'clock and 8 o'clock positions. . Next gently move your finger in a "U" shape  several times.  . You can also enter a second finger to work to spread the vaginal opening wider from 3:00-6:00 and 6:00-9:00 or 3:00-9:00 . Perform daily or every other day . Once you have accomplished the techniques you may try them in standing with one foot resting on the tub, or in other positions.  This is a good stretch to do in the shower if you don't need to use lubricant.   [facility] [address], [address] [place], [place] [address] Phone # [phone] Fax [phone]

## 2020-10-12 NOTE — Therapy (Signed)
[facility] [address], [address] [place], [place], [address] Phone: [phone]   Fax:  [phone]  Physical Therapy Treatment  Patient Details  Name: Carolyn Gregory MRN: [id number] Date of Birth: [birth year] Referring Provider (PT): Deans, Harley Alto, MD   Encounter Date: 10/11/2020   PT End of Session - 10/12/20 2044    Visit Number 21    Number of Visits 26    Date for PT Re-Evaluation 10/14/20    Authorization Type [place]    Authorization Time Period 15 visits; 06/22/20 - 10/20/20    Authorization - Visit Number 10    Authorization - Number of Visits 15    PT Start Time 1150    PT Stop Time 1228    PT Time Calculation (min) 38 min    Activity Tolerance Patient tolerated treatment well    Behavior During Therapy [facility] for tasks assessed/performed           Past Medical History:  Diagnosis Date  . Anxiety   . Back pain   . Depression   . Hypertension     Past Surgical History:  Procedure Laterality Date  . CHOLECYSTECTOMY    . MOHS SURGERY     to remove benign growth  located in the L low abdomen  . R ankle surgery   2016   to repair lateral tendon   . VAGINAL HYSTERECTOMY      There were no vitals filed for this visit.   Subjective Assessment - 10/12/20 2044    Subjective I did not have any fecal incontinence but sometimes still discomfort with bowel movement.  The urge is getting better    How long can you walk comfortably? can walk around the store    Patient Stated Goals decrease leakage and numbness in the back of the legs; be able to relax and empty completely    Currently in Pain? No/denies                          Pelvic Floor Special Questions - 10/12/20 0001    Pelvic Floor Internal Exam identity confirmed; pt was explained the details of the exam and pt consented verbally without contraindications .    Exam Type Vaginal    Strength fair squeeze, definite lift    Strength  # of seconds -1             [facility] Adult PT Treatment/Exercise - 10/12/20 0001      Self-Care   Other Self-Care Comments  educated on stretch to pelvic floor to do at home      Neuro Re-ed    Neuro Re-ed Details  kegel with contract 50% to increase time of hold; cues to relax all the way                       PT Long Term Goals - 10/11/20 0001      PT LONG TERM GOAL #3   Title Pt will be able to demonstrate bulging of the pelvic floor muscles due to improved ability to relax making her able to toilet without straining    Baseline 6/10    Status On-going      PT LONG TERM GOAL #4   Title Pt will be able to bend fwd to pick up things off the floor without leakage    Baseline 3/day; I don't know until I go to the  bathroom    Status On-going                 Plan - 10/12/20 2045    Clinical Impression Statement Todays' session included re-assess pelvic floor.  She is still demonstrating low endurance.  overall, she is improved but somewhat limited from making progress due to shoulder pain and knee pain.  she is having a hard time doing exercises prescribed due to reports of the exercises causing shoulder and knee pain.  Pt will benefit from one more PT visit to ensure maximum function and that she has understanding of HEP to continue strengthening pelvic floor at home and that way she will be able to begin therapy for other body parts.    PT Treatment/Interventions Functional mobility training;Stair training;Gait training;Patient/family education;Aquatic Therapy;Therapeutic activities;Manual lymph drainage;Moist Heat;Neuromuscular re-education;Therapeutic exercise;Manual techniques;Taping;Energy conservation;ADLs/Self Care Home Management;Electrical Stimulation    PT Next Visit Plan f/u on HEP pelvic floor isolation; final HEP and re-assess    PT Home Exercise Plan Access Code: [email or web address]    Consulted and Agree with Plan of Care Patient           Patient will  benefit from skilled therapeutic intervention in order to improve the following deficits and impairments:  Abnormal gait,Pain,Increased fascial restricitons,Decreased strength,Decreased scar mobility,Increased muscle spasms,Difficulty walking,Decreased mobility,Decreased range of motion,Decreased balance,Decreased safety awareness,Decreased coordination,Postural dysfunction,Improper body mechanics,Decreased endurance  Visit Diagnosis: Other muscle spasm  Abnormal posture  Unspecified lack of coordination     Problem List Patient Active Problem List   Diagnosis Date Noted  . Tear of medial cartilage or meniscus of knee, current 03/11/2020    Junious Silk, PT 10/12/2020, 8:50 PM  [facility] [address] [place], [place], [address] Phone: [phone]   Fax:  [phone]  Name: Carolyn Gregory MRN: [id number] Date of Birth: 1960/12/07

## 2020-10-17 ENCOUNTER — Other Ambulatory Visit: Payer: Self-pay

## 2020-10-17 ENCOUNTER — Encounter: Payer: Self-pay | Admitting: Physical Therapy

## 2020-10-17 ENCOUNTER — Ambulatory Visit: Payer: No Typology Code available for payment source | Admitting: Physical Therapy

## 2020-10-17 DIAGNOSIS — M62838 Other muscle spasm: Secondary | ICD-10-CM | POA: Diagnosis not present

## 2020-10-17 DIAGNOSIS — R279 Unspecified lack of coordination: Secondary | ICD-10-CM

## 2020-10-17 DIAGNOSIS — R293 Abnormal posture: Secondary | ICD-10-CM

## 2020-10-17 NOTE — Patient Instructions (Signed)
[id number] and pelvic wand information

## 2020-10-17 NOTE — Therapy (Addendum)
[facility] [address], [place], [place], [address] Phone: [phone]   Fax:  [phone]  Physical Therapy Treatment  Patient Details  Name: Carolyn Gregory MRN: [id number] Date of Birth: [birth year] Referring Provider (PT): Deans, Hilma Favors, MD   Encounter Date: 10/17/2020   PT End of Session - 10/17/20 1402    Visit Number 22    Number of Visits 26    Date for PT Re-Evaluation 10/17/20    PT Start Time 1400    PT Stop Time 1440    PT Time Calculation (min) 40 min    Activity Tolerance Patient tolerated treatment well    Behavior During Therapy [facility] for tasks assessed/performed           Past Medical History:  Diagnosis Date  . Anxiety   . Back pain   . Depression   . Hypertension     Past Surgical History:  Procedure Laterality Date  . CHOLECYSTECTOMY    . MOHS SURGERY     to remove benign growth  located in the L low abdomen  . R ankle surgery   2016   to repair lateral tendon   . VAGINAL HYSTERECTOMY      There were no vitals filed for this visit.   Subjective Assessment - 10/17/20 1405    Subjective I work on stretching the vaginal canal 1x/day and it was very tender while doing that it was the same every time.    Patient Stated Goals decrease leakage and numbness in the back of the legs; be able to relax and empty completely              [facility] PT Assessment - 10/17/20 0001      Assessment   Medical Diagnosis R32 (ICD-10-CM) - Unspecified urinary incontinence    Referring Provider (PT) Deans, Hilma Favors, MD                         [facility] Adult PT Treatment/Exercise - 10/17/20 0001      Self-Care   Other Self-Care Comments  educated on using pelvic release wand      Neuro Re-ed    Neuro Re-ed Details  kegel with contract 50% to increase time of hold; cues to relax all the way      Exercises   Exercises Other Exercises    Other Exercises  reviewed  verbally all HEP exercises      Lumbar Exercises: Seated   Hip Flexion on Ball Limitations hip flexion in chair - blue band and cues to activate the core    Other Seated Lumbar Exercises clam in sitting blue band - 10 x single leg                  PT Education - 10/17/20 1444    Education Details HEP and pelvic wand    Person(s) Educated Patient    Methods Explanation;Demonstration;Tactile cues;Verbal cues;Handout    Comprehension Verbalized understanding;Returned demonstration               PT Long Term Goals - 10/17/20 0001      PT LONG TERM GOAL #1   Title Pt will report at least 50% reduced urgency    Status Achieved      PT LONG TERM GOAL #2   Title Pt will be able to feel she can completely empty her bladder at least 3/4 or 75% of the  time    Status Achieved      PT LONG TERM GOAL #3   Title Pt will be able to demonstrate bulging of the pelvic floor muscles due to improved ability to relax making her able to toilet without straining    Baseline 6/10    Status Not Met      PT LONG TERM GOAL #4   Title Pt will be able to bend fwd to pick up things off the floor without leakage    Baseline 3-5/day; I don't know until I go to the bathroom    Status Not Met      PT LONG TERM GOAL #5   Title Pt will have a BM every 3 days that feels normal due to improved muscle coordination    Status Achieved                 Plan - 10/17/20 1410    Clinical Impression Statement Pt has met most goals at this time. Today was one time visit in order to ensure she is able to continue with HEP.  Pt has the tools and knowledge to continue with pelvic soft tissue release and exercises as she has been doing.  At this time pt is recommended to continue working towards her remaining goals with the tools she has to work with.  Pt will discharge from skilled PT today for pelvic floor therapy.    PT Treatment/Interventions Functional mobility training;Stair training;Gait  training;Patient/family education;Aquatic Therapy;Therapeutic activities;Manual lymph drainage;Moist Heat;Neuromuscular re-education;Therapeutic exercise;Manual techniques;Taping;Energy conservation;ADLs/Self Care Home Management;Electrical Stimulation    PT Next Visit Plan d/c today    PT Home Exercise Plan Access Code: [email or web address]    Consulted and Agree with Plan of Care Patient           Patient will benefit from skilled therapeutic intervention in order to improve the following deficits and impairments:  Abnormal gait,Pain,Increased fascial restricitons,Decreased strength,Decreased scar mobility,Increased muscle spasms,Difficulty walking,Decreased mobility,Decreased range of motion,Decreased balance,Decreased safety awareness,Decreased coordination,Postural dysfunction,Improper body mechanics,Decreased endurance  Visit Diagnosis: Other muscle spasm  Abnormal posture  Unspecified lack of coordination     Problem List Patient Active Problem List   Diagnosis Date Noted  . Tear of medial cartilage or meniscus of knee, current 03/11/2020    Jule Ser, PT 10/17/2020, 3:33 PM  [facility] [address] [place], [place], [address] Phone: [phone]   Fax:  [phone]  Name: Carolyn Gregory MRN: [id number] Date of Birth: June 11, 1961  PHYSICAL THERAPY DISCHARGE SUMMARY  Visits from Start of Care: 22  Current functional level related to goals / functional outcomes: See above goals   Remaining deficits: See above   Education / Equipment: HEP  Plan: Patient agrees to discharge.  Patient goals were partially met. Patient is being discharged due to lack of progress.  ?????    Remaining goals not progressing, will need PT to address other areas for more full body approach so she can progress strength  Gustavus Bryant, PT 10/17/20 4:16 PM

## 2020-12-18 ENCOUNTER — Other Ambulatory Visit: Payer: Self-pay

## 2020-12-18 ENCOUNTER — Emergency Department
Admission: EM | Admit: 2020-12-18 | Discharge: 2020-12-18 | Disposition: A | Discharge status: Home / Self Care | Payer: No Typology Code available for payment source | Attending: Emergency Medicine | Admitting: Emergency Medicine

## 2020-12-18 ENCOUNTER — Encounter: Payer: Self-pay | Admitting: Emergency Medicine

## 2020-12-18 ENCOUNTER — Emergency Department: Payer: No Typology Code available for payment source

## 2020-12-18 DIAGNOSIS — M542 Cervicalgia: Secondary | ICD-10-CM | POA: Diagnosis present

## 2020-12-18 DIAGNOSIS — M47812 Spondylosis without myelopathy or radiculopathy, cervical region: Secondary | ICD-10-CM | POA: Diagnosis not present

## 2020-12-18 DIAGNOSIS — M19012 Primary osteoarthritis, left shoulder: Secondary | ICD-10-CM | POA: Diagnosis not present

## 2020-12-18 DIAGNOSIS — Z79899 Other long term (current) drug therapy: Secondary | ICD-10-CM | POA: Diagnosis not present

## 2020-12-18 DIAGNOSIS — I10 Essential (primary) hypertension: Secondary | ICD-10-CM | POA: Diagnosis not present

## 2020-12-18 MED ORDER — PREDNISONE 10 MG PO TABS: ORAL_TABLET | ORAL | 0 refills | Status: DC

## 2020-12-18 MED ORDER — METHOCARBAMOL 500 MG PO TABS: 500.0000 mg | ORAL_TABLET | Freq: Every evening | ORAL | 0 refills | Status: DC | PRN

## 2020-12-18 MED ORDER — DEXAMETHASONE SODIUM PHOSPHATE 10 MG/ML IJ SOLN
10.0000 mg | Freq: Once | INTRAMUSCULAR | Status: AC
  Administered 2020-12-18: 12:00:00 10 mg via INTRAMUSCULAR
  Filled 2020-12-18: qty 1

## 2020-12-18 NOTE — Discharge Instructions (Addendum)
You were seen today for acute neck and shoulder pain. 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 We have put you on Prednisone and muscle relaxers. I have given you stretching exercises to try. Please follow up with your PCP if symptoms persist or worsen.

## 2020-12-18 NOTE — ED Triage Notes (Signed)
Pt reports pain to her left shoulder for the past 2 days. Pt reports pain worse when she left her left arm. Denies obvious injuries

## 2020-12-18 NOTE — ED Notes (Signed)
See triage note  Presents with some discomfort to left shoulder  States discomfort started from neck and moves into arm

## 2020-12-18 NOTE — ED Provider Notes (Signed)
[facility] Emergency Department Provider Note ____________________________________________  Time seen: 1115  I have reviewed the triage vital signs and the nursing notes.  HISTORY  Chief Complaint  Shoulder Pain   HPI Carolyn Gregory is a 60 y.o. female presents to the ER today with complaint of acute neck pain and left shoulder pain.  She reports this started 2 days ago.  She describes the pain as a constant achy, throbbing and pulling sensation, worse with movement.  She denies headache, dizziness or visual changes.  She has noticed some left arm weakness and swelling of the left hand but denies associated numbness or tingling.  She has not noticed any rash in the area.  She denies any injury to the area.  She has taken Tylenol and muscle relaxers with minimal relief of symptoms.  Past Medical History:  Diagnosis Date   Anxiety    Back pain    Depression    Hypertension     Patient Active Problem List   Diagnosis Date Noted   Tear of medial cartilage or meniscus of knee, current 03/11/2020    Past Surgical History:  Procedure Laterality Date   CHOLECYSTECTOMY     MOHS SURGERY     to remove benign growth  located in the L low abdomen   R ankle surgery   2016   to repair lateral tendon    VAGINAL HYSTERECTOMY      Prior to Admission medications   Medication Sig Start Date End Date Taking? Authorizing Provider  methocarbamol (ROBAXIN) 500 MG tablet Take 1 tablet (500 mg total) by mouth at bedtime as needed for muscle spasms. 12/18/20  Yes Arsal Tappan, Salvadore Oxford, NP  predniSONE (DELTASONE) 10 MG tablet Take 3 tabs on days 1-3, 2 tabs on days 4-6, 1 tab on days 7-9 12/18/20  Yes Carless Slatten, Salvadore Oxford, NP  aluminum chloride (DRYSOL) 20 % external solution APPLY SMALL AMOUNT TOPICALLY AS DIRECTED APPLY TO CLEAN UNDERARMS AT NIGHT AND WASH OFF IN THE MORNING. USE 2  NIGHTS A WEEK AND IF TOLERATING CAN INCREASE TO EVERY OTHER NIGHT. APPLY TO CLEAN UNDERARMS AT NIGHT AND  WASH OFF IN THE MORNING. USE 2   NIGHTS A WEEK AND IF TOLERATING CAN INCREASE TO EVERY OTHER NIGHT. 12/08/18   [provider]  Benzoyl Peroxide 5 % lotion APPLY SMALL AMOUNT TOPICALLY AS DIRECTED APPLY TO GROIN AREA EVERY OTHER DAY TO DAILY 12/08/18   [provider]  cholecalciferol (VITAMIN D) 400 units TABS tablet Take 1,000 Units by mouth.    [provider]  cyclobenzaprine (FLEXERIL) 5 MG tablet Take 1 tablet (5 mg total) by mouth at bedtime. 08/17/20   Drema Dallas, DO  dicyclomine (BENTYL) 20 MG tablet Take 1 tablet (20 mg total) by mouth every 6 (six) hours as needed. 06/26/17   Irean Hong, MD  dicyclomine (BENTYL) 20 MG tablet dicyclomine 20 mg tablet    [provider]  Dimethicone-Petrolatum 1-30 % CREA APPLY MODERATE AMOUNT TOPICALLY EVERY DAY USE AS NEEDED ON LOWER CHEST TO CONTROL RASH 12/08/18   [provider]  estradiol (ESTRACE) 0.1 MG/GM vaginal cream APPLY ONE GRAM INTO TOPICALLY AS DIRECTED TUESDAYS AND THURSDAYS EXTERNALLY AT BEDTIME  ONLY TO AREAS OF CONCERN 09/05/18   [provider]  famotidine (PEPCID) 20 MG tablet Take 1 tablet (20 mg total) by mouth 2 (two) times daily. 06/26/17   Irean Hong, MD  fluticasone (FLONASE) 50 MCG/ACT nasal spray INSTILL 2  SPRAYS INTO EACH NOSTRIL EVERY DAY MAXIMUM 2 SPRAYS IN EACH NOSTRIL DAILY. FOR ALLERGIES 01/30/19   [provider]  gabapentin (NEURONTIN) 100 MG capsule Take 100 mg by mouth 3 (three) times daily. Patient not taking: Reported on 08/04/2020    [provider]  hydrOXYzine (ATARAX/VISTARIL) 25 MG tablet Take 25 mg by mouth 3 (three) times daily as needed.    [provider]  ibuprofen (ADVIL,MOTRIN) 600 MG tablet Take 1 tablet (600 mg total) by mouth every 6 (six) hours as needed. 07/18/18   Enid Derry, PA-C  ketoconazole (NIZORAL) 2 % shampoo  12/08/18   [provider]  ketorolac (TORADOL) 10 MG tablet Take 1 tablet (10 mg total) by mouth  every 6 (six) hours as needed. 02/25/19   Joni Reining, PA-C  lisinopril (ZESTRIL) 5 MG tablet Take by mouth. 11/10/19   [provider]  meloxicam (MOBIC) 15 MG tablet Take 1 tablet (15 mg total) by mouth daily. 04/26/20   Felecia Shelling, DPM  ondansetron (ZOFRAN-ODT) 4 MG disintegrating tablet ondansetron 4 mg disintegrating tablet    [provider]  pantoprazole (PROTONIX) 40 MG tablet Take 40 mg by mouth daily.    [provider]  polyvinyl alcohol (LIQUIFILM TEARS) 1.4 % ophthalmic solution INSTILL ONE DROP IN [facility] EYE TWO TIMES A DAY FOR DRY EYES 01/30/19   [provider]  Pramoxine-HC 1-1 % OINT  09/05/18   [provider]  topiramate (TOPAMAX) 25 MG tablet Take 1 tablet at bedtime for one week, then increase to 2 tablets at bedtime. 07/05/20   Drema Dallas, DO  traMADol (ULTRAM) 50 MG tablet Take 1 tablet (50 mg total) by mouth every 6 (six) hours as needed. 08/28/17   Triplett, Kasandra Knudsen, FNP  traMADol (ULTRAM) 50 MG tablet     [provider]  triamterene-hydrochlorothiazide (DYAZIDE) 37.5-25 MG capsule Take 1 capsule by mouth daily.    [provider]    Allergies Codeine, Hctz [hydrochlorothiazide], Trimebutine, and Sertraline  Family History  Problem Relation Age of Onset   Diabetes Mother     Social History Social History   Tobacco Use   Smoking status: Never   Smokeless tobacco: Never  Substance Use Topics   Alcohol use: No   Drug use: No    Review of Systems  Constitutional: Negative for fever, chills or body Eyes: Negative for visual changes. Cardiovascular: Negative for chest pain or chest tightness. Respiratory: Negative for cough or shortness of breath. Musculoskeletal: Positive for neck, left shoulder pain and left hand swelling.  Negative for mid to low back pain. Skin: Negative for rash. Neurological: Positive for focal weakness of the left upper extremity.  Negative for headaches, tingling or  numbness. ____________________________________________  PHYSICAL EXAM:  VITAL SIGNS: ED Triage Vitals  Enc Vitals Group     BP 12/18/20 1042 (!) 138/95     Pulse Rate 12/18/20 1042 64     Resp 12/18/20 1042 20     Temp 12/18/20 1042 99.3 F (37.4 C)     Temp Source 12/18/20 1042 Oral     SpO2 12/18/20 1042 98 %     Weight 12/18/20 1040 237 lb (107.5 kg)     Height 12/18/20 1040 5\' 7"  (1.702 m)     Head Circumference --      Peak Flow --      Pain Score 12/18/20 1039 10     Pain Loc --  Pain Edu? --      Excl. in GC? --     Constitutional: Alert and oriented.  Obese, in no distress. Head: Normocephalic. Eyes:  Normal extraocular movements Cardiovascular: Normal rate, regular rhythm.  Respiratory: Normal respiratory effort. No wheezes/rales/rhonchi. Musculoskeletal: Decreased flexion, extension and rotation of the cervical spine secondary to pain.  Decreased external rotation of the left shoulder secondary to pain.  Normal internal rotation of the left shoulder.  Negative drop can test on the left.  She has bony tenderness noted over the cervical spine and of the left paraspinal muscles.  She reports pain with palpation over the left scapula, anterior proximal biceps tendon.  Strength 5/5 BUE.  Handgrips equal.  1+ swelling noted of the left hand. Neurologic: Normal speech and language. No gross focal neurologic deficits are appreciated. Skin:  Skin is warm, dry and intact. No rash noted. ____________________________________________  EKG ED ECG REPORT   Date: 12/18/2020  EKG Time: 1:30 PM  Rate: 68  Rhythm: normal sinus rhythm,  unchanged from previous tracings  Axis: normal  Intervals:none  ST&T Change: none  Narrative Interpretation: NSR, unchanged from prior    ____________________________________________   RADIOLOGY Imaging Orders  DG Shoulder Left  DG Cervical Spine 2-3 Views  IMPRESSION: 1. No acute findings. 2. Mild degenerative change of the glenohumeral  joint.  IMPRESSION: Mild to moderate spondylosis of the cervical spine with multilevel disc disease.  ____________________________________________   INITIAL IMPRESSION / ASSESSMENT AND PLAN / ED COURSE  Acute Neck Pain, Acute Left Shoulder Pain, Left Arm Weakness and Swelling:  DDx include cervical radiculitis, acute neck strain, acute left shoulder strain, tendonitis of left shoulder, bursitis of left shoulder, OA of neck/shoulder. Decadron 10 mg IM x 1 Xray cervical spine shows mild to moderate spondylosis with multilevel degenerative disc disease Xray left shoulder shows mild degenerative changes at the glenohumeral joint Xray for Pred Taper x 9 days (unable to take NSAID's due to history of cycstic kidney disease) Xray for Methocarbamol 500 mg QHS  prn- sedation caution given Shoulder and neck exercises given [facility] persist or worsen. ____________________________________________  FINAL CLINICAL IMPRESSION(S) / ED DIAGNOSES  Final diagnoses:  Primary osteoarthritis of left shoulder  Spondylosis of cervical region without myelopathy or radiculopathy      Lorre Munroe, NP 12/18/20 1330    Concha Se, MD 12/19/20 1059

## 2020-12-20 ENCOUNTER — Telehealth: Payer: Self-pay | Admitting: Neurology

## 2020-12-21 ENCOUNTER — Other Ambulatory Visit: Payer: Self-pay | Admitting: Surgery

## 2020-12-21 DIAGNOSIS — S83231A Complex tear of medial meniscus, current injury, right knee, initial encounter: Secondary | ICD-10-CM

## 2021-01-02 ENCOUNTER — Ambulatory Visit: Payer: No Typology Code available for payment source | Admitting: Neurology

## 2021-01-03 ENCOUNTER — Other Ambulatory Visit: Payer: Self-pay

## 2021-01-03 ENCOUNTER — Ambulatory Visit
Admission: RE | Admit: 2021-01-03 | Discharge: 2021-01-03 | Disposition: A | Discharge status: Home / Self Care | Payer: No Typology Code available for payment source | Source: Ambulatory Visit | Attending: Surgery | Admitting: Surgery

## 2021-01-03 DIAGNOSIS — S83231A Complex tear of medial meniscus, current injury, right knee, initial encounter: Secondary | ICD-10-CM | POA: Diagnosis present

## 2021-01-03 NOTE — Telephone Encounter (Signed)
Miss Vanderslice came by the office and dropped by a referral [facility] needs [facility] for her to have [facility].   Placed in Dr. Moises Blood inbox.

## 2021-01-05 NOTE — Telephone Encounter (Signed)
Form filled out and fax over to [employer]; [phone]

## 2021-01-05 NOTE — Telephone Encounter (Signed)
Pt advised.

## 2021-02-21 ENCOUNTER — Other Ambulatory Visit: Payer: Self-pay

## 2021-02-21 ENCOUNTER — Encounter: Payer: Self-pay | Admitting: Neurology

## 2021-02-21 ENCOUNTER — Ambulatory Visit (INDEPENDENT_AMBULATORY_CARE_PROVIDER_SITE_OTHER): Payer: No Typology Code available for payment source | Admitting: Neurology

## 2021-02-21 VITALS — BP 125/75 | HR 59 | Ht 67.0 in | Wt 246.6 lb

## 2021-02-21 DIAGNOSIS — M542 Cervicalgia: Secondary | ICD-10-CM

## 2021-02-21 DIAGNOSIS — G4486 Cervicogenic headache: Secondary | ICD-10-CM

## 2021-02-21 MED ORDER — NORTRIPTYLINE HCL 10 MG PO CAPS: 10.0000 mg | ORAL_CAPSULE | Freq: Every day | ORAL | 5 refills | Status: DC

## 2021-02-21 NOTE — Patient Instructions (Addendum)
Start taking the cyclobenzaprine 5mg  every evening Refer to physical therapy for neck pain Start nortriptyline 10mg  at bedtime.  We can increase dose in 6 weeks if needed. Follow up 6 months.

## 2021-02-21 NOTE — Progress Notes (Signed)
NEUROLOGY FOLLOW UP OFFICE NOTE  [name] [id number]  Assessment/Plan:   Migraine without aura, without status migrainosus, not intractable Cervicalgia, cervical radiculopathy  I have given a copy of the form for physical therapy to the patient so that she can personally give it to the [place]. Flexeril 5mg  at bedtime Start nortriptyline 10mg  at bedtime.   We can increase dose in 6 weeks if needed. Limit use of pain relievers to no more than 2 days out of week to prevent risk of rebound or medication-overuse headache. Follow up 6 months.   Subjective:  . Carolyn Gregory is a 60 year old right-handed female with back pain, HTN and depression/anxiety who follows up for headache and neck pain.   UPDATE: In January, she endorsed neck pain beginning in the upper cervical region bilaterally and radiating down the neck.  Flexion of neck aggravates it.  Sometimes her left upper arm may jerk.  She was prescribed Flexeril and physical therapy.  [facility] not yet [facility] because they said they have not received the form even though we have faxed it.  She reports increased muscle spasms in her neck.  Flexeril does help for a little bit.  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  Stopped topiramate because it upset her stomach.  Headaches occur a couple of times a week, mostly the neck and suboccipital pain.   Current NSAIDS/analgesics:  none Current triptans:  none Current ergotamine:  none Current anti-emetic:  none Current muscle relaxants:  Flexeril 5mg  QHS Current Antihypertensive medications:  lisinopril Current Antidepressant medications:  none Current Anticonvulsant medications:   Current anti-CGRP:  none Current Vitamins/Herbal/Supplements:  Magnesium oxide 400mg  TID Current Antihistamines/Decongestants:  Cetirizine  Other therapy:  none Hormone/birth control:  Estradiol   Caffeine:   No coffee.  Tea infrequently.  No soda Diet:  Keeps hydrated.  Skips meals. No soda Exercise:  walks Depression:  yes; Anxiety:  Yes, gotten worse. Other pain:  Chronic pain in back, knee Sleep hygiene:  Trouble sleeping. Related to stress.    HISTORY:  She has had migraines since age 60.  They are typically a 4-5/10 non-throbbing pressure across the forehead with nausea, photophobia and phonophobia but they have changed around 2018 associated with elevated blood pressure and her chronic pain.  She developed new severe headaches in December 2021,. a 10/10 pressure over the top of her head. It lasted for several hours.  Associated nausea, photophobia and phonophobia, black floaters in vision, dyspnea and increased sound of her tinnitus but no vomiting.  She couldn't rest.  They have been occurring 3 days a week since onset. She reports similar episode in 2019 related to stress.  Blood pressure has been elevated, reportedly 160s systolic.  MRI of brain with and without contrast and MRA of head on 07/22/2020 were normal.   Past NSAIDS/analgesics:  Codeine (side effect), ibuprofen, ketorolac, Mobic, tramadol Past abortive triptans:  none Past abortive ergotamine:  none Past muscle relaxants:  Robaxin, Flexeril Past anti-emetic:  Zofran ODT 4mg  Past antihypertensive medications:  none Past antidepressant medications:  Sertraline, Wellbutrin Past anticonvulsant medications:  gabapentin, topiramate (upset her stomach) Past anti-CGRP:  none Past vitamins/Herbal/Supplements:  none Past antihistamines/decongestants:  none Other past therapies:  none     Family history of headache:  She is not aware.  PAST MEDICAL HISTORY: Past Medical History:  Diagnosis Date   Anxiety    Back pain  Depression    Hypertension     MEDICATIONS: Current Outpatient Medications on File Prior to Visit  Medication Sig Dispense Refill   aluminum chloride (DRYSOL) 20 % external solution APPLY SMALL AMOUNT  TOPICALLY AS DIRECTED APPLY TO CLEAN UNDERARMS AT NIGHT AND WASH OFF IN THE MORNING. USE 2  NIGHTS A WEEK AND IF TOLERATING CAN INCREASE TO EVERY OTHER NIGHT. APPLY TO CLEAN UNDERARMS AT NIGHT AND WASH OFF IN THE MORNING. USE 2   NIGHTS A WEEK AND IF TOLERATING CAN INCREASE TO EVERY OTHER NIGHT.     Benzoyl Peroxide 5 % lotion APPLY SMALL AMOUNT TOPICALLY AS DIRECTED APPLY TO GROIN AREA EVERY OTHER DAY TO DAILY     cholecalciferol (VITAMIN D) 400 units TABS tablet Take 1,000 Units by mouth.     cyclobenzaprine (FLEXERIL) 5 MG tablet Take 1 tablet (5 mg total) by mouth at bedtime. 30 tablet 5   dicyclomine (BENTYL) 20 MG tablet Take 1 tablet (20 mg total) by mouth every 6 (six) hours as needed. 20 tablet 0   dicyclomine (BENTYL) 20 MG tablet dicyclomine 20 mg tablet     Dimethicone-Petrolatum 1-30 % CREA APPLY MODERATE AMOUNT TOPICALLY EVERY DAY USE AS NEEDED ON LOWER CHEST TO CONTROL RASH     estradiol (ESTRACE) 0.1 MG/GM vaginal cream APPLY ONE GRAM INTO TOPICALLY AS DIRECTED TUESDAYS AND THURSDAYS EXTERNALLY AT BEDTIME  ONLY TO AREAS OF CONCERN     famotidine (PEPCID) 20 MG tablet Take 1 tablet (20 mg total) by mouth 2 (two) times daily. 60 tablet 0   fluticasone (FLONASE) 50 MCG/ACT nasal spray INSTILL 2 SPRAYS INTO EACH NOSTRIL EVERY DAY MAXIMUM 2 SPRAYS IN EACH NOSTRIL DAILY. FOR ALLERGIES     gabapentin (NEURONTIN) 100 MG capsule Take 100 mg by mouth 3 (three) times daily. (Patient not taking: Reported on 08/04/2020)     hydrOXYzine (ATARAX/VISTARIL) 25 MG tablet Take 25 mg by mouth 3 (three) times daily as needed.     ibuprofen (ADVIL,MOTRIN) 600 MG tablet Take 1 tablet (600 mg total) by mouth every 6 (six) hours as needed. 30 tablet 0   ketoconazole (NIZORAL) 2 % shampoo      ketorolac (TORADOL) 10 MG tablet Take 1 tablet (10 mg total) by mouth every 6 (six) hours as needed. 20 tablet 0   lisinopril (ZESTRIL) 5 MG tablet Take by mouth.     meloxicam (MOBIC) 15 MG tablet Take 1 tablet (15 mg  total) by mouth daily. 30 tablet 0   methocarbamol (ROBAXIN) 500 MG tablet Take 1 tablet (500 mg total) by mouth at bedtime as needed for muscle spasms. 15 tablet 0   ondansetron (ZOFRAN-ODT) 4 MG disintegrating tablet ondansetron 4 mg disintegrating tablet     pantoprazole (PROTONIX) 40 MG tablet Take 40 mg by mouth daily.     polyvinyl alcohol (LIQUIFILM TEARS) 1.4 % ophthalmic solution INSTILL ONE DROP IN EACH EYE TWO TIMES A DAY FOR DRY EYES     Pramoxine-HC 1-1 % OINT      predniSONE (DELTASONE) 10 MG tablet Take 3 tabs on days 1-3, 2 tabs on days 4-6, 1 tab on days 7-9 18 tablet 0   topiramate (TOPAMAX) 25 MG tablet Take 1 tablet at bedtime for one week, then increase to 2 tablets at bedtime. 60 tablet 0   traMADol (ULTRAM) 50 MG tablet Take 1 tablet (50 mg total) by mouth every 6 (six) hours as needed. 12 tablet 0   traMADol (ULTRAM) 50 MG tablet  triamterene-hydrochlorothiazide (DYAZIDE) 37.5-25 MG capsule Take 1 capsule by mouth daily.     No current facility-administered medications on file prior to visit.    ALLERGIES: Allergies  Allergen Reactions   Codeine Other (See Comments)   Hctz [Hydrochlorothiazide] Other (See Comments)    Causes her potassium to drop   Trimebutine    Sertraline Anxiety    FAMILY HISTORY: Family History  Problem Relation Age of Onset   Diabetes Mother       Objective:  Blood pressure 125/75, pulse (!) 59, height 5\' 7"  (1.702 m), weight 246 lb 9.6 oz (111.9 kg), SpO2 98 %. General: No acute distress.  Patient appears well-groomed.     , DO  CC: Shon Millet, MD

## 2021-05-13 ENCOUNTER — Emergency Department: Payer: Non-veteran care

## 2021-05-13 ENCOUNTER — Encounter: Payer: Self-pay | Admitting: Emergency Medicine

## 2021-05-13 ENCOUNTER — Other Ambulatory Visit: Payer: Self-pay

## 2021-05-13 ENCOUNTER — Emergency Department
Admission: EM | Admit: 2021-05-13 | Discharge: 2021-05-13 | Disposition: A | Discharge status: Home / Self Care | Payer: Non-veteran care | Attending: Emergency Medicine | Admitting: Emergency Medicine

## 2021-05-13 DIAGNOSIS — I10 Essential (primary) hypertension: Secondary | ICD-10-CM | POA: Insufficient documentation

## 2021-05-13 DIAGNOSIS — U071 COVID-19: Secondary | ICD-10-CM | POA: Insufficient documentation

## 2021-05-13 DIAGNOSIS — R059 Cough, unspecified: Secondary | ICD-10-CM | POA: Diagnosis present

## 2021-05-13 DIAGNOSIS — Z79899 Other long term (current) drug therapy: Secondary | ICD-10-CM | POA: Diagnosis not present

## 2021-05-13 LAB — RESP PANEL BY RT-PCR (FLU A&B, COVID) ARPGX2
Influenza A by PCR: NEGATIVE
Influenza B by PCR: NEGATIVE
SARS Coronavirus 2 by RT PCR: POSITIVE — AB

## 2021-05-13 NOTE — ED Provider Notes (Signed)
[facility] Emergency Department Provider Note  ____________________________________________   Event Date/Time   First MD Initiated Contact with Patient 05/13/21 1159     (approximate)  I have reviewed the triage vital signs and the nursing notes.   HISTORY  Chief Complaint Cough    HPI BERNARDINE LANGWORTHY is a 60 y.o. female presents emergency department with COVID-like symptoms for 3 days.  States she has had a fever, cough and congestion.  Her daughter-in-law tested positive for COVID.  She is not vaccinated but has she has underlying health problems and did not think it would be appropriate for her.  Past Medical History:  Diagnosis Date   Anxiety    Back pain    Depression    Hypertension     Patient Active Problem List   Diagnosis Date Noted   Tear of medial cartilage or meniscus of knee, current 03/11/2020    Past Surgical History:  Procedure Laterality Date   CHOLECYSTECTOMY     MOHS SURGERY     to remove benign growth  located in the L low abdomen   R ankle surgery   2016   to repair lateral tendon    VAGINAL HYSTERECTOMY      Prior to Admission medications   Medication Sig Start Date End Date Taking? Authorizing Provider  aluminum chloride (DRYSOL) 20 % external solution APPLY SMALL AMOUNT TOPICALLY AS DIRECTED APPLY TO CLEAN UNDERARMS AT NIGHT AND WASH OFF IN THE MORNING. USE 2  NIGHTS A WEEK AND IF TOLERATING CAN INCREASE TO EVERY OTHER NIGHT. APPLY TO CLEAN UNDERARMS AT NIGHT AND WASH OFF IN THE MORNING. USE 2   NIGHTS A WEEK AND IF TOLERATING CAN INCREASE TO EVERY OTHER NIGHT. 12/08/18   [provider]  Benzoyl Peroxide 5 % lotion APPLY SMALL AMOUNT TOPICALLY AS DIRECTED APPLY TO GROIN AREA EVERY OTHER DAY TO DAILY 12/08/18   [provider]  cholecalciferol (VITAMIN D) 400 units TABS tablet Take 1,000 Units by mouth.    [provider]  cyclobenzaprine (FLEXERIL) 5 MG tablet Take 1 tablet (5 mg total) by  mouth at bedtime. 08/17/20   Jaffe, Adam R, DO  Dimethicone-Petrolatum 1-30 % CREA APPLY MODERATE AMOUNT TOPICALLY EVERY DAY USE AS NEEDED ON LOWER CHEST TO CONTROL RASH 12/08/18   [provider]  estradiol (ESTRACE) 0.1 MG/GM vaginal cream APPLY ONE GRAM INTO TOPICALLY AS DIRECTED TUESDAYS AND THURSDAYS EXTERNALLY AT BEDTIME  ONLY TO AREAS OF CONCERN 09/05/18   [provider]  fluticasone (FLONASE) 50 MCG/ACT nasal spray INSTILL 2 SPRAYS INTO EACH NOSTRIL EVERY DAY MAXIMUM 2 SPRAYS IN EACH NOSTRIL DAILY. FOR ALLERGIES 01/30/19   [provider]  ketoconazole (NIZORAL) 2 % shampoo  12/08/18   [provider]  lisinopril (ZESTRIL) 40 MG tablet Take 40 mg by mouth. 11/10/19   [provider]  methocarbamol (ROBAXIN) 500 MG tablet Take 1 tablet (500 mg total) by mouth at bedtime as needed for muscle spasms. 12/18/20   Lorre Munroe, NP  nortriptyline (PAMELOR) 10 MG capsule Take 1 capsule (10 mg total) by mouth at bedtime. 02/21/21   Drema Dallas, DO  polyvinyl alcohol (LIQUIFILM TEARS) 1.4 % ophthalmic solution INSTILL ONE DROP IN [facility] EYE TWO TIMES A DAY FOR DRY EYES 01/30/19   [provider]    Allergies Codeine, Hctz [hydrochlorothiazide], Trimebutine, and Sertraline  Family History  Problem Relation Age of Onset   Diabetes Mother     Social  History Social History   Tobacco Use   Smoking status: Never   Smokeless tobacco: Never  Substance Use Topics   Alcohol use: No   Drug use: No    Review of Systems  Constitutional: Positive fever/chills Eyes: No visual changes. ENT: No sore throat. Respiratory: Positive cough Cardiovascular: Denies chest pain Gastrointestinal: Denies abdominal pain Genitourinary: Negative for dysuria. Musculoskeletal: Negative for back pain. Skin: Negative for rash. Psychiatric: no mood changes,     ____________________________________________   PHYSICAL EXAM:  VITAL SIGNS: ED Triage Vitals  Enc  Vitals Group     BP 05/13/21 1157 126/89     Pulse Rate 05/13/21 1157 73     Resp 05/13/21 1157 16     Temp 05/13/21 1200 98.3 F (36.8 C)     Temp Source 05/13/21 1200 Oral     SpO2 05/13/21 1157 96 %     Weight 05/13/21 1155 239 lb (108.4 kg)     Height 05/13/21 1155 5\' 7"  (1.702 m)     Head Circumference --      Peak Flow --      Pain Score 05/13/21 1155 7     Pain Loc --      Pain Edu? --      Excl. in GC? --     Constitutional: Alert and oriented. Well appearing and in no acute distress. Eyes: Conjunctivae are normal.  Head: Atraumatic. Nose: No congestion/rhinnorhea. Mouth/Throat: Mucous membranes are moist.   Neck:  supple no lymphadenopathy noted Cardiovascular: Normal rate, regular rhythm. Heart sounds are normal Respiratory: Normal respiratory effort.  No retractions, lungs c t a  GU: deferred Musculoskeletal: FROM all extremities, warm and well perfused Neurologic:  Normal speech and language.  Skin:  Skin is warm, dry and intact. No rash noted. Psychiatric: Mood and affect are normal. Speech and behavior are normal.  ____________________________________________   LABS (all labs ordered are listed, but only abnormal results are displayed)  Labs Reviewed  RESP PANEL BY RT-PCR (FLU A&B, COVID) ARPGX2 - Abnormal; Notable for the following components:      Result Value   SARS Coronavirus 2 by RT PCR POSITIVE (*)    All other components within normal limits   ____________________________________________   ____________________________________________  RADIOLOGY  Chest x-ray  ____________________________________________   PROCEDURES  Procedure(s) performed: No  Procedures    ____________________________________________   INITIAL IMPRESSION / ASSESSMENT AND PLAN / ED COURSE  Pertinent labs & imaging results that were available during my care of the patient were reviewed by me and considered in my medical decision making (see chart for details).    Patient presents with COVID-like symptoms.  See HPI.  Physical exam shows patient be stable.  Chest x-ray reviewed by me confirmed by radiology to be negative  COVID test is positive,  I did discuss medication with patient.  She still does not feel that the paxlovid would be appropriate.  She is taking over-the-counter medications.  Return emergency department worsening.  Discharged stable condition.     CORRIE REDER was evaluated in Emergency Department on 05/13/2021 for the symptoms described in the history of present illness. She was evaluated in the context of the global COVID-19 pandemic, which necessitated consideration that the patient might be at risk for infection with the SARS-CoV-2 virus that causes COVID-19. Institutional protocols and algorithms that pertain to the evaluation of patients at risk for COVID-19 are in a state of rapid change based on information released by regulatory bodies  including the CDC and federal and state organizations. These policies and algorithms were followed during the patient's care in the ED.    As part of my medical decision making, I reviewed the following data within the electronic MEDICAL RECORD NUMBER Nursing notes reviewed and incorporated, Labs reviewed , Old chart reviewed, Radiograph reviewed , Notes from prior ED visits, and [place] Controlled Substance Database  ____________________________________________   FINAL CLINICAL IMPRESSION(S) / ED DIAGNOSES  Final diagnoses:  COVID-19      NEW MEDICATIONS STARTED DURING THIS VISIT:  Discharge Medication List as of 05/13/2021  1:29 PM       Note:  This document was prepared using Dragon voice recognition software and may include unintentional dictation errors.    Faythe Ghee, PA-C 05/13/21 1536    Merwyn Katos, MD 05/13/21 [phone]

## 2021-05-13 NOTE — Discharge Instructions (Signed)

## 2021-05-13 NOTE — ED Triage Notes (Signed)
Pt to ED via POV, pt states that she is having a deep cough. Pt states that she has been having symptoms for about 3 days. Pt states that her chest and throat hurt when she coughs. Pt reports positive exposure to COVID but she tested negative on 11/28. Pt states that she was not having symptoms at this time  Pt also reports some chills, no fever but states that she did wake up a few times and had been sweating. Pt is in NAD.

## 2021-05-13 NOTE — ED Provider Notes (Signed)
Emergency Medicine Provider Triage Evaluation Note  Carolyn Gregory , a 60 y.o. female  was evaluated in triage.  Pt complains of presents emergency department with complaints of cough and COVID exposure.  Review of Systems  Positive: Cough, sore throat Negative: No fever, chills, chest pain or shortness of breath  Physical Exam  BP 126/89 (BP Location: Left Arm)   Pulse 73   Resp 16   Ht 5\' 7"  (1.702 m)   Wt 108.4 kg   SpO2 96%   BMI 37.43 kg/m  Gen:   Awake, no distress   Resp:  Normal effort  MSK:   Moves extremities without difficulty  Other:    Medical Decision Making  Medically screening exam initiated at 12:04 PM.  Appropriate orders placed.  Carolyn Gregory was informed that the remainder of the evaluation will be completed by another provider, this initial triage assessment does not replace that evaluation, and the importance of remaining in the ED until their evaluation is complete.  Patient positive COVID exposure.  Seen here on 10/28 tested negative, had just been exposed today 4.  I did offer to discharge patient from triage.  She wants to wait to be seen.   11/28, PA-C 05/13/21 1205    13/05/22, MD 05/13/21 [phone]

## 2021-05-22 ENCOUNTER — Encounter: Payer: Self-pay | Admitting: Emergency Medicine

## 2021-05-22 ENCOUNTER — Emergency Department: Payer: No Typology Code available for payment source

## 2021-05-22 ENCOUNTER — Other Ambulatory Visit: Payer: Self-pay

## 2021-05-22 ENCOUNTER — Emergency Department
Admission: EM | Admit: 2021-05-22 | Discharge: 2021-05-22 | Disposition: A | Discharge status: Home / Self Care | Payer: No Typology Code available for payment source | Attending: Emergency Medicine | Admitting: Emergency Medicine

## 2021-05-22 DIAGNOSIS — X501XXA Overexertion from prolonged static or awkward postures, initial encounter: Secondary | ICD-10-CM | POA: Diagnosis not present

## 2021-05-22 DIAGNOSIS — I10 Essential (primary) hypertension: Secondary | ICD-10-CM | POA: Insufficient documentation

## 2021-05-22 DIAGNOSIS — S93401A Sprain of unspecified ligament of right ankle, initial encounter: Secondary | ICD-10-CM | POA: Diagnosis not present

## 2021-05-22 DIAGNOSIS — S99911A Unspecified injury of right ankle, initial encounter: Secondary | ICD-10-CM | POA: Diagnosis present

## 2021-05-22 MED ORDER — MELOXICAM 15 MG PO TABS: 15.0000 mg | ORAL_TABLET | Freq: Every day | ORAL | 2 refills | Status: DC

## 2021-05-22 MED ORDER — IBUPROFEN 600 MG PO TABS
600.0000 mg | ORAL_TABLET | Freq: Once | ORAL | Status: DC
  Filled 2021-05-22: qty 1

## 2021-05-22 NOTE — ED Notes (Signed)
See triage note  presents with injury to right ankle  states she stepped wrong yesterday

## 2021-05-22 NOTE — Discharge Instructions (Signed)
Elevate and ice your ankle.  Take the meloxicam daily.  Bear weight as tolerated.  Wear the boot until he can bear weight without difficulty.

## 2021-05-22 NOTE — ED Triage Notes (Signed)
C/O stepping down onto right ankle yesterday and injuring ankle. C/O swelling.  Ice used over night.  Patient is ambulatory.

## 2021-05-22 NOTE — ED Provider Notes (Signed)
[facility] Emergency Department Provider Note  ____________________________________________   Event Date/Time   First MD Initiated Contact with Patient 05/22/21 [phone]     (approximate)  I have reviewed the triage vital signs and the nursing notes.   HISTORY  Chief Complaint Ankle Pain    HPI Carolyn Gregory is a 60 y.o. female presents emergency department complaint of right ankle injury.  States she stepped wrong and twisted the ankle yesterday.  Continues to have ankle pain.  Pain is more on the medial aspect.  No numbness or tingling.  Painful to bear weight.  Past Medical History:  Diagnosis Date   Anxiety    Back pain    Depression    Hypertension     Patient Active Problem List   Diagnosis Date Noted   Tear of medial cartilage or meniscus of knee, current 03/11/2020    Past Surgical History:  Procedure Laterality Date   CHOLECYSTECTOMY     MOHS SURGERY     to remove benign growth  located in the L low abdomen   R ankle surgery   2016   to repair lateral tendon    VAGINAL HYSTERECTOMY      Prior to Admission medications   Medication Sig Start Date End Date Taking? Authorizing Provider  meloxicam (MOBIC) 15 MG tablet Take 1 tablet (15 mg total) by mouth daily. 05/22/21 05/22/22 Yes Audryanna Zurita, Roselyn Bering, PA-C  aluminum chloride (DRYSOL) 20 % external solution APPLY SMALL AMOUNT TOPICALLY AS DIRECTED APPLY TO CLEAN UNDERARMS AT NIGHT AND WASH OFF IN THE MORNING. USE 2  NIGHTS A WEEK AND IF TOLERATING CAN INCREASE TO EVERY OTHER NIGHT. APPLY TO CLEAN UNDERARMS AT NIGHT AND WASH OFF IN THE MORNING. USE 2   NIGHTS A WEEK AND IF TOLERATING CAN INCREASE TO EVERY OTHER NIGHT. 12/08/18   [provider]  Benzoyl Peroxide 5 % lotion APPLY SMALL AMOUNT TOPICALLY AS DIRECTED APPLY TO GROIN AREA EVERY OTHER DAY TO DAILY 12/08/18   [provider]  cholecalciferol (VITAMIN D) 400 units TABS tablet Take 1,000 Units by mouth.    [provider]  cyclobenzaprine (FLEXERIL) 5 MG tablet Take 1 tablet (5 mg total) by mouth at bedtime. 08/17/20   Jaffe, Adam R, DO  Dimethicone-Petrolatum 1-30 % CREA APPLY MODERATE AMOUNT TOPICALLY EVERY DAY USE AS NEEDED ON LOWER CHEST TO CONTROL RASH 12/08/18   [provider]  estradiol (ESTRACE) 0.1 MG/GM vaginal cream APPLY ONE GRAM INTO TOPICALLY AS DIRECTED TUESDAYS AND THURSDAYS EXTERNALLY AT BEDTIME  ONLY TO AREAS OF CONCERN 09/05/18   [provider]  fluticasone (FLONASE) 50 MCG/ACT nasal spray INSTILL 2 SPRAYS INTO EACH NOSTRIL EVERY DAY MAXIMUM 2 SPRAYS IN EACH NOSTRIL DAILY. FOR ALLERGIES 01/30/19   [provider]  ketoconazole (NIZORAL) 2 % shampoo  12/08/18   [provider]  lisinopril (ZESTRIL) 40 MG tablet Take 40 mg by mouth. 11/10/19   [provider]  methocarbamol (ROBAXIN) 500 MG tablet Take 1 tablet (500 mg total) by mouth at bedtime as needed for muscle spasms. 12/18/20   Lorre Munroe, NP  nortriptyline (PAMELOR) 10 MG capsule Take 1 capsule (10 mg total) by mouth at bedtime. 02/21/21   Drema Dallas, DO  polyvinyl alcohol (LIQUIFILM TEARS) 1.4 % ophthalmic solution INSTILL ONE DROP IN [facility] EYE TWO TIMES A DAY FOR DRY EYES 01/30/19   [provider]    Allergies Codeine, Hctz [hydrochlorothiazide], Trimebutine, and Sertraline  Family History  Problem Relation Age of Onset   Diabetes Mother     Social History Social History   Tobacco Use   Smoking status: Never   Smokeless tobacco: Never  Substance Use Topics   Alcohol use: No   Drug use: No    Review of Systems  Constitutional: No fever/chills Eyes: No visual changes. ENT: No sore throat. Respiratory: Denies cough Cardiovascular: Denies chest pain Gastrointestinal: Denies abdominal pain Genitourinary: Negative for dysuria. Musculoskeletal: Negative for back pain. Skin: Negative for rash. Psychiatric: no mood changes,      ____________________________________________   PHYSICAL EXAM:  VITAL SIGNS: ED Triage Vitals  Enc Vitals Group     BP 05/22/21 0713 (!) 138/95     Pulse Rate 05/22/21 0713 63     Resp 05/22/21 0711 16     Temp 05/22/21 0711 98.3 F (36.8 C)     Temp Source 05/22/21 0711 Oral     SpO2 05/22/21 0711 98 %     Weight 05/22/21 0712 238 lb 15.7 oz (108.4 kg)     Height 05/22/21 0712 5\' 7"  (1.702 m)     Head Circumference --      Peak Flow --      Pain Score 05/22/21 0711 10     Pain Loc --      Pain Edu? --      Excl. in GC? --     Constitutional: Alert and oriented. Well appearing and in no acute distress. Eyes: Conjunctivae are normal.  Head: Atraumatic. Nose: No congestion/rhinnorhea. Mouth/Throat: Mucous membranes are moist.   Neck:  supple no lymphadenopathy noted Cardiovascular: Normal rate, regular rhythm.  Respiratory: Normal respiratory effort.  No retractions GU: deferred Musculoskeletal: FROM all extremities, warm and well perfused, right ankle tenderness on the medial aspect, neurovascular intact, minimal swelling noted Neurologic:  Normal speech and language.  Skin:  Skin is warm, dry and intact. No rash noted. Psychiatric: Mood and affect are normal. Speech and behavior are normal.  ____________________________________________   LABS (all labs ordered are listed, but only abnormal results are displayed)  Labs Reviewed - No data to display ____________________________________________   ____________________________________________  RADIOLOGY  X-ray of the right ankle  ____________________________________________   PROCEDURES  Procedure(s) performed: Short cam walker applied  Procedures    ____________________________________________   INITIAL IMPRESSION / ASSESSMENT AND PLAN / ED COURSE  Pertinent labs & imaging results that were available during my care of the patient were reviewed by me and considered in my medical decision making  (see chart for details).   Patient's 61 year old female presents with right ankle pain.  See HPI.  Physical exam shows patient per stable  X-ray of the right ankle reviewed by me confirmed by radiology to be negative  Patient was placed in a short cam walker.  Given ibuprofen here in the ED.  She is to follow-up with her regular doctor if not improving in 5 to 7 days.  Orthopedics if worsening.  Discharged in stable condition.     Carolyn Gregory was evaluated in Emergency Department on 05/22/2021 for the symptoms described in the history of present illness. She was evaluated in the context of the global COVID-19 pandemic, which necessitated consideration that the patient might be at risk for infection with the SARS-CoV-2 virus that causes COVID-19. Institutional protocols and algorithms that pertain to the evaluation of patients at risk for COVID-19 are in a state of rapid change based on information released by regulatory bodies  including the CDC and federal and state organizations. These policies and algorithms were followed during the patient's care in the ED.    As part of my medical decision making, I reviewed the following data within the electronic MEDICAL RECORD NUMBER Nursing notes reviewed and incorporated, Old chart reviewed, Radiograph reviewed , Notes from prior ED visits, and [place] Controlled Substance Database  ____________________________________________   FINAL CLINICAL IMPRESSION(S) / ED DIAGNOSES  Final diagnoses:  Sprain of right ankle, unspecified ligament, initial encounter      NEW MEDICATIONS STARTED DURING THIS VISIT:  New Prescriptions   MELOXICAM (MOBIC) 15 MG TABLET    Take 1 tablet (15 mg total) by mouth daily.     Note:  This document was prepared using Dragon voice recognition software and may include unintentional dictation errors.    Faythe Ghee, PA-C 05/22/21 [id number]    Gilles Chiquito, MD 05/22/21 1536

## 2021-07-04 ENCOUNTER — Ambulatory Visit: Payer: No Typology Code available for payment source | Admitting: Neurology

## 2021-08-23 NOTE — Progress Notes (Signed)
Referral faxed over to the [facility] [place] for Auth for extra visit with DR.Jaffe. Appt 09/01/21

## 2021-08-30 NOTE — Progress Notes (Unsigned)
NEUROLOGY FOLLOW UP OFFICE NOTE  [name] [id number]  Assessment/Plan:   Migraine without aura, without status migrainosus, not intractable Cervicalgia, cervical radiculopathy   I have given a copy of the form for physical therapy to the patient so that she can personally give it to the [place]. Flexeril 5mg  at bedtime Start nortriptyline 10mg  at bedtime.   We can increase dose in 6 weeks if needed. Limit use of pain relievers to no more than 2 days out of week to prevent risk of rebound or medication-overuse headache. Follow up 6 months.     Subjective:  . Carolyn Gregory is a 61 year old right-handed female with back pain, HTN and depression/anxiety who follows up for headache and neck pain.   UPDATE: Recommended physical therapy for neck pain.  Also started on nortriptyline in August.   ***   Current NSAIDS/analgesics:  none Current triptans:  none Current ergotamine:  none Current anti-emetic:  none Current muscle relaxants:  Flexeril 5mg  QHS Current Antihypertensive medications:  lisinopril Current Antidepressant medications:  nortriptyline 10mg  at bedtime Current Anticonvulsant medications:   Current anti-CGRP:  none Current Vitamins/Herbal/Supplements:  Magnesium oxide 400mg  TID Current Antihistamines/Decongestants:  Cetirizine  Other therapy:  none Hormone/birth control:  Estradiol   Caffeine:  No coffee.  Tea infrequently.  No soda Diet:  Keeps hydrated.  Skips meals. No soda Exercise:  walks Depression:  yes; Anxiety:  Yes, gotten worse. Other pain:  Chronic pain in back, knee Sleep hygiene:  Trouble sleeping. Related to stress.    HISTORY:  She has had migraines since age 29.  They are typically a 4-5/10 non-throbbing pressure across the forehead with nausea, photophobia and phonophobia but they have changed around 2018 associated with elevated blood pressure and her chronic pain.  She developed new severe headaches in December 2021,. a 10/10 pressure  over the top of her head. It lasted for several hours.  Associated nausea, photophobia and phonophobia, black floaters in vision, dyspnea and increased sound of her tinnitus but no vomiting.  She couldn't rest.  They have been occurring 3 days a week since onset. She reports similar episode in 2019 related to stress.  Blood pressure has been elevated, reportedly 160s systolic.  MRI of brain with and without contrast and MRA of head on 07/22/2020 were normal.  She also endorsed neck pain beginning in the upper cervical region bilaterally and radiating down the neck.  Flexion of neck aggravates it.  Sometimes her left upper arm may jerk.  She was prescribed Flexeril and physical therapy.  [facility] because they said they have not received the form even though we have faxed it.  She reports increased muscle spasms in her neck.  Flexeril does help for a little bit.  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  Stopped topiramate because it upset her stomach.  Headaches occur a couple of times a week, mostly the neck and suboccipital pain.   Past NSAIDS/analgesics:  Codeine (side effect), ibuprofen, ketorolac, Mobic, tramadol Past abortive triptans:  none Past abortive ergotamine:  none Past muscle relaxants:  Robaxin, Flexeril Past anti-emetic:  Zofran ODT 4mg  Past antihypertensive medications:  none Past antidepressant medications:  Sertraline, Wellbutrin Past anticonvulsant medications:  gabapentin, topiramate (upset her stomach) Past anti-CGRP:  none Past vitamins/Herbal/Supplements:  none Past antihistamines/decongestants:  none Other past therapies:  none     Family history of headache:  She  is not aware.  PAST MEDICAL HISTORY: Past Medical History:  Diagnosis Date   Anxiety    Back pain    Depression    Hypertension     MEDICATIONS: Current Outpatient Medications on File Prior  to Visit  Medication Sig Dispense Refill   aluminum chloride (DRYSOL) 20 % external solution APPLY SMALL AMOUNT TOPICALLY AS DIRECTED APPLY TO CLEAN UNDERARMS AT NIGHT AND WASH OFF IN THE MORNING. USE 2  NIGHTS A WEEK AND IF TOLERATING CAN INCREASE TO EVERY OTHER NIGHT. APPLY TO CLEAN UNDERARMS AT NIGHT AND WASH OFF IN THE MORNING. USE 2   NIGHTS A WEEK AND IF TOLERATING CAN INCREASE TO EVERY OTHER NIGHT.     Benzoyl Peroxide 5 % lotion APPLY SMALL AMOUNT TOPICALLY AS DIRECTED APPLY TO GROIN AREA EVERY OTHER DAY TO DAILY     cholecalciferol (VITAMIN D) 400 units TABS tablet Take 1,000 Units by mouth.     cyclobenzaprine (FLEXERIL) 5 MG tablet Take 1 tablet (5 mg total) by mouth at bedtime. 30 tablet 5   Dimethicone-Petrolatum 1-30 % CREA APPLY MODERATE AMOUNT TOPICALLY EVERY DAY USE AS NEEDED ON LOWER CHEST TO CONTROL RASH     estradiol (ESTRACE) 0.1 MG/GM vaginal cream APPLY ONE GRAM INTO TOPICALLY AS DIRECTED TUESDAYS AND THURSDAYS EXTERNALLY AT BEDTIME  ONLY TO AREAS OF CONCERN     fluticasone (FLONASE) 50 MCG/ACT nasal spray INSTILL 2 SPRAYS INTO EACH NOSTRIL EVERY DAY MAXIMUM 2 SPRAYS IN EACH NOSTRIL DAILY. FOR ALLERGIES     ketoconazole (NIZORAL) 2 % shampoo      lisinopril (ZESTRIL) 40 MG tablet Take 40 mg by mouth.     meloxicam (MOBIC) 15 MG tablet Take 1 tablet (15 mg total) by mouth daily. 30 tablet 2   methocarbamol (ROBAXIN) 500 MG tablet Take 1 tablet (500 mg total) by mouth at bedtime as needed for muscle spasms. 15 tablet 0   nortriptyline (PAMELOR) 10 MG capsule Take 1 capsule (10 mg total) by mouth at bedtime. 30 capsule 5   polyvinyl alcohol (LIQUIFILM TEARS) 1.4 % ophthalmic solution INSTILL ONE DROP IN [facility] EYE TWO TIMES A DAY FOR DRY EYES     No current facility-administered medications on file prior to visit.    ALLERGIES: Allergies  Allergen Reactions   Codeine Other (See Comments)   Hctz [Hydrochlorothiazide] Other (See Comments)    Causes her potassium to drop    Trimebutine    Sertraline Anxiety    FAMILY HISTORY: Family History  Problem Relation Age of Onset   Diabetes Mother       Objective:  *** General: No acute distress.  Patient appears well-groomed.   Head:  Normocephalic/atraumatic Eyes:  Fundi examined but not visualized Neck: supple, no paraspinal tenderness, full range of motion Heart:  Regular rate and rhythm Lungs:  Clear to auscultation bilaterally Back: No paraspinal tenderness Neurological Exam: alert and oriented to person, place, and time.  Speech fluent and not dysarthric, language intact.  CN II-XII intact. Bulk and tone normal, muscle strength 5/5 throughout.  Sensation to light touch intact.  Deep tendon reflexes 2+ throughout, toes downgoing.  Finger to nose testing intact.  Gait normal, Romberg negative.   Shon Millet, DO  CC: Carolyn Rack, MD

## 2021-09-01 ENCOUNTER — Telehealth: Payer: Self-pay | Admitting: Neurology

## 2021-09-01 ENCOUNTER — Ambulatory Visit: Payer: No Typology Code available for payment source | Admitting: Neurology

## 2021-09-01 NOTE — Progress Notes (Signed)
Spoke to [facility] please resend Referral request with the ov notes [phone].  Referral and notes faxed.

## 2021-09-01 NOTE — Telephone Encounter (Signed)
Refer to notes for Referral sent 08/23/21

## 2021-09-01 NOTE — Telephone Encounter (Signed)
Patient called to see about her appt that is today. She stated she has not recvd [place] from [place].

## 2021-09-01 NOTE — Progress Notes (Signed)
Spoke the [employer] Nurse in meeting. Please resend Referral to [facility].  Patient advised.

## 2021-09-04 IMAGING — MR MR KNEE*L* W/O CM
6 series · 39 of 40 positions shown · non-contrast
Comparison: None.

CLINICAL DATA: Hyperextension injury of the left knee in February 2020.

EXAM:
MRI OF THE LEFT KNEE WITHOUT CONTRAST
TECHNIQUE: Multiplanar, multisequence MR imaging of the knee was performed. No
intravenous contrast was administered.

[Series 12: T2 fat-sat · axial · left · 4.0mm · 0.47mm/px · z∈[-97,+42]mm · 8 of 33 slices shown (1 of 3)]
[im 1/33]
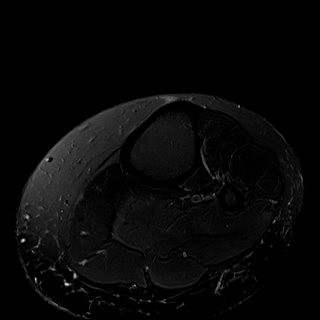
[im 5/33]
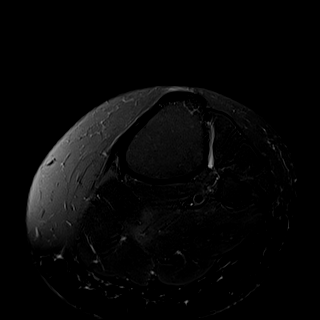
[im 10/33]
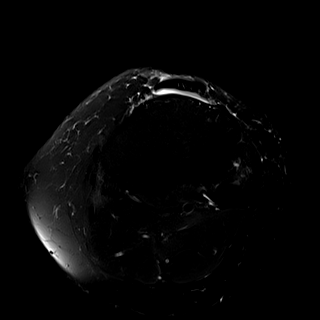
[im 14/33]
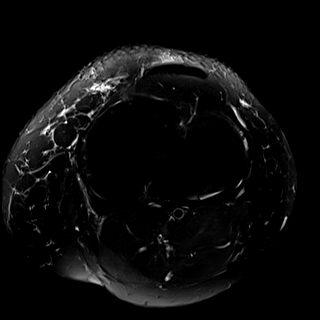
[im 19/33]
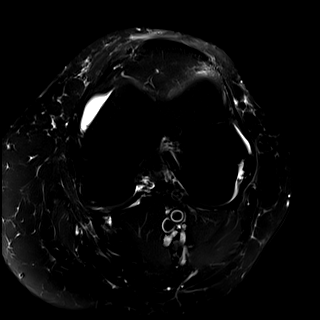
[im 23/33]
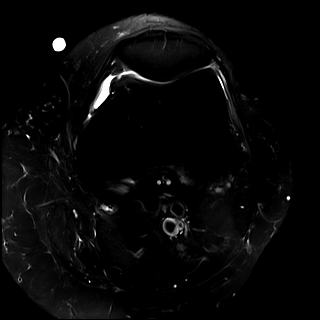
[im 28/33]
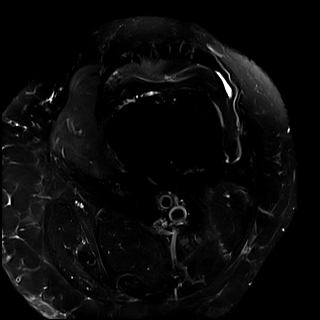
[im 33/33]
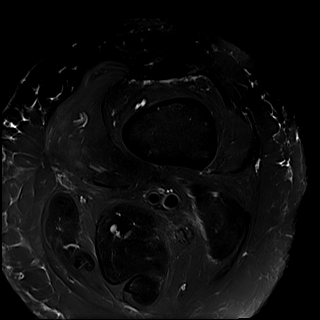

[Series 13: T1 · coronal · left · 4.0mm · 0.27mm/px · 5 of 25 slices shown]
[im 1/25]
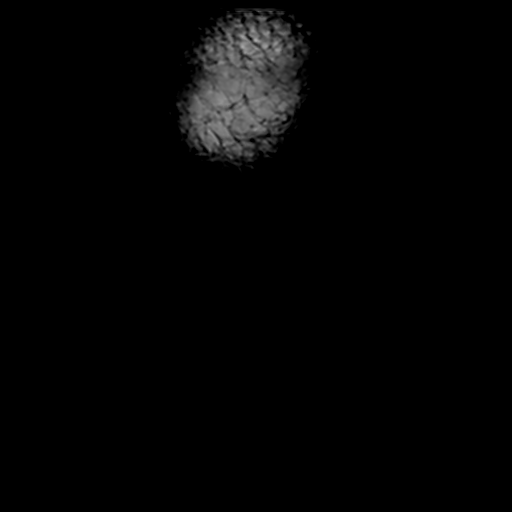
[im 5/25]
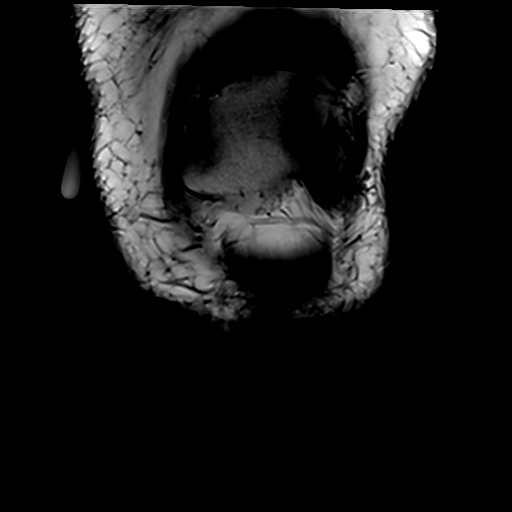
[im 10/25]
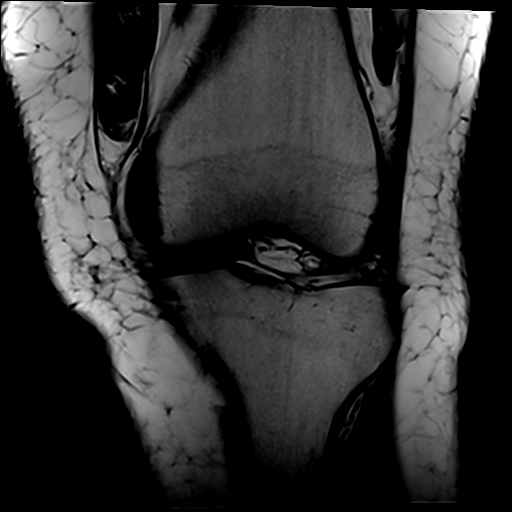
[im 15/25]
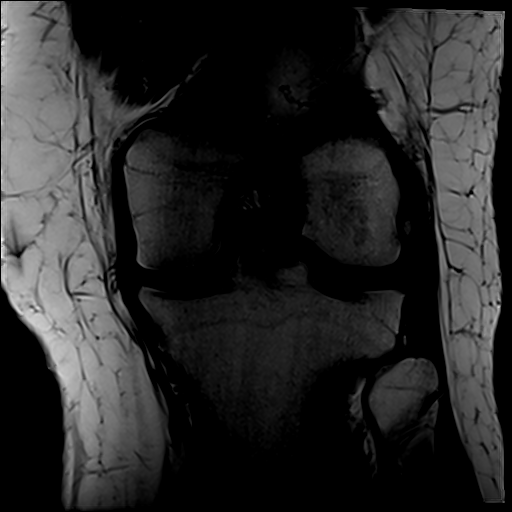
[im 20/25]
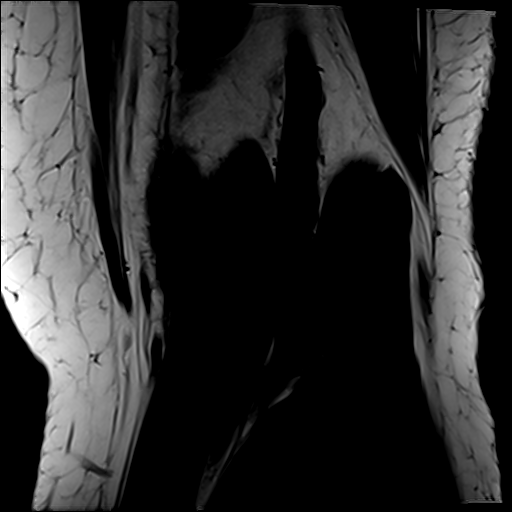

[Series 14: T2 fat-sat · coronal · left · 4.0mm · 0.55mm/px · 7 of 25 slices shown (2 of 3)]
[im 1/25]
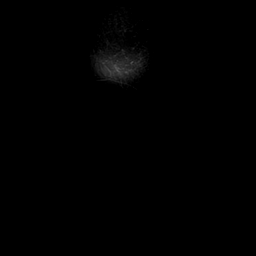
[im 5/25]
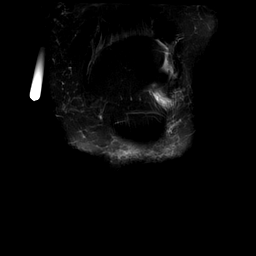
[im 9/25]
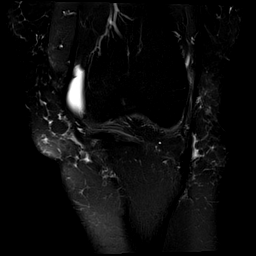
[im 13/25]
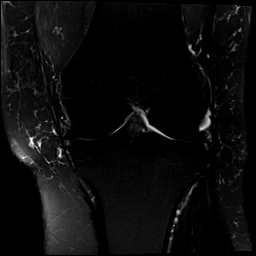
[im 17/25]
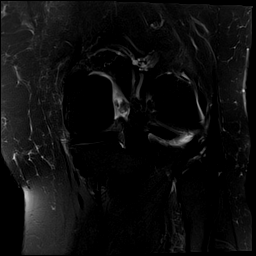
[im 21/25]
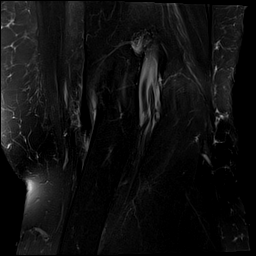
[im 25/25]
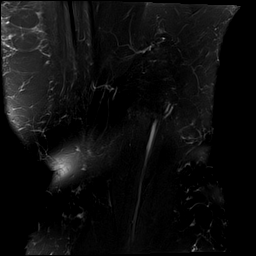

[Series 15: PD fat-sat · coronal · left · 4.0mm · 0.44mm/px · 7 of 25 slices shown (1 of 2)]
[im 1/25]
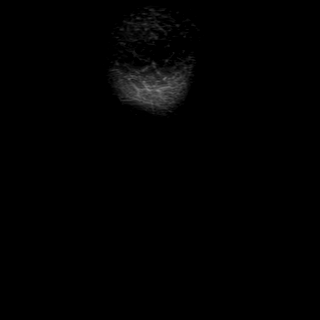
[im 5/25]
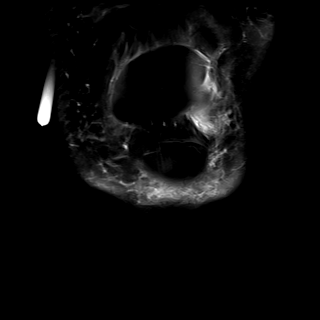
[im 9/25]
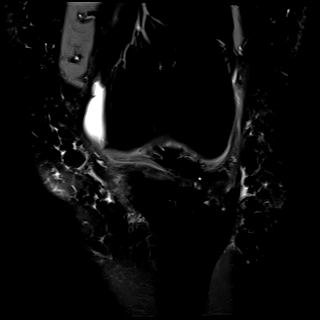
[im 13/25]
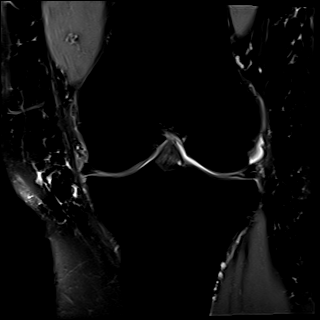
[im 17/25]
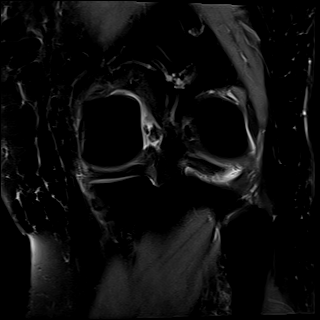
[im 21/25]
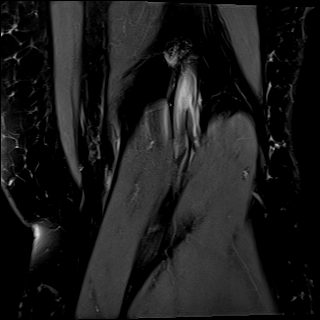
[im 25/25]
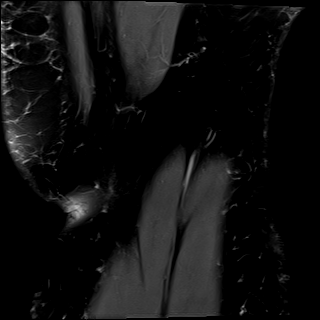

[Series 16: PD fat-sat · sagittal · left · 4.0mm · 0.44mm/px · 6 of 22 slices shown (2 of 2)]
[im 1/22]
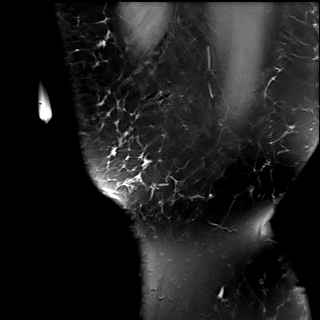
[im 5/22]
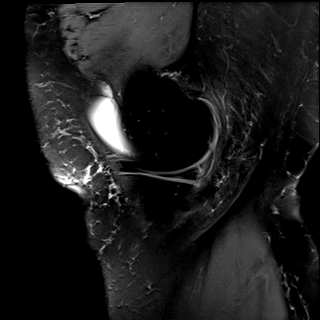
[im 9/22]
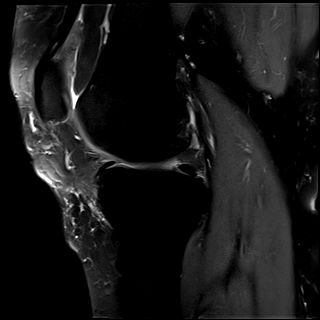
[im 13/22]
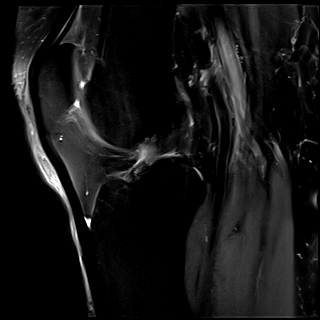
[im 17/22]
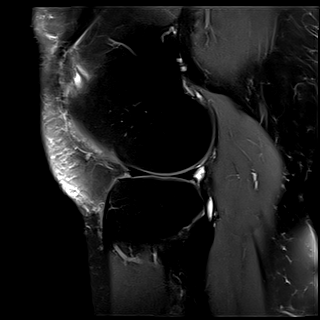
[im 22/22]
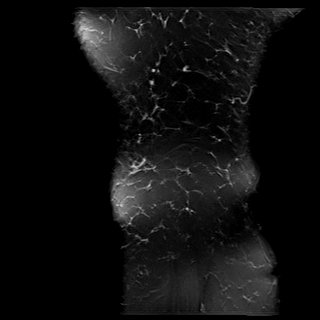

[Series 17: T2 fat-sat · sagittal · left · 4.0mm · 0.44mm/px · 6 of 22 slices shown (3 of 3)]
[im 1/22]
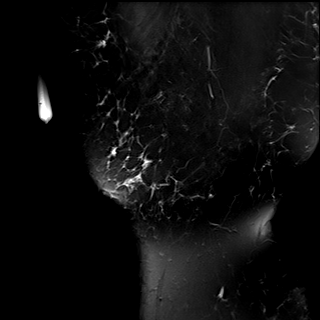
[im 5/22]
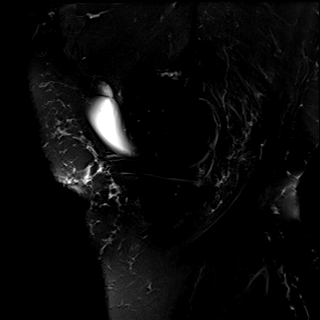
[im 9/22]
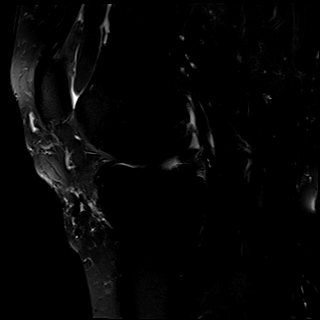
[im 13/22]
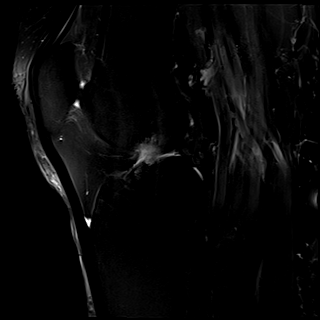
[im 17/22]
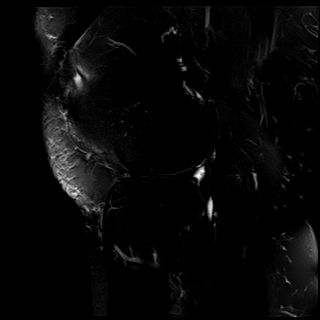
[im 22/22]
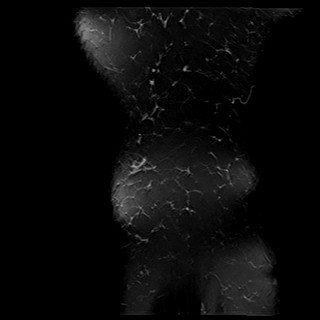

[39 of 40 positions shown; findings below may reference images not displayed]

FINDINGS: MENISCI

Medial: Undersurface tear of the posterior horn-body junction of the
medial meniscus.

Lateral: Small oblique tear of the body of the lateral meniscus
extending to the superior articular surface.

LIGAMENTS

Cruciates: ACL and PCL are intact.

Collaterals: Medial collateral ligament is intact. lateral
collateral ligament complex is intact.

CARTILAGE

Patellofemoral: Mild partial-thickness cartilage loss of the medial
trochlea with minimal subchondral reactive changes.

Medial: Mild partial-thickness cartilage loss of the medial
femorotibial compartment.

Lateral:  No chondral defect.

JOINT: No joint effusion. Normal Abimelk Tiger. No plical
thickening.

POPLITEAL FOSSA: Popliteus tendon is intact. No Baker's cyst.

EXTENSOR MECHANISM: Intact quadriceps tendon. Intact patellar
tendon. Intact lateral patellar retinaculum. Intact medial patellar
retinaculum. Intact MPFL.

BONES: No aggressive osseous lesion. No fracture or dislocation.

Other: No fluid collection or hematoma. Muscles are normal.
IMPRESSION: 1. Undersurface tear of the posterior horn-body junction of the
medial meniscus.
2. Small oblique tear of the body of the lateral meniscus extending
to the superior articular surface.
3. Mild partial-thickness cartilage loss of the medial femorotibial
compartment.

## 2021-09-11 ENCOUNTER — Encounter: Payer: No Typology Code available for payment source | Admitting: Occupational Therapy

## 2021-09-15 NOTE — Progress Notes (Signed)
PA pending. LM for Gywn to call back.

## 2022-01-22 ENCOUNTER — Telehealth: Payer: Self-pay | Admitting: Neurology

## 2022-01-22 NOTE — Telephone Encounter (Signed)
Patient would like a call back from sheena

## 2022-01-22 NOTE — Telephone Encounter (Signed)
Tried calling [facility] to check the status of [facility] referral PT and continuation of Care with Dr.Jaffe.  No answer. Refax referrals

## 2022-01-22 NOTE — Telephone Encounter (Signed)
Pt came in wanting to pick up a copy of her Orders for physical therapy for her neck. She wants to hand cary the request to the [place] since they don't seem to get it when it is faxed.

## 2022-01-23 NOTE — Telephone Encounter (Signed)
Pt called in and left a message with the access nurse. She is requesting to speak with Zenon Mayo about the paperwork

## 2022-01-23 NOTE — Telephone Encounter (Signed)
Spoke to the [facility] referral for Extra visit with Dr.Jaffe received 01/23/22. As for the PT referral rep did not see anything active at this time. Advised the rep these referrals where sent over 06/2021,08/2021,10/2021,01/2022.  Will resend the PT order.  LMOVm for patient advising of the Progress made today.

## 2022-01-24 ENCOUNTER — Telehealth: Payer: Self-pay

## 2022-01-24 NOTE — Telephone Encounter (Signed)
Spoke to rep at [facility], Please resend PT service request. No need for the ov notes. Notes scanned into the chart already.

## 2022-01-30 NOTE — Telephone Encounter (Signed)
Pt came into the office. She stated she still hasn't gotten it all worked out with the [place]. She is going to get a patient advocate involved. She would like a call back from Theodore.

## 2022-01-31 NOTE — Telephone Encounter (Signed)
Spoke to [place] at the [facility]. She did receive the Referral and it was sent to the MD. MD approved the Extra visit with Dr.Jaffe, he did not approved or haven't approved the PT referral. She put in the note this is the second request received from [place]. She will check it tomorrow and get back to me. As for the Extra visit with D.Jaffe the approval will be processed tomorrow and faxed over to [place]. Verified the faxed number to our office.   Tried calling patient to advise no answer. Vm full.

## 2022-02-02 NOTE — Telephone Encounter (Signed)
Patient stop by on 02/01/22 to pick up copies of referrals and cover sheet to speak to a patient advocate about the late response to the referrals.

## 2022-02-20 NOTE — Progress Notes (Unsigned)
NEUROLOGY FOLLOW UP OFFICE NOTE  KANA REIMANN [id number]  Assessment/Plan:   Migraine without aura, without status migrainosus, not intractable Cervicalgia, cervical radiculopathy   I have given a copy of the form for physical therapy to the patient so that she can personally give it to the [place]. Flexeril 5mg  at bedtime Start nortriptyline 10mg  at bedtime.   We can increase dose in 6 weeks if needed. Limit use of pain relievers to no more than 2 days out of week to prevent risk of rebound or medication-overuse headache. Follow up 6 months.     Subjective:  . Mendosa is a 61 year old right-handed female with back pain, HTN and depression/anxiety who follows up for headache and neck pain.   UPDATE: In January, she endorsed neck pain beginning in the upper cervical region bilaterally and radiating down the neck.  Flexion of neck aggravates it.  Sometimes her left upper arm may jerk.  She was prescribed Flexeril and physical therapy.  [facility] has not yet [facility] the [facility] because they said they have not received the form even though we have faxed it.  She reports increased muscle spasms in her neck.  Flexeril does help for a little bit.    Stopped topiramate because it upset her stomach.  Headaches occur a couple of times a week, mostly the neck and suboccipital pain.   Current NSAIDS/analgesics:  none Current triptans:  none Current ergotamine:  none Current anti-emetic:  none Current muscle relaxants:  Flexeril 5mg  QHS Current Antihypertensive medications:  lisinopril Current Antidepressant medications:  none Current Anticonvulsant medications:   Current anti-CGRP:  none Current Vitamins/Herbal/Supplements:  Magnesium oxide 400mg  TID Current Antihistamines/Decongestants:  Cetirizine  Other therapy:  none Hormone/birth control:  Estradiol   Caffeine:  No coffee.  Tea infrequently.  No soda Diet:  Keeps hydrated.  Skips meals. No soda Exercise:   walks Depression:  yes; Anxiety:  Yes, gotten worse. Other pain:  Chronic pain in back, knee Sleep hygiene:  Trouble sleeping. Related to stress.    HISTORY:  She has had migraines since age 4.  They are typically a 4-5/10 non-throbbing pressure across the forehead with nausea, photophobia and phonophobia but they have changed around 2018 associated with elevated blood pressure and her chronic pain.  She developed new severe headaches in December 2021,. a 10/10 pressure over the top of her head. It lasted for several hours.  Associated nausea, photophobia and phonophobia, black floaters in vision, dyspnea and increased sound of her tinnitus but no vomiting.  She couldn't rest.  They have been occurring 3 days a week since onset. She reports similar episode in 2019 related to stress.  Blood pressure has been elevated, reportedly 160s systolic.  MRI of brain with and without contrast and MRA of head on 07/22/2020 were normal.  Endorsed neck pain.  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   Past NSAIDS/analgesics:  Codeine (side effect), ibuprofen, ketorolac, Mobic, tramadol Past abortive triptans:  none Past abortive ergotamine:  none Past muscle relaxants:  Robaxin, Flexeril Past anti-emetic:  Zofran ODT 4mg  Past antihypertensive medications:  none Past antidepressant medications:  Sertraline, Wellbutrin Past anticonvulsant medications:  gabapentin, topiramate (upset her stomach) Past anti-CGRP:  none Past vitamins/Herbal/Supplements:  none Past antihistamines/decongestants:  none Other past therapies:  none     Family history of headache:  She is not aware  PAST MEDICAL HISTORY: Past Medical History:  Diagnosis Date  Anxiety    Back pain    Depression    Hypertension     MEDICATIONS: Current Outpatient Medications on File Prior to Visit  Medication Sig Dispense Refill   aluminum chloride (DRYSOL) 20 %  external solution APPLY SMALL AMOUNT TOPICALLY AS DIRECTED APPLY TO CLEAN UNDERARMS AT NIGHT AND WASH OFF IN THE MORNING. USE 2  NIGHTS A WEEK AND IF TOLERATING CAN INCREASE TO EVERY OTHER NIGHT. APPLY TO CLEAN UNDERARMS AT NIGHT AND WASH OFF IN THE MORNING. USE 2   NIGHTS A WEEK AND IF TOLERATING CAN INCREASE TO EVERY OTHER NIGHT.     Benzoyl Peroxide 5 % lotion APPLY SMALL AMOUNT TOPICALLY AS DIRECTED APPLY TO GROIN AREA EVERY OTHER DAY TO DAILY     cholecalciferol (VITAMIN D) 400 units TABS tablet Take 1,000 Units by mouth.     cyclobenzaprine (FLEXERIL) 5 MG tablet Take 1 tablet (5 mg total) by mouth at bedtime. 30 tablet 5   Dimethicone-Petrolatum 1-30 % CREA APPLY MODERATE AMOUNT TOPICALLY EVERY DAY USE AS NEEDED ON LOWER CHEST TO CONTROL RASH     estradiol (ESTRACE) 0.1 MG/GM vaginal cream APPLY ONE GRAM INTO TOPICALLY AS DIRECTED TUESDAYS AND THURSDAYS EXTERNALLY AT BEDTIME  ONLY TO AREAS OF CONCERN     fluticasone (FLONASE) 50 MCG/ACT nasal spray INSTILL 2 SPRAYS INTO EACH NOSTRIL EVERY DAY MAXIMUM 2 SPRAYS IN EACH NOSTRIL DAILY. FOR ALLERGIES     ketoconazole (NIZORAL) 2 % shampoo      lisinopril (ZESTRIL) 40 MG tablet Take 40 mg by mouth.     meloxicam (MOBIC) 15 MG tablet Take 1 tablet (15 mg total) by mouth daily. 30 tablet 2   methocarbamol (ROBAXIN) 500 MG tablet Take 1 tablet (500 mg total) by mouth at bedtime as needed for muscle spasms. 15 tablet 0   nortriptyline (PAMELOR) 10 MG capsule Take 1 capsule (10 mg total) by mouth at bedtime. 30 capsule 5   polyvinyl alcohol (LIQUIFILM TEARS) 1.4 % ophthalmic solution INSTILL ONE DROP IN [facility] E[facility] TWO TIMES A DAY FOR DRY EYES     No current facility-administered medications on file prior to visit.    ALLERGIES: Allergies  Allergen Reactions   Codeine Other (See Comments)   Hctz [Hydrochlorothiazide] Other (See Comments)    Causes her potassium to drop   Trimebutine    Sertraline Anxiety    FAMILY HISTORY: Family History   Problem Relation Age of Onset   Diabetes Mother       Objective:  *** General: No acute distress.  Patient appears ***-groomed.   Head:  Normocephalic/atraumatic Eyes:  Fundi examined but not visualized Neck: supple, no paraspinal tenderness, full range of motion Heart:  Regular rate and rhythm Lungs:  Clear to auscultation bilaterally Back: No paraspinal tenderness Neurological Exam: alert and oriented to person, place, and time.  Speech fluent and not dysarthric, language intact.  CN II-XII intact. Bulk and tone normal, muscle strength 5/5 throughout.  Sensation to light touch intact.  Deep tendon reflexes 2+ throughout, toes downgoing.  Finger to nose testing intact.  Gait normal, Romberg negative.   Shon Millet, DO  CC: ***

## 2022-02-21 ENCOUNTER — Encounter: Payer: Self-pay | Admitting: Neurology

## 2022-02-21 ENCOUNTER — Ambulatory Visit (INDEPENDENT_AMBULATORY_CARE_PROVIDER_SITE_OTHER): Payer: No Typology Code available for payment source | Admitting: Neurology

## 2022-02-21 VITALS — BP 125/79 | HR 81 | Ht 67.0 in | Wt 236.0 lb

## 2022-02-21 DIAGNOSIS — G4486 Cervicogenic headache: Secondary | ICD-10-CM | POA: Diagnosis not present

## 2022-02-21 DIAGNOSIS — M542 Cervicalgia: Secondary | ICD-10-CM

## 2022-02-21 MED ORDER — NORTRIPTYLINE HCL 10 MG PO CAPS
10.0000 mg | ORAL_CAPSULE | Freq: Every day | ORAL | 5 refills | Status: DC
Start: 2022-02-21 — End: 2022-06-27

## 2022-02-21 MED ORDER — CYCLOBENZAPRINE HCL 5 MG PO TABS
5.0000 mg | ORAL_TABLET | Freq: Every day | ORAL | 5 refills | Status: DC
Start: 2022-02-21 — End: 2022-02-22

## 2022-02-22 ENCOUNTER — Other Ambulatory Visit: Payer: Self-pay | Admitting: Neurology

## 2022-02-22 MED ORDER — CYCLOBENZAPRINE HCL 5 MG PO TABS
5.0000 mg | ORAL_TABLET | Freq: Every day | ORAL | 5 refills | Status: DC
Start: 2022-02-22 — End: 2022-03-15

## 2022-03-15 ENCOUNTER — Emergency Department: Payer: No Typology Code available for payment source

## 2022-03-15 ENCOUNTER — Other Ambulatory Visit: Payer: Self-pay

## 2022-03-15 ENCOUNTER — Emergency Department
Admission: EM | Admit: 2022-03-15 | Discharge: 2022-03-15 | Disposition: A | Discharge status: Home / Self Care | Payer: No Typology Code available for payment source | Attending: Emergency Medicine | Admitting: Emergency Medicine

## 2022-03-15 DIAGNOSIS — S8992XA Unspecified injury of left lower leg, initial encounter: Secondary | ICD-10-CM | POA: Diagnosis present

## 2022-03-15 DIAGNOSIS — X501XXA Overexertion from prolonged static or awkward postures, initial encounter: Secondary | ICD-10-CM | POA: Diagnosis not present

## 2022-03-15 DIAGNOSIS — S46211A Strain of muscle, fascia and tendon of other parts of biceps, right arm, initial encounter: Secondary | ICD-10-CM

## 2022-03-15 DIAGNOSIS — M5432 Sciatica, left side: Secondary | ICD-10-CM | POA: Insufficient documentation

## 2022-03-15 DIAGNOSIS — S86912A Strain of unspecified muscle(s) and tendon(s) at lower leg level, left leg, initial encounter: Secondary | ICD-10-CM | POA: Insufficient documentation

## 2022-03-15 DIAGNOSIS — M25572 Pain in left ankle and joints of left foot: Secondary | ICD-10-CM

## 2022-03-15 DIAGNOSIS — I10 Essential (primary) hypertension: Secondary | ICD-10-CM | POA: Diagnosis not present

## 2022-03-15 MED ORDER — PREDNISONE 10 MG (21) PO TBPK: ORAL_TABLET | ORAL | 0 refills | Status: DC

## 2022-03-15 MED ORDER — KETOROLAC TROMETHAMINE 60 MG/2ML IM SOLN
30.0000 mg | Freq: Once | INTRAMUSCULAR | Status: AC
  Administered 2022-03-15: 30 mg via INTRAMUSCULAR
  Filled 2022-03-15: qty 2

## 2022-03-15 MED ORDER — METHOCARBAMOL 500 MG PO TABS: 500.0000 mg | ORAL_TABLET | Freq: Four times a day (QID) | ORAL | 0 refills | Status: DC

## 2022-03-15 MED ORDER — HYDROCODONE-ACETAMINOPHEN 5-325 MG PO TABS: 1.0000 | ORAL_TABLET | Freq: Four times a day (QID) | ORAL | 0 refills | Status: AC | PRN

## 2022-03-15 NOTE — Discharge Instructions (Signed)
Please call and schedule an appointment with orthopedics.  Take your medications as prescribed.  We discussed potential intervention by physiatry.  This need and or referral will be decided by the orthopedic provider.

## 2022-03-15 NOTE — ED Provider Triage Note (Signed)
Emergency Medicine Provider Triage Evaluation Note  Carolyn Gregory, a 61 y.o. female  was evaluated in triage.  Pt complains of left knee pain as well as some left lower extremity referral pain..  Review of Systems  Positive: Left knee pain, LLE radicular pain Negative: CP, SOB  Physical Exam  BP 136/74   Pulse (!) 58   Temp 98.5 F (36.9 C) (Oral)   Resp 16   Wt 108.9 kg   SpO2 96%   BMI 37.59 kg/m  Gen:   Awake, no distress  NAD Resp:  Normal effort CTA MSK:   Moves extremities without difficulty  CVS:  RRR  Medical Decision Making  Medically screening exam initiated at 4:23 PM.  Appropriate orders placed.  Carolyn Gregory was informed that the remainder of the evaluation will be completed by another provider, this initial triage assessment does not replace that evaluation, and the importance of remaining in the ED until their evaluation is complete.  Patient to the ED for evaluation of left knee pain as well as some left lower extremity referral from the hip pocket.  She denies any recent injury, trauma, fall.   Lissa Hoard, PA-C 03/15/22 [phone]

## 2022-03-15 NOTE — ED Provider Notes (Signed)
[facility] Provider Note    Event Date/Time   First MD Initiated Contact with Patient 03/15/22 1638     (approximate)   History   Knee Pain   HPI  Carolyn Gregory is a 61 y.o. female with history of anxiety, hypertension, right ankle surgery, chondromalacia of the left knee and as listed in EMR presents to the emergency department for treatment and evaluation of radicular pain in the posterior aspect of the left leg, left knee pain, left ankle pain, and right shoulder pain.  Patient states that her left foot fell asleep while she was sitting on the toilet and when she stood her knee twisted awkwardly causing her to fall to the right striking her shoulder on the wall.  Since that time, she has had pain in the left knee, left ankle, and right shoulder.  She is also experiencing continued tingling the posterior thigh to the foot.  Radiculopathy increases with extended periods of sitting and she has trouble getting comfortable at night.  No alleviating measures attempted prior to arrival.      Physical Exam   Triage Vital Signs: ED Triage Vitals [03/15/22 1615]  Enc Vitals Group     BP 136/74     Pulse Rate (!) 58     Resp 16     Temp 98.5 F (36.9 C)     Temp Source Oral     SpO2 96 %     Weight 240 lb (108.9 kg)     Height      Head Circumference      Peak Flow      Pain Score 10     Pain Loc      Pain Edu?      Excl. in GC?     Most recent vital signs: Vitals:   03/15/22 1615  BP: 136/74  Pulse: (!) 58  Resp: 16  Temp: 98.5 F (36.9 C)  SpO2: 96%    General: Awake, no distress.  CV:  Good peripheral perfusion.  Resp:  Normal effort.  Abd:  No distention.  Other:  Equal strength of upper and lower extremities bilaterally.  Pain in the medial aspect of the left knee increases with attempt to flex or fully extend.  ED Results / Procedures / Treatments   Labs (all labs ordered are listed, but only abnormal results are  displayed) Labs Reviewed - No data to display   EKG  Not indicated   RADIOLOGY  Image interpreted and radiology report reviewed by me.  Images of the lumbar spine show degenerative disc and facet disease without acute findings.  Images of the left knee shows mild degenerative changes but no acute concerns.  PROCEDURES:  Critical Care performed: No  Procedures   MEDICATIONS ORDERED IN ED: Medications  ketorolac (TORADOL) injection 30 mg (30 mg Intramuscular Given 03/15/22 1809)     IMPRESSION / MDM / ASSESSMENT AND PLAN / ED COURSE   I have reviewed the triage note.  Differential diagnosis includes, but is not limited to, sciatica, degenerative disc disease, disc herniation  61 year old female presenting to the emergency department for treatment and evaluation of left knee pain as well as radiculopathy that runs from posterior thigh to her foot.  See HPI for further details.  Imaging of the lumbar spine and left knee are both negative for acute bony abnormality or acute concerns.  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  On exam, she also had some tenderness in the proximal biceps area with focal tenderness on abduction and rotation of the shoulder and elbow. No bony tenderness. No neuro deficits of the left lower extremity.  Results, details of exam findings, and recommendations discussed with the patient at length.  Plan will be to treat her with prednisone, methocarbamol, and Norco.  She will be given follow-up information for orthopedics.  If symptoms change, worsen, or for new concerns she is to return to the emergency department.      FINAL CLINICAL IMPRESSION(S) / ED DIAGNOSES   Final diagnoses:  Sciatica, left side  Knee strain, left, initial encounter  Strain of right biceps tendon  Pain of joint of left ankle and foot     Rx / DC Orders   ED Discharge Orders          Ordered    predniSONE  (STERAPRED UNI-PAK 21 TAB) 10 MG (21) TBPK tablet        03/15/22 1813    methocarbamol (ROBAXIN) 500 MG tablet  4 times daily        03/15/22 1813    HYDROcodone-acetaminophen (NORCO/VICODIN) 5-325 MG tablet  Every 6 hours PRN        03/15/22 1813             Note:  This document was prepared using Dragon voice recognition software and may include unintentional dictation errors.   Chinita Pester, FNP 03/16/22 1729    Concha Se, MD 03/19/22 [phone]

## 2022-03-15 NOTE — ED Triage Notes (Signed)
Pt arrives with c/o left knee pain and numbness that radiates down her leg.

## 2022-03-24 ENCOUNTER — Emergency Department
Admission: EM | Admit: 2022-03-24 | Discharge: 2022-03-24 | Disposition: A | Discharge status: Home / Self Care | Payer: No Typology Code available for payment source | Attending: Emergency Medicine | Admitting: Emergency Medicine

## 2022-03-24 ENCOUNTER — Emergency Department: Payer: No Typology Code available for payment source

## 2022-03-24 ENCOUNTER — Other Ambulatory Visit: Payer: Self-pay

## 2022-03-24 DIAGNOSIS — S60221A Contusion of right hand, initial encounter: Secondary | ICD-10-CM

## 2022-03-24 DIAGNOSIS — I10 Essential (primary) hypertension: Secondary | ICD-10-CM | POA: Insufficient documentation

## 2022-03-24 DIAGNOSIS — W208XXA Other cause of strike by thrown, projected or falling object, initial encounter: Secondary | ICD-10-CM | POA: Diagnosis not present

## 2022-03-24 DIAGNOSIS — S8012XA Contusion of left lower leg, initial encounter: Secondary | ICD-10-CM | POA: Insufficient documentation

## 2022-03-24 DIAGNOSIS — S8992XA Unspecified injury of left lower leg, initial encounter: Secondary | ICD-10-CM | POA: Diagnosis present

## 2022-03-24 MED ORDER — HYDROCODONE-ACETAMINOPHEN 5-325 MG PO TABS: 1.0000 | ORAL_TABLET | Freq: Four times a day (QID) | ORAL | 0 refills | Status: DC | PRN

## 2022-03-24 NOTE — ED Triage Notes (Signed)
Pt comes with c/o left knee pain after some furniture fell onto her. Pt states this happened this am. Pt states pain and some swelling to area.

## 2022-03-24 NOTE — ED Provider Notes (Signed)
[facility] Provider Note    Event Date/Time   First MD Initiated Contact with Patient 03/24/22 [phone]     (approximate)   History   Knee Injury   HPI  Carolyn Gregory is a 61 y.o. female presents to the ED with complaint of left knee pain after some furniture that she was trying to move fell onto her.  She states that she also injured her right hand with this and also has a bruise in that area.  Patient denies any head injury or loss of consciousness during this event.  Patient has a history of anxiety, back pain, depression and hypertension.     Physical Exam   Triage Vital Signs: ED Triage Vitals  Enc Vitals Group     BP 03/24/22 0814 (!) 174/104     Pulse Rate 03/24/22 0814 62     Resp 03/24/22 0814 19     Temp 03/24/22 0814 98.5 F (36.9 C)     Temp src --      SpO2 03/24/22 0814 100 %     Weight --      Height --      Head Circumference --      Peak Flow --      Pain Score 03/24/22 0813 9     Pain Loc --      Pain Edu? --      Excl. in GC? --     Most recent vital signs: Vitals:   03/24/22 0814 03/24/22 0950  BP: (!) 174/104 (!) 158/87  Pulse: 62 (!) 54  Resp: 19 16  Temp: 98.5 F (36.9 C)   SpO2: 100% 99%     General: Awake, no distress.  CV:  Good peripheral perfusion.  Resp:  Normal effort.  Abd:  No distention.  Other:  On examination of the right hand there is no gross deformity or soft tissue swelling but patient does have an ecchymotic area on the volar aspect palm area.  Patient is able to move all digits without any difficulty motor or sensory function intact.  Skin is intact.  On examination of the left lower extremity there is no gross deformity, effusion of the knee or skin abrasions.  Patient's range of motion in the lower extremity is guarded secondary to increased pain.   ED Results / Procedures / Treatments   Labs (all labs ordered are listed, but only abnormal results are displayed) Labs Reviewed - No data  to display    RADIOLOGY  Right hand x-ray images were reviewed by myself independent of the radiologist and was negative for acute bony injury or dislocations.  Radiology report is negative for bony abnormality.   Left tib-fib x-rays were reviewed and were negative for bony abnormality.  Radiology report was reviewed and was negative also.   PROCEDURES:  Critical Care performed:   Procedures   MEDICATIONS ORDERED IN ED: Medications - No data to display   IMPRESSION / MDM / ASSESSMENT AND PLAN / ED COURSE  I reviewed the triage vital signs and the nursing notes.   Differential diagnosis includes, but is not limited to, fracture/dislocation right hand, contusion, contusion left lower extremity, fracture, dislocation.  61 year old female presents to the ED with complaint of left lower extremity pain including the knee and mid tib-fib area along with right hand pain.  Patient states that she was moving furniture this morning and had some furniture fall against her.  Patient has continued to be ambulatory  since this accident.  X-rays were obtained and were negative which was reassuring for the patient.  A prescription for hydrocodone was sent to the pharmacy to take as needed for pain and she is encouraged to use ice and elevation to help with pain and control any swelling.  She is to follow-up with her PCP or urgent care if any continued problems.      Patient's presentation is most consistent with acute complicated illness / injury requiring diagnostic workup.  FINAL CLINICAL IMPRESSION(S) / ED DIAGNOSES   Final diagnoses:  Contusion of right hand, initial encounter  Contusion of left lower extremity, initial encounter     Rx / DC Orders   ED Discharge Orders          Ordered    HYDROcodone-acetaminophen (NORCO/VICODIN) 5-325 MG tablet  Every 6 hours PRN        03/24/22 0941             Note:  This document was prepared using Dragon voice recognition software and  may include unintentional dictation errors.   Johnn Hai, PA-C 03/24/22 1352    Nena Polio, MD 03/24/22 [phone]

## 2022-03-24 NOTE — Discharge Instructions (Addendum)
With your primary care provider if any continued problems or concerns.  Apply ice and elevate your leg as needed for discomfort.  Hydrocodone was sent to the pharmacy to take as needed for pain.  Do not drive or operate machinery while taking this medication as it could cause drowsiness and increase your risk for injury.

## 2022-03-26 ENCOUNTER — Ambulatory Visit: Payer: No Typology Code available for payment source | Admitting: Neurology

## 2022-06-25 NOTE — Progress Notes (Unsigned)
Virtual Visit via Video Note  Consent was obtained for video visit:  Yes.   Answered questions that patient had about telehealth interaction:  Yes.   I discussed the limitations, risks, security and privacy concerns of performing an evaluation and management service by telemedicine. I also discussed with the patient that there may be a patient responsible charge related to this service. The patient expressed understanding and agreed to proceed.  Pt location: Home Physician Location: office Name of referring provider:  Yvette Rack, MD I connected with Carolyn Gregory at patients initiation/request on 06/27/2022 at 10:30 AM EST by video enabled telemedicine application and verified that I am speaking with the correct person using two identifiers. Pt MRN:  [id number] Pt DOB:  Feb 28, 1961 Video Participants:  Carolyn Gregory;   Assessment/Plan:   Migraine without aura, without status migrainosus, not intractable Cervicalgia   Follow up on referral to physical therapy for cervicalgia Increase Flexeril to 10mg  at bedtime (hopefully will be more effective) Limit use of pain relievers to no more than 2 days out of week to prevent risk of rebound or medication-overuse headache. Follow up 4 to 5 months.     Subjective:  . Hodgkins is a 61 year old right-handed female with back pain, HTN and depression/anxiety who follows up for headache and neck pain.   UPDATE: We ordered physical therapy for her neck pain.  We sent a referral but she was told they never received a referral.  She never refilled the cyclobenzaprine because she didn't know that she had refills.  Headaches are only problematic when the neck pain flares up.    Current NSAIDS/analgesics:  none Current triptans:  none Current ergotamine:  none Current anti-emetic:  none Current muscle relaxants:  none Current Antihypertensive medications:  lisinopril Current Antidepressant medications:  none Current  Anticonvulsant medications:   Current anti-CGRP:  none Current Vitamins/Herbal/Supplements:  Magnesium oxide 400mg  TID Current Antihistamines/Decongestants:  Cetirizine  Other therapy:  none Hormone/birth control:  Estradiol   Caffeine:  No coffee.  Tea infrequently.  No soda Diet:  Keeps hydrated.  Skips meals. No soda Exercise:  walks Depression:  yes; Anxiety:  Yes, gotten worse. Other pain:  Chronic pain in back, knee Sleep hygiene:  Trouble sleeping. Related to stress.    HISTORY:  She has had migraines since age 83.  They are typically a 4-5/10 non-throbbing pressure across the forehead with nausea, photophobia and phonophobia but they have changed around 2018 associated with elevated blood pressure and her chronic pain.  She developed new severe headaches in December 2021, a 10/10 pressure over the top of her head. It lasted for several hours.  Associated nausea, photophobia and phonophobia, black floaters in vision, dyspnea and increased sound of her tinnitus but no vomiting.  She couldn't rest.  They have been occurring 3 days a week since onset. She reports similar episode in 2019 related to stress.  Blood pressure has been elevated, reportedly 160s systolic.  MRI of brain with and without contrast and MRA of head on 07/22/2020 were normal.  Endorsed neck pain.  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   Past NSAIDS/analgesics:  Codeine (side effect), ibuprofen, ketorolac, Mobic, tramadol Past abortive triptans:  none Past abortive ergotamine:  none Past muscle relaxants:  Robaxin, Flexeril Past anti-emetic:  Zofran ODT 4mg  Past antihypertensive medications:  none Past antidepressant medications:  Sertraline, Wellbutrin Past anticonvulsant medications:  gabapentin (caused eye twitching),  topiramate (upset her stomach) Past anti-CGRP:  none Past vitamins/Herbal/Supplements:  none Past  antihistamines/decongestants:  none Other past therapies:  none     Family history of headache:  She is not aware  Past Medical History: Past Medical History:  Diagnosis Date   Anxiety    Back pain    Depression    Hypertension     Medications: Outpatient Encounter Medications as of 06/27/2022  Medication Sig Note   aluminum chloride (DRYSOL) 20 % external solution APPLY SMALL AMOUNT TOPICALLY AS DIRECTED APPLY TO CLEAN UNDERARMS AT NIGHT AND WASH OFF IN THE MORNING. USE 2  NIGHTS A WEEK AND IF TOLERATING CAN INCREASE TO EVERY OTHER NIGHT. APPLY TO CLEAN UNDERARMS AT NIGHT AND WASH OFF IN THE MORNING. USE 2   NIGHTS A WEEK AND IF TOLERATING CAN INCREASE TO EVERY OTHER NIGHT.    Benzoyl Peroxide 5 % lotion APPLY SMALL AMOUNT TOPICALLY AS DIRECTED APPLY TO GROIN AREA EVERY OTHER DAY TO DAILY    cholecalciferol (VITAMIN D) 400 units TABS tablet Take 1,000 Units by mouth. 02/25/2017: 1000 units    cyclobenzaprine (FLEXERIL) 10 MG tablet Take 1 tablet (10 mg total) by mouth at bedtime.    Dimethicone-Petrolatum 1-30 % CREA APPLY MODERATE AMOUNT TOPICALLY EVERY DAY USE AS NEEDED ON LOWER CHEST TO CONTROL RASH    estradiol (ESTRACE) 0.1 MG/GM vaginal cream APPLY ONE GRAM INTO TOPICALLY AS DIRECTED TUESDAYS AND THURSDAYS EXTERNALLY AT BEDTIME  ONLY TO AREAS OF CONCERN    ketoconazole (NIZORAL) 2 % shampoo     lisinopril (ZESTRIL) 40 MG tablet Take 40 mg by mouth.    polyvinyl alcohol (LIQUIFILM TEARS) 1.4 % ophthalmic solution INSTILL ONE DROP IN EACH EYE TWO TIMES A DAY FOR DRY EYES    [DISCONTINUED] fluticasone (FLONASE) 50 MCG/ACT nasal spray     [DISCONTINUED] HYDROcodone-acetaminophen (NORCO/VICODIN) 5-325 MG tablet Take 1 tablet by mouth every 6 (six) hours as needed.    [DISCONTINUED] methocarbamol (ROBAXIN) 500 MG tablet Take 1 tablet (500 mg total) by mouth 4 (four) times daily.    [DISCONTINUED] nortriptyline (PAMELOR) 10 MG capsule Take 1 capsule (10 mg total) by mouth at bedtime.     [DISCONTINUED] cyclobenzaprine (FLEXERIL) 5 MG tablet Take 1 tablet (5 mg total) by mouth at bedtime.    [DISCONTINUED] meloxicam (MOBIC) 15 MG tablet Take 1 tablet (15 mg total) by mouth daily. (Patient not taking: Reported on 02/21/2022)    [DISCONTINUED] methocarbamol (ROBAXIN) 500 MG tablet Take 1 tablet (500 mg total) by mouth at bedtime as needed for muscle spasms. (Patient not taking: Reported on 02/21/2022)    No facility-administered encounter medications on file as of 06/27/2022.    Allergies: Allergies  Allergen Reactions   Codeine Other (See Comments)   Hctz [Hydrochlorothiazide] Other (See Comments)    Causes her potassium to drop   Trimebutine    Sertraline Anxiety    Family History: Family History  Problem Relation Age of Onset   Diabetes Mother     Observations/Objective:   No acute distress.  Alert and oriented.  Speech fluent and not dysarthric.  Language intact.     Follow Up Instructions:    -I discussed the assessment and treatment plan with the patient. The patient was provided an opportunity to ask questions and all were answered. The patient agreed with the plan and demonstrated an understanding of the instructions.   The patient was advised to call back or seek an in-person evaluation if the symptoms worsen or  if the condition fails to improve as anticipated.  Shon Millet, DO  CC: Curlene Dolphin, MD

## 2022-06-27 ENCOUNTER — Telehealth (INDEPENDENT_AMBULATORY_CARE_PROVIDER_SITE_OTHER): Payer: No Typology Code available for payment source | Admitting: Neurology

## 2022-06-27 ENCOUNTER — Encounter: Payer: Self-pay | Admitting: Neurology

## 2022-06-27 DIAGNOSIS — G4486 Cervicogenic headache: Secondary | ICD-10-CM | POA: Diagnosis not present

## 2022-06-27 DIAGNOSIS — G43009 Migraine without aura, not intractable, without status migrainosus: Secondary | ICD-10-CM | POA: Diagnosis not present

## 2022-06-27 DIAGNOSIS — M542 Cervicalgia: Secondary | ICD-10-CM

## 2022-06-27 MED ORDER — CYCLOBENZAPRINE HCL 10 MG PO TABS: 10.0000 mg | ORAL_TABLET | Freq: Every day | ORAL | 5 refills | Status: DC

## 2022-07-14 ENCOUNTER — Emergency Department: Payer: No Typology Code available for payment source

## 2022-07-14 ENCOUNTER — Other Ambulatory Visit: Payer: Self-pay

## 2022-07-14 ENCOUNTER — Emergency Department
Admission: EM | Admit: 2022-07-14 | Discharge: 2022-07-14 | Disposition: A | Discharge status: Home / Self Care | Payer: No Typology Code available for payment source | Attending: Emergency Medicine | Admitting: Emergency Medicine

## 2022-07-14 DIAGNOSIS — R0789 Other chest pain: Secondary | ICD-10-CM | POA: Insufficient documentation

## 2022-07-14 DIAGNOSIS — I10 Essential (primary) hypertension: Secondary | ICD-10-CM | POA: Insufficient documentation

## 2022-07-14 LAB — BASIC METABOLIC PANEL
Anion gap: 7 (ref 5–15)
BUN: 19 mg/dL (ref 6–20)
CO2: 22 mmol/L (ref 22–32)
Calcium: 9 mg/dL (ref 8.9–10.3)
Chloride: 112 mmol/L — ABNORMAL HIGH (ref 98–111)
Creatinine, Ser: 1 mg/dL (ref 0.44–1.00)
GFR, Estimated: >60 mL/min (ref >60)
Glucose, Bld: 111 mg/dL — ABNORMAL HIGH (ref 70–99)
Potassium: 3.7 mmol/L (ref 3.5–5.1)
Sodium: 141 mmol/L (ref 135–145)

## 2022-07-14 LAB — TROPONIN I (HIGH SENSITIVITY)
Troponin I (High Sensitivity): 4 ng/L (ref <18)
Troponin I (High Sensitivity): 3 ng/L (ref <18)

## 2022-07-14 LAB — CBC
HCT: 40.9 % (ref 36.0–46.0)
Hemoglobin: 13.3 g/dL (ref 12.0–15.0)
MCH: 28.2 pg (ref 26.0–34.0)
MCHC: 32.5 g/dL (ref 30.0–36.0)
MCV: 86.8 fL (ref 80.0–100.0)
Platelets: 246 10*3/uL (ref 150–400)
RBC: 4.71 MIL/uL (ref 3.87–5.11)
RDW: 14.6 % (ref 11.5–15.5)
WBC: 6.9 10*3/uL (ref 4.0–10.5)
nRBC: 0 % (ref 0.0–0.2)

## 2022-07-14 MED ORDER — NAPROXEN 500 MG PO TABS: 500.0000 mg | ORAL_TABLET | Freq: Two times a day (BID) | ORAL | 2 refills | Status: DC

## 2022-07-14 MED ORDER — KETOROLAC TROMETHAMINE 30 MG/ML IJ SOLN
30.0000 mg | Freq: Once | INTRAMUSCULAR | Status: AC
  Administered 2022-07-14: 30 mg via INTRAVENOUS
  Filled 2022-07-14: qty 1

## 2022-07-14 MED ORDER — IOHEXOL 350 MG/ML SOLN
100.0000 mL | Freq: Once | INTRAVENOUS | Status: AC | PRN
  Administered 2022-07-14: 75 mL via INTRAVENOUS

## 2022-07-14 MED ORDER — METHOCARBAMOL 500 MG PO TABS: 500.0000 mg | ORAL_TABLET | Freq: Three times a day (TID) | ORAL | 1 refills | Status: DC | PRN

## 2022-07-14 NOTE — ED Triage Notes (Signed)
Pt arrives to ED via POV c/o Left sided chest discomfort that started yesterday. It began as a mild uncomfortable pressure that has worsened this morning. Left chest is now tender to touch and hurts with deep breathing and bending down. Pt states it radiates downward. Hx of HTN, took lisinopril PTA. Pt in NAD, denies SOB.

## 2022-07-14 NOTE — ED Provider Notes (Signed)
[facility] Provider Note    Event Date/Time   First MD Initiated Contact with Patient 07/14/22 [phone]     (approximate)   History   Chest Pain   HPI  Carolyn Gregory is a 62 y.o. female with a history of high blood pressure who presents with complaints of left-sided chest pain.  Patient reports this developed about a day and a half ago.  She reports the pain is in the left side of her chest primarily.  It is worse with deep inspiration and movement and pressure.  She does report recent travel to [place] where she drove.  There was some calf pain afterwards.  No fevers or chills or cough.  No history of blood clots.  No history of CAD.     Physical Exam   Triage Vital Signs: ED Triage Vitals  Enc Vitals Group     BP 07/14/22 0433 (!) 177/96     Pulse Rate 07/14/22 0433 68     Resp 07/14/22 0433 16     Temp 07/14/22 0444 99 F (37.2 C)     Temp Source 07/14/22 0433 Oral     SpO2 07/14/22 0433 97 %     Weight 07/14/22 0440 111.1 kg (245 lb)     Height 07/14/22 0440 1.676 m (5\' 6" )     Head Circumference --      Peak Flow --      Pain Score 07/14/22 0440 10     Pain Loc --      Pain Edu? --      Excl. in GC? --     Most recent vital signs: Vitals:   07/14/22 0433 07/14/22 0444  BP: (!) 177/96   Pulse: 68   Resp: 16   Temp:  99 F (37.2 C)  SpO2: 97%      General: Awake, no distress.  CV:  Good peripheral perfusion.  Regular rate and rhythm Resp:  Normal effort.  CTAB  Abd:  No distention.  Other:  No calf pain or tenderness   ED Results / Procedures / Treatments   Labs (all labs ordered are listed, but only abnormal results are displayed) Labs Reviewed  BASIC METABOLIC PANEL - Abnormal; Notable for the following components:      Result Value   Chloride 112 (*)    Glucose, Bld 111 (*)    All other components within normal limits  CBC  TROPONIN I (HIGH SENSITIVITY)  TROPONIN I (HIGH SENSITIVITY)     EKG  ED ECG  REPORT I, 09/12/22, the attending physician, personally viewed and interpreted this ECG.  Date: 07/14/2022  Rhythm: normal sinus rhythm QRS Axis: normal Intervals: normal ST/T Wave abnormalities: normal Narrative Interpretation: no evidence of acute ischemia    RADIOLOGY Chest x-ray viewed interpreted by me, no pneumonia    PROCEDURES:  Critical Care performed:   Procedures   MEDICATIONS ORDERED IN ED: Medications  iohexol (OMNIPAQUE) 350 MG/ML injection 100 mL (75 mLs Intravenous Contrast Given 07/14/22 0810)  ketorolac (TORADOL) 30 MG/ML injection 30 mg (30 mg Intravenous Given 07/14/22 0912)     IMPRESSION / MDM / ASSESSMENT AND PLAN / ED COURSE  I reviewed the triage vital signs and the nursing notes. Patient's presentation is most consistent with acute presentation with potential threat to life or bodily function.  Patient presents with chest pain as detailed above.  Differential includes ACS, PE, pneumonia, musculoskeletal pain.  EKG is overall reassuring, high sensitive  troponin is normal.  Chest x-ray negative for pneumonia.  High risk for PE, will send for CT angiography.  Lab Work reviewed and is overall reassuring.  CT angiography negative for PE, patient aware of kidney cysts.  Treated with IV toradol as suspect MSK pain.   Considered admission but pain improving, workup very reassuring. Will have patient f/u closely with cards, strict return precautions.        FINAL CLINICAL IMPRESSION(S) / ED DIAGNOSES   Final diagnoses:  Chest wall pain     Rx / DC Orders   ED Discharge Orders          Ordered    Ambulatory referral to Cardiology       Comments: If you have not heard from the Cardiology office within the next 72 hours please call [phone].   07/14/22 0906    methocarbamol (ROBAXIN) 500 MG tablet  Every 8 hours PRN        07/14/22 0906    naproxen (NAPROSYN) 500 MG tablet  2 times daily with meals        07/14/22 0906              Note:  This document was prepared using Dragon voice recognition software and may include unintentional dictation errors.   Lavonia Drafts, MD 07/14/22 [phone]

## 2022-07-31 ENCOUNTER — Ambulatory Visit (INDEPENDENT_AMBULATORY_CARE_PROVIDER_SITE_OTHER): Payer: No Typology Code available for payment source

## 2022-07-31 ENCOUNTER — Ambulatory Visit (INDEPENDENT_AMBULATORY_CARE_PROVIDER_SITE_OTHER): Payer: No Typology Code available for payment source | Admitting: Podiatry

## 2022-07-31 ENCOUNTER — Other Ambulatory Visit: Payer: Self-pay | Admitting: Podiatry

## 2022-07-31 DIAGNOSIS — M79672 Pain in left foot: Secondary | ICD-10-CM

## 2022-07-31 DIAGNOSIS — M21622 Bunionette of left foot: Secondary | ICD-10-CM

## 2022-07-31 DIAGNOSIS — M79671 Pain in right foot: Secondary | ICD-10-CM

## 2022-07-31 DIAGNOSIS — M7751 Other enthesopathy of right foot: Secondary | ICD-10-CM | POA: Diagnosis not present

## 2022-07-31 MED ORDER — MELOXICAM 15 MG PO TABS: 15.0000 mg | ORAL_TABLET | Freq: Every day | ORAL | 1 refills | Status: DC

## 2022-07-31 MED ORDER — BETAMETHASONE SOD PHOS & ACET 6 (3-3) MG/ML IJ SUSP
3.0000 mg | Freq: Once | INTRAMUSCULAR | Status: AC
  Administered 2022-07-31: 3 mg via INTRA_ARTICULAR

## 2022-07-31 NOTE — Progress Notes (Signed)
   Chief Complaint  Patient presents with   Foot Pain    Bilateral foot and ankle pain, swelling, surgery on the right foot, X-rays taken today     Subjective:  62 y.o. female presenting today for evaluation of right ankle pain as well as left fifth toe pain.  Patient does have history of peroneal tenodesis 2016 performed at the [place].  Patient states that she has had intermittent pain and tenderness associated to the right ankle ever since that time.  She currently wears custom molded insoles with shoes from the [place].  Presenting for further treatment evaluation   Past Medical History:  Diagnosis Date   Anxiety    Back pain    Depression    Hypertension     Past Surgical History:  Procedure Laterality Date   CHOLECYSTECTOMY     MOHS SURGERY     to remove benign growth  located in the L low abdomen   R ankle surgery   2016   to repair lateral tendon    VAGINAL HYSTERECTOMY      Allergies  Allergen Reactions   Codeine Other (See Comments)   Hctz [Hydrochlorothiazide] Other (See Comments)    Causes her potassium to drop   Trimebutine    Sertraline Anxiety    Objective / Physical Exam:  General:  The patient is alert and oriented x3 in no acute distress. Dermatology:  Skin is warm, dry and supple bilateral lower extremities. Negative for open lesions or macerations. Vascular:  Palpable pedal pulses bilaterally. No edema or erythema noted. Capillary refill within normal limits. Neurological:  Epicritic and protective threshold grossly intact bilaterally.  Musculoskeletal Exam:  Pain on palpation to the anterior lateral medial aspects of the patient's right ankle. Mild edema noted. Range of motion within normal limits to all pedal and ankle joints bilateral. Muscle strength 5/5 in all groups bilateral.  Radiographic Exam RT foot and ankle 07/31/2022:  Normal osseous mineralization. Joint spaces preserved, however there may be some slight narrowing to the lateral shoulder of the  tibiotalar joint consistent with beginning stages of cartilage thinning and arthritis. No fracture/dislocation/boney destruction.   Radiographic exam LT foot 07/31/2022: Normal osseous mineralization.  Joint spaces preserved.  There is some lateral deviation of the fifth metatarsal head with some spurring consistent with a symptomatic tailor's bunion  Assessment: 1.  Capsulitis right ankle 2.  Tailor's bunion deformity left  Plan of Care:  1. Patient was evaluated. X-Rays reviewed.  2. Injection of 0.5 mL Celestone Soluspan injected in the patient's right ankle. 3.  Prescription provided for the patient to take to the [facility] [place] for custom molded insoles with shoes 4.  Prescription for meloxicam 15 mg daily to take as needed. 5.  Today we discussed possible surgery for the tailor's bunion however the patient states that currently it is mostly asymptomatic.  Will observe for now  6.  Return to clinic as needed   Edrick Kins, DPM [facility]  Dr. Edrick Kins, DPM    [address], [place] [address]                Office [phone]  Fax [phone]

## 2022-08-30 ENCOUNTER — Ambulatory Visit (INDEPENDENT_AMBULATORY_CARE_PROVIDER_SITE_OTHER): Payer: No Typology Code available for payment source | Admitting: Neurology

## 2022-08-30 ENCOUNTER — Other Ambulatory Visit: Payer: Self-pay

## 2022-08-30 ENCOUNTER — Encounter: Payer: Self-pay | Admitting: Neurology

## 2022-08-30 DIAGNOSIS — G4486 Cervicogenic headache: Secondary | ICD-10-CM

## 2022-08-30 DIAGNOSIS — M542 Cervicalgia: Secondary | ICD-10-CM

## 2022-08-30 DIAGNOSIS — G43009 Migraine without aura, not intractable, without status migrainosus: Secondary | ICD-10-CM

## 2022-08-30 DIAGNOSIS — G5603 Carpal tunnel syndrome, bilateral upper limbs: Secondary | ICD-10-CM

## 2022-08-30 NOTE — Procedures (Signed)
[facility] Neurology  [address]  [place], [place] [address] Tel: [phone] Fax: [phone] Test Date:  08/30/2022  Patient: Carolyn Gregory DOB: 1960/08/25 Physician: Narda Amber, DO  Sex: Female Height: 5' 6"$  Ref Phys: Yehuda Savannah, MD  ID#: [id number]   Technician:    History: This is a 62 year old female referred for evaluation of bilateral hand paresthesias.  NCV & EMG Findings: Extensive electrodiagnostic testing of the right upper extremity and additional studies of the left shows:  Bilateral median sensory responses show prolonged latency (R4.3, L4.5 ms) and reduced amplitude (R11.4, L12.7 V).  Bilateral ulnar sensory responses are within normal limits. Bilateral median motor responses show prolonged latency (R4.4, L4.1 ms).  Bilateral ulnar motor responses are within normal limits.   There is no evidence of active or chronic motor axonal loss changes affecting any of the tested muscles.  Motor unit configuration and recruitment pattern is within normal limits.    Impression: Bilateral median neuropathy at or distal to the wrist, consistent with a clinical diagnosis of carpal tunnel syndrome.  Overall, these findings are moderate in degree electrically.,   ___________________________ Narda Amber, DO    Nerve Conduction Studies   Stim Site NR Peak (ms) Norm Peak (ms) O-P Amp (V) Norm O-P Amp  Left Median Anti Sensory (2nd Digit)  32 C  Wrist    *4.5 <3.6 *12.7 >15  Right Median Anti Sensory (2nd Digit)  32 C  Wrist    *4.3 <3.6 *11.4 >15  Left Ulnar Anti Sensory (5th Digit)  32 C  Wrist    2.8 <3.1 22.8 >10  Right Ulnar Anti Sensory (5th Digit)  32 C  Wrist    2.9 <3.1 23.1 >10     Stim Site NR Onset (ms) Norm Onset (ms) O-P Amp (mV) Norm O-P Amp Site1 Site2 Delta-0 (ms) Dist (cm) Vel (m/s) Norm Vel (m/s)  Left Median Motor (Abd Poll Brev)  32 C  Wrist    *4.1 <4.0 11.4 >6 Elbow Wrist 5.2 28.0 54 >50  Elbow    9.3  11.3          Right Median Motor (Abd Poll Brev)  32 C  Wrist    *4.4 <4.0 10.3 >6 Elbow Wrist 5.2 28.0 54 >50  Elbow    9.6  9.4         Left Ulnar Motor (Abd Dig Minimi)  32 C  Wrist    2.5 <3.1 11.1 >7 B Elbow Wrist 3.8 22.0 58 >50  B Elbow    6.3  10.2  A Elbow B Elbow 1.8 10.0 56 >50  A Elbow    8.1  9.8         Right Ulnar Motor (Abd Dig Minimi)  32 C  Wrist    2.3 <3.1 10.1 >7 B Elbow Wrist 4.2 24.0 57 >50  B Elbow    6.5  10.1  A Elbow B Elbow 1.8 10.0 56 >50  A Elbow    8.3  10.0          Electromyography   Side Muscle Ins.Act Fibs Fasc Recrt Amp Dur Poly Activation Comment  Right 1stDorInt Nml Nml Nml Nml Nml Nml Nml Nml N/A  Right Abd Poll Brev Nml Nml Nml Nml Nml Nml Nml Nml N/A  Right PronatorTeres Nml Nml Nml Nml Nml Nml Nml Nml N/A  Right Biceps Nml Nml Nml Nml Nml Nml Nml Nml N/A  Right Triceps Nml Nml Nml  Nml Nml Nml Nml Nml N/A  Right Deltoid Nml Nml Nml Nml Nml Nml Nml Nml N/A  Left 1stDorInt Nml Nml Nml Nml Nml Nml Nml Nml N/A  Left Abd Poll Brev Nml Nml Nml Nml Nml Nml Nml Nml N/A  Left PronatorTeres Nml Nml Nml Nml Nml Nml Nml Nml N/A  Left Biceps Nml Nml Nml Nml Nml Nml Nml Nml N/A  Left Triceps Nml Nml Nml Nml Nml Nml Nml Nml N/A  Left Deltoid Nml Nml Nml Nml Nml Nml Nml Nml N/A      Waveforms:

## 2022-08-31 ENCOUNTER — Other Ambulatory Visit: Payer: Self-pay

## 2022-08-31 DIAGNOSIS — R202 Paresthesia of skin: Secondary | ICD-10-CM

## 2022-11-15 NOTE — Progress Notes (Signed)
NEUROLOGY FOLLOW UP OFFICE NOTE  Carolyn Gregory [id number]  Assessment/Plan:   Migraine without aura, without status migrainosus, not intractable Cervicalgia   Follow up on referral to physical therapy for cervicalgia Increase Flexeril to 10mg  three times daily as needed. Limit use of pain relievers to no more than 2 days out of week to prevent risk of rebound or medication-overuse headache. Follow up 6 months.     Subjective:  Carolyn Gregory. Carolyn Gregory is a 62 year old right-handed female with back pain, HTN and depression/anxiety who follows up for headache and neck pain.   UPDATE: Increased flexeril She has still hasn't received PT for her neck [facility].  [facility]  Neck muscles feel tighter and has less range of motion.  Has numbness in the hands.  NCV-EMG upper extremities on 08/30/2022 showed bilateral moderate CTS.  She is supposed to be referred to a hand specialist.    Overall, migraines occurring twice a week.     Current NSAIDS/analgesics:  none Current triptans:  none Current ergotamine:  none Current anti-emetic:  none Current muscle relaxants:  Flexeril 10mg  QHS Current Antihypertensive medications:  lisinopril Current Antidepressant medications:  none Current Anticonvulsant medications:   Current anti-CGRP:  none Current Vitamins/Herbal/Supplements:  Magnesium oxide 400mg  TID Current Antihistamines/Decongestants:  Cetirizine  Other therapy:  none Hormone/birth control:  Estradiol   Caffeine:  No coffee.  Tea infrequently.  No soda Diet:  Keeps hydrated.  Skips meals. No soda Exercise:  walks Depression:  yes; Anxiety:  Yes, gotten worse. Other pain:  Chronic pain in back, knee Sleep hygiene:  Trouble sleeping. Related to stress.    HISTORY:  She has had migraines since age 77.  They are typically a 4-5/10 non-throbbing pressure across the forehead with nausea, photophobia and phonophobia but they have changed around 2018  associated with elevated blood pressure and her chronic pain.  She developed new severe headaches in December 2021, a 10/10 pressure over the top of her head. It lasted for several hours.  Associated nausea, photophobia and phonophobia, black floaters in vision, dyspnea and increased sound of her tinnitus but no vomiting.  She couldn't rest.  They have been occurring 3 days a week since onset. She reports similar episode in 2019 related to stress.  Blood pressure has been elevated, reportedly 160s systolic.  MRI of brain with and without contrast and MRA of head on 07/22/2020 were normal.  Endorsed neck pain.  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   Past NSAIDS/analgesics:  Codeine (side effect), ibuprofen, ketorolac, Mobic, tramadol Past abortive triptans:  none Past abortive ergotamine:  none Past muscle relaxants:  Robaxin Past anti-emetic:  Zofran ODT 4mg  Past antihypertensive medications:  none Past antidepressant medications:  Sertraline, Wellbutrin Past anticonvulsant medications:  gabapentin (caused eye twitching), topiramate (upset her stomach) Past anti-CGRP:  none Past vitamins/Herbal/Supplements:  none Past antihistamines/decongestants:  none Other past therapies:  none     Family history of headache:  She is not aware  PAST MEDICAL HISTORY: Past Medical History:  Diagnosis Date   Anxiety    Back pain    Depression    Hypertension     MEDICATIONS: Current Outpatient Medications on File Prior to Visit  Medication Sig Dispense Refill   aluminum chloride (DRYSOL) 20 % external solution APPLY SMALL AMOUNT TOPICALLY AS DIRECTED APPLY TO CLEAN UNDERARMS AT NIGHT AND WASH OFF IN THE MORNING. USE  2  NIGHTS A WEEK AND IF TOLERATING CAN INCREASE TO EVERY OTHER NIGHT. APPLY TO CLEAN UNDERARMS AT NIGHT AND WASH OFF IN THE MORNING. USE 2   NIGHTS A WEEK AND IF TOLERATING CAN INCREASE TO EVERY OTHER NIGHT.      Benzoyl Peroxide 5 % lotion APPLY SMALL AMOUNT TOPICALLY AS DIRECTED APPLY TO GROIN AREA EVERY OTHER DAY TO DAILY     cholecalciferol (VITAMIN D) 400 units TABS tablet Take 1,000 Units by mouth.     cyclobenzaprine (FLEXERIL) 10 MG tablet Take 1 tablet (10 mg total) by mouth at bedtime. 30 tablet 5   Dimethicone-Petrolatum 1-30 % CREA APPLY MODERATE AMOUNT TOPICALLY EVERY DAY USE AS NEEDED ON LOWER CHEST TO CONTROL RASH     estradiol (ESTRACE) 0.1 MG/GM vaginal cream APPLY ONE GRAM INTO TOPICALLY AS DIRECTED TUESDAYS AND THURSDAYS EXTERNALLY AT BEDTIME  ONLY TO AREAS OF CONCERN     ketoconazole (NIZORAL) 2 % shampoo      lisinopril (ZESTRIL) 40 MG tablet Take 40 mg by mouth.     meloxicam (MOBIC) 15 MG tablet Take 1 tablet (15 mg total) by mouth daily. 30 tablet 1   methocarbamol (ROBAXIN) 500 MG tablet Take 1 tablet (500 mg total) by mouth every 8 (eight) hours as needed for muscle spasms. 20 tablet 1   naproxen (NAPROSYN) 500 MG tablet Take 1 tablet (500 mg total) by mouth 2 (two) times daily with a meal. 20 tablet 2   polyvinyl alcohol (LIQUIFILM TEARS) 1.4 % ophthalmic solution INSTILL ONE DROP IN [facility] EYE TWO TIMES A DAY FOR DRY EYES     No current facility-administered medications on file prior to visit.    ALLERGIES: Allergies  Allergen Reactions   Codeine Other (See Comments)   Hctz [Hydrochlorothiazide] Other (See Comments)    Causes her potassium to drop   Trimebutine    Sertraline Anxiety    FAMILY HISTORY: Family History  Problem Relation Age of Onset   Diabetes Mother       Objective:  Blood pressure 124/80, pulse 98, height 5\' 7"  (1.702 m), weight 255 lb (115.7 kg), SpO2 98 %. General: No acute distress.  Patient appears well-groomed.   Head:  Normocephalic/atraumatic Eyes:  Fundi examined but not visualized Neck: supple, left sided paraspinal tenderness, reduced range of motion bilateral neck turn, side bending Heart:  Regular rate and rhythm Lungs:  Clear to  auscultation bilaterally Back: No paraspinal tenderness Neurological Exam: alert and oriented to person, place, and time.  Speech fluent and not dysarthric, language intact.  CN II-XII intact. Bulk and tone normal, muscle strength 5/5 throughout.  Sensation to light touch intact.  Deep tendon reflexes 2+ throughout, toes downgoing.  Finger to nose testing intact.  Gait normal, Romberg negative.   Shon Millet, DO  CC: Yvette Rack, MD

## 2022-11-19 ENCOUNTER — Ambulatory Visit (INDEPENDENT_AMBULATORY_CARE_PROVIDER_SITE_OTHER): Payer: No Typology Code available for payment source | Admitting: Neurology

## 2022-11-19 ENCOUNTER — Encounter: Payer: Self-pay | Admitting: Neurology

## 2022-11-19 VITALS — BP 124/80 | HR 98 | Ht 67.0 in | Wt 255.0 lb

## 2022-11-19 DIAGNOSIS — G4486 Cervicogenic headache: Secondary | ICD-10-CM | POA: Diagnosis not present

## 2022-11-19 DIAGNOSIS — G43009 Migraine without aura, not intractable, without status migrainosus: Secondary | ICD-10-CM | POA: Diagnosis not present

## 2022-11-19 DIAGNOSIS — M542 Cervicalgia: Secondary | ICD-10-CM

## 2022-11-19 MED ORDER — CYCLOBENZAPRINE HCL 10 MG PO TABS: 10.0000 mg | ORAL_TABLET | Freq: Three times a day (TID) | ORAL | 5 refills | Status: DC | PRN

## 2022-11-19 NOTE — Patient Instructions (Signed)
May take cyclobenzaprine 10mg  three times daily as needed.  Caution for drowsiness Physical therapy for neck pain Follow up 6 months.

## 2022-12-31 ENCOUNTER — Telehealth: Payer: Self-pay

## 2022-12-31 NOTE — Telephone Encounter (Signed)
Telephone call to the [facility], Spoke to [place]. Will reach out to the Neurology team to get an update will call us back.

## 2023-01-08 ENCOUNTER — Other Ambulatory Visit: Payer: Self-pay

## 2023-01-08 ENCOUNTER — Encounter: Payer: Self-pay | Admitting: Emergency Medicine

## 2023-01-08 ENCOUNTER — Emergency Department
Admission: EM | Admit: 2023-01-08 | Discharge: 2023-01-08 | Disposition: A | Discharge status: Home / Self Care | Payer: No Typology Code available for payment source | Attending: Emergency Medicine | Admitting: Emergency Medicine

## 2023-01-08 ENCOUNTER — Emergency Department: Payer: No Typology Code available for payment source

## 2023-01-08 DIAGNOSIS — M546 Pain in thoracic spine: Secondary | ICD-10-CM | POA: Insufficient documentation

## 2023-01-08 DIAGNOSIS — M545 Low back pain, unspecified: Secondary | ICD-10-CM | POA: Insufficient documentation

## 2023-01-08 DIAGNOSIS — I10 Essential (primary) hypertension: Secondary | ICD-10-CM | POA: Diagnosis not present

## 2023-01-08 LAB — URINALYSIS, ROUTINE W REFLEX MICROSCOPIC
Bilirubin Urine: NEGATIVE
Glucose, UA: NEGATIVE mg/dL
Hgb urine dipstick: NEGATIVE
Ketones, ur: NEGATIVE mg/dL
Leukocytes,Ua: NEGATIVE
Nitrite: NEGATIVE
Protein, ur: NEGATIVE mg/dL
Specific Gravity, Urine: 1.004 — ABNORMAL LOW (ref 1.005–1.030)
pH: 5 (ref 5.0–8.0)

## 2023-01-08 MED ORDER — ACETAMINOPHEN 500 MG PO TABS
1000.0000 mg | ORAL_TABLET | Freq: Once | ORAL | Status: AC
  Administered 2023-01-08: 1000 mg via ORAL
  Filled 2023-01-08: qty 2

## 2023-01-08 MED ORDER — DEXAMETHASONE SODIUM PHOSPHATE 10 MG/ML IJ SOLN
10.0000 mg | Freq: Once | INTRAMUSCULAR | Status: AC
  Administered 2023-01-08: 10 mg via INTRAMUSCULAR
  Filled 2023-01-08: qty 1

## 2023-01-08 MED ORDER — PREDNISONE 10 MG PO TABS: 10.0000 mg | ORAL_TABLET | Freq: Every day | ORAL | 0 refills | Status: DC

## 2023-01-08 MED ORDER — PREDNISONE 10 MG PO TABS: 20.0000 mg | ORAL_TABLET | Freq: Every day | ORAL | 0 refills | Status: AC

## 2023-01-08 MED ORDER — LIDOCAINE 5 % EX PTCH
1.0000 | MEDICATED_PATCH | CUTANEOUS | Status: DC
  Administered 2023-01-08: 1 via TRANSDERMAL
  Filled 2023-01-08: qty 1

## 2023-01-08 NOTE — Discharge Instructions (Signed)
Your exam, labs, and CT scans are normal and reassuring at this time. Take the prescription meds as directed. Follow-up with the [facility] for ongoing concerns.

## 2023-01-08 NOTE — ED Provider Notes (Signed)
[facility] Emergency Department Provider Note     Event Date/Time   First MD Initiated Contact with Patient 01/08/23 1821     (approximate)   History   Back Pain   HPI  Carolyn Gregory is a 62 y.o. female with a history of hypertension and anxiety who presents to the ED for evaluation of moderate back pain.  Onset a couple hours ago while walking up steps at home.  Location mid back and lower back.  Endorses descending radiation down right leg.  Quality is described as pulling and constant.  Pain exacerbated with lifting and sitting up straight and standing.  Pain is better with leaning forward. Pain 9/10. endorses history of kidney stone. Patient has never experienced this type of pain before. Endorses numbness in right lower extremity.  Denies fever, dysuria, and hematuria.      Physical Exam   Triage Vital Signs: ED Triage Vitals  Enc Vitals Group     BP 01/08/23 1814 120/78     Pulse Rate 01/08/23 1813 77     Resp 01/08/23 1813 18     Temp 01/08/23 1814 98.4 F (36.9 C)     Temp Source 01/08/23 1814 Oral     SpO2 01/08/23 1813 100 %     Weight 01/08/23 1813 255 lb 1.2 oz (115.7 kg)     Height 01/08/23 1813 5\' 7"  (1.702 m)     Head Circumference --      Peak Flow --      Pain Score 01/08/23 1815 10     Pain Loc --      Pain Edu? --      Excl. in GC? --     Most recent vital signs: Vitals:   01/08/23 2000 01/08/23 2050  BP: 109/72 124/70  Pulse: (!) 59 64  Resp: 20 18  Temp:    SpO2: 100% 100%   General: Alert and oriented x 3. INAD. Anxious.    Head:  NCAT.  Eyes:  PERRLA. EOMI.  Neck:   No cervical spine tenderness to palpation. AROM without difficulty. CV:  Good peripheral perfusion. RRR.  RESP:  Normal effort. LCTAB.  ABD:  No distention. Soft, Non tender. No CVA tenderness bilaterally.  BACK:  Spinous process is midline without deformity. Thoracic midline tenderness. Lumbar midline tenderness and left paraspinal lumbar  muscle tenderness. (+) SLRT right.  MSK:   Full ROM in all joints. No swelling, deformity or tenderness.  NEURO: Cranial nerves intact. No focal deficits. Sensation and motor function intact. Gait is steady.    ED Results / Procedures / Treatments   Labs (all labs ordered are listed, but only abnormal results are displayed) Labs Reviewed  URINALYSIS, ROUTINE W REFLEX MICROSCOPIC - Abnormal; Notable for the following components:      Result Value   Color, Urine STRAW (*)    APPearance CLEAR (*)    Specific Gravity, Urine 1.004 (*)    All other components within normal limits   RADIOLOGY  I personally viewed and evaluated these images as part of my medical decision making, as well as reviewing the written report by the radiologist.  ED Provider Interpretation: CT Lumbar and Thoracic are unremarkable of any acute abnormalities.   CT Lumbar Spine Wo Contrast  Result Date: 01/08/2023 CLINICAL DATA:  Lumbar radiculopathy, infection suspected, no prior imaging EXAM: CT LUMBAR SPINE WITHOUT CONTRAST TECHNIQUE: Multidetector CT imaging of the lumbar spine was performed without intravenous contrast administration.  Multiplanar CT image reconstructions were also generated. RADIATION DOSE REDUCTION: This exam was performed according to the departmental dose-optimization program which includes automated exposure control, adjustment of the mA and/or kV according to patient size and/or use of iterative reconstruction technique. COMPARISON:  Abdominal CT 06/26/2017. FINDINGS: Segmentation: 5 lumbar type vertebrae. Alignment: Normal Vertebrae: No acute fracture or focal pathologic process. Paraspinal and other soft tissues: Large bilateral renal cystic lesions partially visualized, likely cysts but difficult to characterize on this study. These appear similar to prior study. Disc levels: Maintained.  No disc herniation. IMPRESSION: No acute bony abnormality. Large bilateral renal cystic lesions, appear similar  to prior abdominal CT from 06/26/2017, most likely benign cysts. Electronically Signed   By: Charlett Nose M.D.   On: 01/08/2023 20:22   CT THORACIC SPINE WO CONTRAST  Result Date: 01/08/2023 CLINICAL DATA:  Back trauma, no prior imaging (Age >= 16y) Thoracic radiculopathy, infection suspected, no prior imaging EXAM: CT THORACIC SPINE WITHOUT CONTRAST TECHNIQUE: Multidetector CT images of the thoracic were obtained using the standard protocol without intravenous contrast. RADIATION DOSE REDUCTION: This exam was performed according to the departmental dose-optimization program which includes automated exposure control, adjustment of the mA and/or kV according to patient size and/or use of iterative reconstruction technique. COMPARISON:  Chest CT 07/14/2022 FINDINGS: Alignment: Normal Vertebrae: No acute fracture or focal pathologic process. Paraspinal and other soft tissues: Large bilateral renal cystic lesions partially visualized. No paraspinal abnormality. Disc levels: Mild anterior spurring in the mid and lower thoracic spine. No disc herniation. IMPRESSION: No acute bony abnormality. Large bilateral renal cystic lesions visualized, difficult to characterize due to partial visualization and no contrast. These could be further evaluated with elective renal ultrasound. Electronically Signed   By: Charlett Nose M.D.   On: 01/08/2023 20:19    PROCEDURES:  Critical Care performed: No  Procedures   MEDICATIONS ORDERED IN ED: Medications  lidocaine (LIDODERM) 5 % 1 patch (1 patch Transdermal Patch Applied 01/08/23 1855)  acetaminophen (TYLENOL) tablet 1,000 mg (1,000 mg Oral Given 01/08/23 1852)  dexamethasone (DECADRON) injection 10 mg (10 mg Intramuscular Given 01/08/23 1955)     IMPRESSION / MDM / ASSESSMENT AND PLAN / ED COURSE  I reviewed the triage vital signs and the nursing notes.                              Clinical Course as of 01/08/23 2222  Tue Jan 08, 2023  1825 Pulling constant   Walking up steps Shooting pain and numbness Pain worse on right side, lifting wosens pain. Sitting throbbing.  Taking a deep breath in worsens pain.   [MH]  1827 Thoracic midline.  [MH]  1830 Hx of kidney stones  [MH]  1834 Pain 9/10 [MH]  1953 CT THORACIC SPINE WO CONTRAST [MH]    Clinical Course User Index [MH] Kern Reap A, PA-C   62 y.o. female presents to the emergency department for evaluation and treatment of acute back pain with descending unilateral radiation. See HPI for further details.  Initial vital signs are stable.  Physical exam is pertinent for thoracic and lumbar midline tenderness with associated paraspinal muscle tenderness, positive right straight leg raise test.  Differential diagnosis includes, but is not limited to fracture, muscle strain, degenerative disease, sciatica, nephrolithiasis.    Patient is administered Tylenol, Decadron and a lidocaine patch for her symptoms. Given history of kidney stones U/A ordered. Reassuring.  Patient is extremely  anxious and requesting for spinal imaging discussing her clinical presentation did not warrant imaging at this time. We briefly discussed the risk vs benefits of CT scans and radiation exposure, with shared decision making and reviewing her chart , CT scans were ordered and results are pending.  ----------------------------------------- 8:06 PM on 01/08/2023 -----------------------------------------  Blood pressure 109/72, pulse 59, temperature 98.4 F (36.9 C), temperature source Oral, resp. rate 20, height 5\' 7"  (1.702 m), weight 115.7 kg, SpO2 100 %.  Carolyn Bacon Menshew, PA-C will be assuming care for this patient.  In short, Carolyn Gregory is a 62 y.o. female with a chief complaint of Back Pain Refer to the original H&P for additional details.  The current plan of care is to discharge patient home if CT scans are reassuring. Patient will be prescribed prednisone and flexeril. She is to follow up with her  [facility] and [facility]ics in 1 week. All questions and concerns were addressed during this ED visit.    Patient's presentation is most consistent with acute complicated illness / injury requiring diagnostic workup.  FINAL CLINICAL IMPRESSION(S) / ED DIAGNOSES   Final diagnoses:  Acute midline thoracic back pain     Rx / DC Orders   ED Discharge Orders          Ordered    predniSONE (DELTASONE) 10 MG tablet  Daily,   Status:  Discontinued        01/08/23 1956    predniSONE (DELTASONE) 10 MG tablet  Daily        01/08/23 2038             Note:  This document was prepared using Dragon voice recognition software and may include unintentional dictation errors.    Romeo Apple, Benjamin Merrihew A, PA-C 01/08/23 2223    Jene Every, MD 01/11/23 [phone]

## 2023-01-08 NOTE — ED Triage Notes (Signed)
Pt states she was going up a step and felt a shooting pain from her lower back up her spine to her neck. Pt now has a pain from the back of her right thigh down her leg. Pt stable in triage.

## 2023-01-09 NOTE — ED Provider Notes (Signed)
[facility] Emergency Department Provider Note     Event Date/Time   First MD Initiated Contact with Patient 01/08/23 1821     (approximate)   History   Back Pain   HPI  Carolyn Gregory is a 62 y.o. female presented to the ED with mid line back pain intermittently since since yesterday.  I received the patient in handoff by from my Romeo Apple at the end of her shift, while CT images are pending.  Plan of care is to await results and disposition the patient accordingly.   Physical Exam   Triage Vital Signs: ED Triage Vitals  Enc Vitals Group     BP 01/08/23 1814 120/78     Pulse Rate 01/08/23 1813 77     Resp 01/08/23 1813 18     Temp 01/08/23 1814 98.4 F (36.9 C)     Temp Source 01/08/23 1814 Oral     SpO2 01/08/23 1813 100 %     Weight 01/08/23 1813 255 lb 1.2 oz (115.7 kg)     Height 01/08/23 1813 5\' 7"  (1.702 m)     Head Circumference --      Peak Flow --      Pain Score 01/08/23 1815 10     Pain Loc --      Pain Edu? --      Excl. in GC? --     Most recent vital signs: Vitals:   01/08/23 2000 01/08/23 2050  BP: 109/72 124/70  Pulse: (!) 59 64  Resp: 20 18  Temp:    SpO2: 100% 100%     ED Results / Procedures / Treatments   Labs (all labs ordered are listed, but only abnormal results are displayed) Labs Reviewed  URINALYSIS, ROUTINE W REFLEX MICROSCOPIC - Abnormal; Notable for the following components:      Result Value   Color, Urine STRAW (*)    APPearance CLEAR (*)    Specific Gravity, Urine 1.004 (*)    All other components within normal limits     EKG   RADIOLOGY  I personally viewed and evaluated these images as part of my medical decision making, as well as reviewing the written report by the radiologist.  ED Provider Interpretation: No acute findings  CT Lumbar Spine Wo Contrast  Result Date: 01/08/2023 CLINICAL DATA:  Lumbar radiculopathy, infection suspected, no prior imaging EXAM: CT LUMBAR SPINE  WITHOUT CONTRAST TECHNIQUE: Multidetector CT imaging of the lumbar spine was performed without intravenous contrast administration. Multiplanar CT image reconstructions were also generated. RADIATION DOSE REDUCTION: This exam was performed according to the departmental dose-optimization program which includes automated exposure control, adjustment of the mA and/or kV according to patient size and/or use of iterative reconstruction technique. COMPARISON:  Abdominal CT 06/26/2017. FINDINGS: Segmentation: 5 lumbar type vertebrae. Alignment: Normal Vertebrae: No acute fracture or focal pathologic process. Paraspinal and other soft tissues: Large bilateral renal cystic lesions partially visualized, likely cysts but difficult to characterize on this study. These appear similar to prior study. Disc levels: Maintained.  No disc herniation. IMPRESSION: No acute bony abnormality. Large bilateral renal cystic lesions, appear similar to prior abdominal CT from 06/26/2017, most likely benign cysts. Electronically Signed   By: Charlett Nose M.D.   On: 01/08/2023 20:22   CT THORACIC SPINE WO CONTRAST  Result Date: 01/08/2023 CLINICAL DATA:  Back trauma, no prior imaging (Age >= 16y) Thoracic radiculopathy, infection suspected, no prior imaging EXAM: CT THORACIC SPINE WITHOUT CONTRAST  TECHNIQUE: Multidetector CT images of the thoracic were obtained using the standard protocol without intravenous contrast. RADIATION DOSE REDUCTION: This exam was performed according to the departmental dose-optimization program which includes automated exposure control, adjustment of the mA and/or kV according to patient size and/or use of iterative reconstruction technique. COMPARISON:  Chest CT 07/14/2022 FINDINGS: Alignment: Normal Vertebrae: No acute fracture or focal pathologic process. Paraspinal and other soft tissues: Large bilateral renal cystic lesions partially visualized. No paraspinal abnormality. Disc levels: Mild anterior spurring in  the mid and lower thoracic spine. No disc herniation. IMPRESSION: No acute bony abnormality. Large bilateral renal cystic lesions visualized, difficult to characterize due to partial visualization and no contrast. These could be further evaluated with elective renal ultrasound. Electronically Signed   By: Charlett Nose M.D.   On: 01/08/2023 20:19     PROCEDURES:  Critical Care performed: No  Procedures   MEDICATIONS ORDERED IN ED: Medications  acetaminophen (TYLENOL) tablet 1,000 mg (1,000 mg Oral Given 01/08/23 1852)  dexamethasone (DECADRON) injection 10 mg (10 mg Intramuscular Given 01/08/23 1955)     IMPRESSION / MDM / ASSESSMENT AND PLAN / ED COURSE  I reviewed the triage vital signs and the nursing notes.                              Differential diagnosis includes, but is not limited to, lumbar strain, lumbar radiculopathy, myalgias, compression fracture  Patient's presentation is most consistent with acute complicated illness / injury requiring diagnostic workup.  Patient's diagnosis is consistent with myalgias and muscle strain. Patient will be discharged home with prescriptions for prednisone. Patient is to follow up with the [facility] as needed or otherwise directed. Patient is given ED precautions to return to the ED for any worsening or new symptoms.  Clinical Course as of 01/09/23 1510  Tue Jan 08, 2023  1825 Pulling constant  Walking up steps Shooting pain and numbness Pain worse on right side, lifting wosens pain. Sitting throbbing.  Taking a deep breath in worsens pain.   [MH]  1827 Thoracic midline.  [MH]  1830 Hx of kidney stones  [MH]  1834 Pain 9/10 [MH]  1953 CT THORACIC SPINE WO CONTRAST [MH]    Clinical Course User Index [MH] Romeo Apple, Myah A, PA-C    FINAL CLINICAL IMPRESSION(S) / ED DIAGNOSES   Final diagnoses:  Acute midline thoracic back pain     Rx / DC Orders   ED Discharge Orders          Ordered    predniSONE (DELTASONE) 10  MG tablet  Daily,   Status:  Discontinued        01/08/23 1956    predniSONE (DELTASONE) 10 MG tablet  Daily        01/08/23 2038             Note:  This document was prepared using Dragon voice recognition software and may include unintentional dictation errors.    Lissa Hoard, PA-C 01/10/23 Donnamarie Rossetti, MD 01/10/23 [phone]

## 2023-02-23 ENCOUNTER — Other Ambulatory Visit: Payer: Self-pay

## 2023-02-23 ENCOUNTER — Emergency Department
Admission: EM | Admit: 2023-02-23 | Discharge: 2023-02-23 | Disposition: A | Discharge status: Home / Self Care | Payer: No Typology Code available for payment source | Attending: Emergency Medicine | Admitting: Emergency Medicine

## 2023-02-23 DIAGNOSIS — R5383 Other fatigue: Secondary | ICD-10-CM | POA: Diagnosis not present

## 2023-02-23 DIAGNOSIS — Z20822 Contact with and (suspected) exposure to covid-19: Secondary | ICD-10-CM | POA: Insufficient documentation

## 2023-02-23 DIAGNOSIS — I10 Essential (primary) hypertension: Secondary | ICD-10-CM | POA: Insufficient documentation

## 2023-02-23 DIAGNOSIS — J029 Acute pharyngitis, unspecified: Secondary | ICD-10-CM | POA: Diagnosis present

## 2023-02-23 LAB — RESP PANEL BY RT-PCR (RSV, FLU A&B, COVID)  RVPGX2
Influenza A by PCR: NEGATIVE
Influenza B by PCR: NEGATIVE
Resp Syncytial Virus by PCR: NEGATIVE
SARS Coronavirus 2 by RT PCR: NEGATIVE

## 2023-02-23 LAB — GROUP A STREP BY PCR: Group A Strep by PCR: NOT DETECTED

## 2023-02-23 NOTE — ED Provider Notes (Signed)
[facility] Emergency Department Provider Note     Event Date/Time   First MD Initiated Contact with Patient 02/23/23 2051     (approximate)   History   No chief complaint on file.   HPI  Carolyn Gregory is a 62 y.o. female with a history of HTN and anxiety presents to the emergency department for possible COVID exposure while at work this past Tuesday.  She is requesting a COVID and strep test.  Associated symptoms include fatigue more than normal.  Patient denies chest pain and shortness of breath.  No fever.  No other complaints.     Physical Exam   Triage Vital Signs: ED Triage Vitals [02/23/23 2037]  Encounter Vitals Group     BP (!) 144/75     Systolic BP Percentile      Diastolic BP Percentile      Pulse Rate (!) 55     Resp 18     Temp 98.5 F (36.9 C)     Temp Source Oral     SpO2 95 %     Weight      Height      Head Circumference      Peak Flow      Pain Score 3     Pain Loc      Pain Education      Exclude from Growth Chart     Most recent vital signs: Vitals:   02/23/23 2037 02/23/23 2046  BP: (!) 144/75 125/76  Pulse: (!) 55   Resp: 18   Temp: 98.5 F (36.9 C)   SpO2: 95%     General Awake, no distress.  Well-appearing.  Nontoxic. Anxious. HEENT NCAT. PERRL. EOMI. No rhinorrhea.  Oropharynx is clear.  No edema or exudates. CV:  Good peripheral perfusion.  RRR RESP:  Normal effort.  CTAB ABD:  No distention.   ED Results / Procedures / Treatments   Labs (all labs ordered are listed, but only abnormal results are displayed) Labs Reviewed  RESP PANEL BY RT-PCR (RSV, FLU A&B, COVID)  [id number]  GROUP A STREP BY PCR   No results found.  PROCEDURES:  Critical Care performed: No  Procedures  MEDICATIONS ORDERED IN ED: Medications - No data to display  IMPRESSION / MDM / ASSESSMENT AND PLAN / ED COURSE  I reviewed the triage vital signs and the nursing notes.                               62 y.o.  female presents to the emergency department for evaluation and treatment of COVID exposure. See HPI for further details.   Differential diagnosis includes, but is not limited to COVID, strep pharyngitis, viral URI, anxiety.  Patient's presentation is most consistent with acute complicated illness / injury requiring diagnostic workup.  Patient's presentation is benign.  COVID and strep ordered in triage revealing negative results.  This is reassuring.  Patient denies a cough and is seeking more of reassurance at this time.  Patient is in stable condition for discharge.  Encouraged to follow-up with primary care as needed.  Patient is given ED precautions to return to the ED for any worsening or new symptoms. Patient verbalizes understanding. All questions and concerns were addressed during ED visit.     FINAL CLINICAL IMPRESSION(S) / ED DIAGNOSES   Final diagnoses:  Sore throat   Rx / DC Orders  ED Discharge Orders     None        Note:  This document was prepared using Dragon voice recognition software and may include unintentional dictation errors.    Romeo Apple, Anikin Prosser A, PA-C 02/23/23 2215    Chesley Noon, MD 02/23/23 [phone]

## 2023-02-23 NOTE — ED Notes (Signed)
Patient states she was in contact with coworker and received phone call that she was exposed to Covid.  The patient states she feels fatigued x 2-3 days.  Throat irritated, productive cough (scant sputum).  SOB while lying flat.   No signs of distress.

## 2023-02-23 NOTE — ED Triage Notes (Signed)
Pt was exposed to someone with covid at work, c/o feeling sluggish and slight sore throat. Pt denies CP, SHOB, cough.

## 2023-02-23 NOTE — Discharge Instructions (Signed)
Your COVID test is negative.  Your strep test is negative.  Continue to drink plenty of fluids and rest.

## 2023-03-21 ENCOUNTER — Encounter: Payer: Self-pay | Admitting: Emergency Medicine

## 2023-03-21 ENCOUNTER — Emergency Department: Payer: No Typology Code available for payment source

## 2023-03-21 ENCOUNTER — Emergency Department
Admission: EM | Admit: 2023-03-21 | Discharge: 2023-03-21 | Disposition: A | Discharge status: Home / Self Care | Payer: No Typology Code available for payment source | Attending: Emergency Medicine | Admitting: Emergency Medicine

## 2023-03-21 ENCOUNTER — Other Ambulatory Visit: Payer: Self-pay

## 2023-03-21 DIAGNOSIS — R1013 Epigastric pain: Secondary | ICD-10-CM | POA: Insufficient documentation

## 2023-03-21 DIAGNOSIS — I10 Essential (primary) hypertension: Secondary | ICD-10-CM | POA: Insufficient documentation

## 2023-03-21 LAB — URINALYSIS, ROUTINE W REFLEX MICROSCOPIC
Bilirubin Urine: NEGATIVE
Glucose, UA: NEGATIVE mg/dL
Hgb urine dipstick: NEGATIVE
Ketones, ur: NEGATIVE mg/dL
Leukocytes,Ua: NEGATIVE
Nitrite: NEGATIVE
Protein, ur: NEGATIVE mg/dL
Specific Gravity, Urine: 1.004 — ABNORMAL LOW (ref 1.005–1.030)
pH: 5 (ref 5.0–8.0)

## 2023-03-21 LAB — CBC
HCT: 37.6 % (ref 36.0–46.0)
Hemoglobin: 12.4 g/dL (ref 12.0–15.0)
MCH: 28.6 pg (ref 26.0–34.0)
MCHC: 33 g/dL (ref 30.0–36.0)
MCV: 86.6 fL (ref 80.0–100.0)
Platelets: 263 10*3/uL (ref 150–400)
RBC: 4.34 MIL/uL (ref 3.87–5.11)
RDW: 15.2 % (ref 11.5–15.5)
WBC: 7.1 10*3/uL (ref 4.0–10.5)
nRBC: 0 % (ref 0.0–0.2)

## 2023-03-21 LAB — COMPREHENSIVE METABOLIC PANEL
ALT: 10 U/L (ref 0–44)
AST: 15 U/L (ref 15–41)
Albumin: 4 g/dL (ref 3.5–5.0)
Alkaline Phosphatase: 64 U/L (ref 38–126)
Anion gap: 9 (ref 5–15)
BUN: 15 mg/dL (ref 6–20)
CO2: 21 mmol/L — ABNORMAL LOW (ref 22–32)
Calcium: 9 mg/dL (ref 8.9–10.3)
Chloride: 109 mmol/L (ref 98–111)
Creatinine, Ser: 1.04 mg/dL — ABNORMAL HIGH (ref 0.44–1.00)
GFR, Estimated: >60 mL/min (ref >60)
Glucose, Bld: 117 mg/dL — ABNORMAL HIGH (ref 70–99)
Potassium: 4.2 mmol/L (ref 3.5–5.1)
Sodium: 139 mmol/L (ref 135–145)
Total Bilirubin: 0.3 mg/dL (ref 0.3–1.2)
Total Protein: 6.9 g/dL (ref 6.5–8.1)

## 2023-03-21 LAB — LIPASE, BLOOD: Lipase: 30 U/L (ref 11–51)

## 2023-03-21 MED ORDER — OMEPRAZOLE MAGNESIUM 20 MG PO TBEC: 20.0000 mg | DELAYED_RELEASE_TABLET | Freq: Every day | ORAL | 0 refills | Status: DC

## 2023-03-21 MED ORDER — FAMOTIDINE 20 MG PO TABS: 20.0000 mg | ORAL_TABLET | Freq: Two times a day (BID) | ORAL | 0 refills | Status: AC

## 2023-03-21 MED ORDER — IOHEXOL 300 MG/ML  SOLN
100.0000 mL | Freq: Once | INTRAMUSCULAR | Status: AC | PRN
  Administered 2023-03-21: 100 mL via INTRAVENOUS

## 2023-03-21 MED ORDER — ALUM & MAG HYDROXIDE-SIMETH 200-200-20 MG/5ML PO SUSP
30.0000 mL | Freq: Once | ORAL | Status: AC
  Administered 2023-03-21: 30 mL via ORAL
  Filled 2023-03-21: qty 30

## 2023-03-21 NOTE — Discharge Instructions (Signed)
Your blood work and CT imaging was reassuring today.  It is possible that you have gastritis or peptic ulcer disease.  Please take the medications as prescribed.  Please follow-up with gastroenterology we discussed the possibility of an EGD.  Please return for any new, worsening, or change in symptoms or other concerns.  Was a pleasure caring for you today.

## 2023-03-21 NOTE — ED Triage Notes (Signed)
Pt reports that she started having med abd pain last pm that is sharp, denies radiation, states that she is having some nausea and loose stools

## 2023-03-21 NOTE — ED Notes (Signed)
See triage note  Presents with mid abd pain  States pain is sharp since last pm Slight nausea  Also having a metallic taste Afebrile on arrival

## 2023-03-21 NOTE — ED Provider Notes (Signed)
[facility] Provider Note    Event Date/Time   First MD Initiated Contact with Patient 03/21/23 [phone]     (approximate)   History   Abdominal Pain   HPI  Carolyn Gregory is a 62 y.o. female with a past medical history of anxiety, depression, hypertension who presents today for evaluation of central abdominal pain that began last night.  She reports that it is sharp.  She is nauseated and has had loose stools.  She reports prior surgical history of hysterectomy and cholecystectomy.  Patient reports that she recently has been undergoing a great deal of stress which she realizes was the time of her symptom onset.  She reports that her pain is epigastric and burning in nature.  She reports that she has been eating a lot of chocolate to cope with her stress.  She feels early satiety, and burning in her chest when she lays flat.  No chest pain or shortness of breath currently.  No fevers or chills.  She reports that she has a known hiatal hernia.  Patient Active Problem List   Diagnosis Date Noted   Tear of medial cartilage or meniscus of knee, current 03/11/2020          Physical Exam   Triage Vital Signs: ED Triage Vitals  Encounter Vitals Group     BP 03/21/23 0826 (!) 158/91     Systolic BP Percentile --      Diastolic BP Percentile --      Pulse Rate 03/21/23 0826 (!) 59     Resp 03/21/23 0826 18     Temp 03/21/23 0826 98.1 F (36.7 C)     Temp Source 03/21/23 0826 Oral     SpO2 03/21/23 0826 96 %     Weight 03/21/23 0827 255 lb (115.7 kg)     Height 03/21/23 0827 5\' 7"  (1.702 m)     Head Circumference --      Peak Flow --      Pain Score 03/21/23 0827 8     Pain Loc --      Pain Education --      Exclude from Growth Chart --     Most recent vital signs: Vitals:   03/21/23 0826 03/21/23 1222  BP: (!) 158/91 (!) 148/86  Pulse: (!) 59 (!) 58  Resp: 18 18  Temp: 98.1 F (36.7 C)   SpO2: 96% 100%    Physical Exam Vitals and nursing  note reviewed.  Constitutional:      General: Awake and alert. No acute distress.    Appearance: Normal appearance. The patient is obese.  HENT:     Head: Normocephalic and atraumatic.     Mouth: Mucous membranes are moist.  Eyes:     General: PERRL. Normal EOMs        Right eye: No discharge.        Left eye: No discharge.     Conjunctiva/sclera: Conjunctivae normal.  Cardiovascular:     Rate and Rhythm: Normal rate and regular rhythm.     Pulses: Normal pulses.  Pulmonary:     Effort: Pulmonary effort is normal. No respiratory distress.     Breath sounds: Normal breath sounds.  Abdominal:     Abdomen is soft. There is no abdominal tenderness. No rebound or guarding. No distention. Musculoskeletal:        General: No swelling. Normal range of motion.     Cervical back: Normal range of motion  and neck supple.  Skin:    General: Skin is warm and dry.     Capillary Refill: Capillary refill takes less than 2 seconds.     Findings: No rash.  Neurological:     Mental Status: The patient is awake and alert.      ED Results / Procedures / Treatments   Labs (all labs ordered are listed, but only abnormal results are displayed) Labs Reviewed  COMPREHENSIVE METABOLIC PANEL - Abnormal; Notable for the following components:      Result Value   CO2 21 (*)    Glucose, Bld 117 (*)    Creatinine, Ser 1.04 (*)    All other components within normal limits  URINALYSIS, ROUTINE W REFLEX MICROSCOPIC - Abnormal; Notable for the following components:   Color, Urine STRAW (*)    APPearance CLEAR (*)    Specific Gravity, Urine 1.004 (*)    All other components within normal limits  LIPASE, BLOOD  CBC     EKG     RADIOLOGY I independently reviewed and interpreted imaging and agree with radiologists findings.     PROCEDURES:  Critical Care performed:   Procedures   MEDICATIONS ORDERED IN ED: Medications  alum & mag hydroxide-simeth (MAALOX/MYLANTA) 200-200-20 MG/5ML  suspension 30 mL (30 mLs Oral Given 03/21/23 1042)  iohexol (OMNIPAQUE) 300 MG/ML solution 100 mL (100 mLs Intravenous Contrast Given 03/21/23 1103)     IMPRESSION / MDM / ASSESSMENT AND PLAN / ED COURSE  I reviewed the triage vital signs and the nursing notes.   Differential diagnosis includes, but is not limited to, gastritis, peptic ulcer disease, pancreatitis, colitis, diverticulitis.  Patient is awake and alert, hemodynamically stable and afebrile.  Her abdomen is soft throughout, she endorses epigastric tenderness, there is no rebound or guarding.  Labs obtained in triage are overall reassuring, normal LFTs and lipase, no leukocytosis.  Given her new pain, CT abdomen and pelvis was obtained which does not reveal any acute intra-abdominal abnormalities.  Given her recent increase in stress, the burning nature of her symptoms, suspect gastritis versus peptic ulcer disease is most likely etiology.  She was given a GI cocktail with good effect.  She was started on PPI and H2 blocker.  She was reminded to take the omeprazole 30 minutes before eating breakfast in the morning.  She was instructed to follow-up with gastroenterology if symptoms persist for EGD.  We discussed return precautions and the importance of close outpatient follow-up.  We also discussed dietary modifications for symptomatic improvement as well.  Patient understands and agrees with plan.  She was discharged in stable condition.   Patient's presentation is most consistent with acute complicated illness / injury requiring diagnostic workup.    FINAL CLINICAL IMPRESSION(S) / ED DIAGNOSES   Final diagnoses:  Epigastric pain     Rx / DC Orders   ED Discharge Orders          Ordered    omeprazole (PRILOSEC OTC) 20 MG tablet  Daily        03/21/23 1217    famotidine (PEPCID) 20 MG tablet  2 times daily        03/21/23 1217             Note:  This document was prepared using Dragon voice recognition software and  may include unintentional dictation errors.   Keturah Shavers 03/21/23 1408    Minna Antis, MD 03/21/23 [phone]

## 2023-04-02 ENCOUNTER — Telehealth: Payer: Self-pay

## 2023-04-02 NOTE — Telephone Encounter (Signed)
Incoming referral from [place]. Pt is established at [facility] gastro. I fax information back to [phone] / Burnell Blanks to make her aware that pt was established at [facility]

## 2023-04-02 NOTE — Telephone Encounter (Signed)
Spoke with [place] from [place]. Per Grover Canavan she will let the RN know and send the referral back to [facility].

## 2023-04-17 NOTE — Progress Notes (Signed)
NEUROLOGY FOLLOW UP OFFICE NOTE  MIYUKI RZASA [id number]  Assessment/Plan:   Migraine without aura, without status migrainosus, not intractable Cervicalgia Bilateral carpal tunnel syndrome   Change from Flexeril to tizanidine 2mg  - take at bedtime but may take up to 2 times during the day if needed (caution for drowsiness) Continue physical therapy for neck pain Refer to hand specialist for CTS Follow up 5 months.  Total time spent in chart and face to face with patient:  35 minutes     Subjective:  Lynita Lombard. Grosshans is a 62 year old right-handed female with back pain, HTN and depression/anxiety who follows up for headache and neck pain.   UPDATE: Increased flexeril.  It helped her sleep but with variability treating the neck spasms.   [facility] even though we [facility].  She contacted patient advocate and finally just started last week.  She reports she recently strained her neck on the right side, which is what they addressed at PT.    She has not yet seen the hand specialist for the CTS.  She has been using the wrist splints but it seems to be getting worse.    Overall, migraines occurring twice a week.     Current NSAIDS/analgesics:  none Current triptans:  none Current ergotamine:  none Current anti-emetic:  none Current muscle relaxants:  Flexeril 10mg  at bedtime PRN Current Antihypertensive medications:  metoprolol,,lisinopril Current Antidepressant medications:  Lexapro Current Anticonvulsant medications:   Current anti-CGRP:  none Current Vitamins/Herbal/Supplements:  Magnesium oxide 400mg  TID Current Antihistamines/Decongestants:  Cetirizine  Other therapy:  none Hormone/birth control:  Estradiol   Caffeine:  No coffee.  Tea infrequently.  No soda Diet:  Keeps hydrated.  Skips meals. No soda Exercise:  walks Depression:  yes; Anxiety:  Yes, gotten worse. Other pain:  Chronic pain in back,  knee Sleep hygiene:  Trouble sleeping. Related to stress.    HISTORY:  She has had migraines since age 62.  They are typically a 4-5/10 non-throbbing pressure across the forehead with nausea, photophobia and phonophobia but they have changed around 2018 associated with elevated blood pressure and her chronic pain.  She developed new severe headaches in December 2021, a 10/10 pressure over the top of her head. It lasted for several hours.  Associated nausea, photophobia and phonophobia, black floaters in vision, dyspnea and increased sound of her tinnitus but no vomiting.  She couldn't rest.  They have been occurring 3 days a week since onset. She reports similar episode in 2019 related to stress.  Blood pressure has been elevated, reportedly 160s systolic.  MRI of brain with and without contrast and MRA of head on 07/22/2020 were normal.  Endorsed neck pain.  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  Has numbness in the hands.  NCV-EMG upper extremities on 08/30/2022 showed bilateral moderate CTS.   Past NSAIDS/analgesics:  Codeine (side effect), ibuprofen, ketorolac, Mobic, tramadol Past abortive triptans:  none Past abortive ergotamine:  none Past muscle relaxants:  Robaxin, Flexeril Past anti-emetic:  Zofran ODT 4mg  Past antihypertensive medications:  none Past antidepressant medications:  Sertraline, Wellbutrin Past anticonvulsant medications:  gabapentin (caused eye twitching), topiramate (upset her stomach) Past anti-CGRP:  none Past vitamins/Herbal/Supplements:  none Past antihistamines/decongestants:  none Other past therapies:  none     Family history of headache:  She is not aware  PAST MEDICAL HISTORY: Past  Medical History:  Diagnosis Date   Anxiety    Back pain    Depression    Hypertension     MEDICATIONS: Current Outpatient Medications on File Prior to Visit  Medication Sig Dispense Refill    aluminum chloride (DRYSOL) 20 % external solution APPLY SMALL AMOUNT TOPICALLY AS DIRECTED APPLY TO CLEAN UNDERARMS AT NIGHT AND WASH OFF IN THE MORNING. USE 2  NIGHTS A WEEK AND IF TOLERATING CAN INCREASE TO EVERY OTHER NIGHT. APPLY TO CLEAN UNDERARMS AT NIGHT AND WASH OFF IN THE MORNING. USE 2   NIGHTS A WEEK AND IF TOLERATING CAN INCREASE TO EVERY OTHER NIGHT.     Benzoyl Peroxide 5 % lotion APPLY SMALL AMOUNT TOPICALLY AS DIRECTED APPLY TO GROIN AREA EVERY OTHER DAY TO DAILY     cholecalciferol (VITAMIN D) 400 units TABS tablet Take 1,000 Units by mouth.     cyclobenzaprine (FLEXERIL) 10 MG tablet Take 1 tablet (10 mg total) by mouth 3 (three) times daily as needed for muscle spasms. 90 tablet 5   Dimethicone-Petrolatum 1-30 % CREA APPLY MODERATE AMOUNT TOPICALLY EVERY DAY USE AS NEEDED ON LOWER CHEST TO CONTROL RASH     estradiol (ESTRACE) 0.1 MG/GM vaginal cream APPLY ONE GRAM INTO TOPICALLY AS DIRECTED TUESDAYS AND THURSDAYS EXTERNALLY AT BEDTIME  ONLY TO AREAS OF CONCERN     famotidine (PEPCID) 20 MG tablet Take 1 tablet (20 mg total) by mouth 2 (two) times daily. 20 tablet 0   ketoconazole (NIZORAL) 2 % shampoo      lisinopril (ZESTRIL) 40 MG tablet Take 40 mg by mouth.     naproxen (NAPROSYN) 500 MG tablet Take 1 tablet (500 mg total) by mouth 2 (two) times daily with a meal. 20 tablet 2   omeprazole (PRILOSEC OTC) 20 MG tablet Take 1 tablet (20 mg total) by mouth daily. 30 tablet 0   polyvinyl alcohol (LIQUIFILM TEARS) 1.4 % ophthalmic solution INSTILL ONE DROP IN [facility] EYE TWO TIMES A DAY FOR DRY EYES     No current facility-administered medications on file prior to visit.    ALLERGIES: Allergies  Allergen Reactions   Codeine Other (See Comments)   Hctz [Hydrochlorothiazide] Other (See Comments)    Causes her potassium to drop   Trimebutine    Sertraline Anxiety    FAMILY HISTORY: Family History  Problem Relation Age of Onset   Diabetes Mother       Objective:  Blood  pressure 136/84, pulse (!) 59, height 5\' 10"  (1.778 m), weight 246 lb (111.6 kg), SpO2 99%. General: No acute distress.  Patient appears well-groomed.   Head:  Normocephalic/atraumatic Eyes:  Fundi examined but not visualized Neck: supple,bilateral paraspinal tenderness, reduced range of motion bilateral neck turn, side bending Heart:  Regular rate and rhythm Neurological Exam: alert and oriented.  Speech fluent and not dysarthric, language intact.  CN II-XII intact. Bulk and tone normal, muscle strength 5/5 throughout.  Sensation to light touch intact.  Deep tendon reflexes 2+ throughout.  Finger to nose testing intact.  Gait normal, Romberg negative.   Shon Millet, DO  CC: Christena Deem, PA-C

## 2023-04-19 ENCOUNTER — Ambulatory Visit (INDEPENDENT_AMBULATORY_CARE_PROVIDER_SITE_OTHER): Payer: No Typology Code available for payment source | Admitting: Neurology

## 2023-04-19 ENCOUNTER — Encounter: Payer: Self-pay | Admitting: Neurology

## 2023-04-19 VITALS — BP 136/84 | HR 59 | Ht 70.0 in | Wt 246.0 lb

## 2023-04-19 DIAGNOSIS — G5603 Carpal tunnel syndrome, bilateral upper limbs: Secondary | ICD-10-CM

## 2023-04-19 MED ORDER — TIZANIDINE HCL 2 MG PO TABS: 2.0000 mg | ORAL_TABLET | Freq: Three times a day (TID) | ORAL | 5 refills | Status: DC | PRN

## 2023-04-19 NOTE — Patient Instructions (Addendum)
Start tizanidine 2mg  three times daily as needed.  Take the bedtime dose every night and you may take 1 tablet twice daily as needed during the day.  Caution for drowsiness.  If you feel it is ineffective, contact me and we can increase dose Continue physical therapy I want you to see a hand specialist for the carpal tunnel, [place]  [address] [place] [address]  Phone: [phone] Follow up 5 months.

## 2023-05-27 ENCOUNTER — Ambulatory Visit: Payer: No Typology Code available for payment source | Admitting: Neurology

## 2023-06-21 ENCOUNTER — Telehealth: Payer: Self-pay | Admitting: Neurology

## 2023-06-21 NOTE — Telephone Encounter (Signed)
Patient is returning a call to someone. She said she needs to be seen sooner than march. She is having new issues ( HA and excruciating neck pain ) no one left a message due to her VM being full

## 2023-08-03 ENCOUNTER — Emergency Department
Admission: EM | Admit: 2023-08-03 | Discharge: 2023-08-03 | Disposition: A | Discharge status: Home / Self Care | Payer: No Typology Code available for payment source | Attending: Emergency Medicine | Admitting: Emergency Medicine

## 2023-08-03 ENCOUNTER — Emergency Department: Payer: No Typology Code available for payment source

## 2023-08-03 ENCOUNTER — Other Ambulatory Visit: Payer: Self-pay

## 2023-08-03 DIAGNOSIS — I10 Essential (primary) hypertension: Secondary | ICD-10-CM | POA: Insufficient documentation

## 2023-08-03 DIAGNOSIS — Z20822 Contact with and (suspected) exposure to covid-19: Secondary | ICD-10-CM | POA: Diagnosis not present

## 2023-08-03 DIAGNOSIS — R051 Acute cough: Secondary | ICD-10-CM

## 2023-08-03 DIAGNOSIS — J069 Acute upper respiratory infection, unspecified: Secondary | ICD-10-CM | POA: Insufficient documentation

## 2023-08-03 DIAGNOSIS — R079 Chest pain, unspecified: Secondary | ICD-10-CM | POA: Insufficient documentation

## 2023-08-03 DIAGNOSIS — R059 Cough, unspecified: Secondary | ICD-10-CM | POA: Diagnosis present

## 2023-08-03 LAB — BASIC METABOLIC PANEL
Anion gap: 8 (ref 5–15)
BUN: 13 mg/dL (ref 6–20)
CO2: 24 mmol/L (ref 22–32)
Calcium: 9 mg/dL (ref 8.9–10.3)
Chloride: 111 mmol/L (ref 98–111)
Creatinine, Ser: 1.14 mg/dL — ABNORMAL HIGH (ref 0.44–1.00)
GFR, Estimated: 55 mL/min — ABNORMAL LOW (ref >60)
Glucose, Bld: 91 mg/dL (ref 70–99)
Potassium: 3.6 mmol/L (ref 3.5–5.1)
Sodium: 143 mmol/L (ref 135–145)

## 2023-08-03 LAB — CBC WITH DIFFERENTIAL/PLATELET
Abs Immature Granulocytes: 0.02 10*3/uL (ref 0.00–0.07)
Basophils Absolute: 0 10*3/uL (ref 0.0–0.1)
Basophils Relative: 1 %
Eosinophils Absolute: 0.2 10*3/uL (ref 0.0–0.5)
Eosinophils Relative: 3 %
HCT: 37.2 % (ref 36.0–46.0)
Hemoglobin: 12.1 g/dL (ref 12.0–15.0)
Immature Granulocytes: 0 %
Lymphocytes Relative: 13 %
Lymphs Abs: 1.1 10*3/uL (ref 0.7–4.0)
MCH: 28.3 pg (ref 26.0–34.0)
MCHC: 32.5 g/dL (ref 30.0–36.0)
MCV: 87.1 fL (ref 80.0–100.0)
Monocytes Absolute: 0.6 10*3/uL (ref 0.1–1.0)
Monocytes Relative: 7 %
Neutro Abs: 6.7 10*3/uL (ref 1.7–7.7)
Neutrophils Relative %: 76 %
Platelets: 228 10*3/uL (ref 150–400)
RBC: 4.27 MIL/uL (ref 3.87–5.11)
RDW: 15.2 % (ref 11.5–15.5)
WBC: 8.8 10*3/uL (ref 4.0–10.5)
nRBC: 0 % (ref 0.0–0.2)

## 2023-08-03 LAB — RESP PANEL BY RT-PCR (RSV, FLU A&B, COVID)  RVPGX2
Influenza A by PCR: NEGATIVE
Influenza B by PCR: NEGATIVE
Resp Syncytial Virus by PCR: NEGATIVE
SARS Coronavirus 2 by RT PCR: NEGATIVE

## 2023-08-03 LAB — TROPONIN I (HIGH SENSITIVITY): Troponin I (High Sensitivity): 5 ng/L (ref <18)

## 2023-08-03 NOTE — Discharge Instructions (Signed)
Please return if you have persistent or worsening symptoms, nausea, vomiting, lightheaded, if you pass out, if you have chest pain, shortness of breath, or if you have any additional concerns

## 2023-08-03 NOTE — ED Provider Triage Note (Signed)
Emergency Medicine Provider Triage Evaluation Note  Carolyn Gregory , a 63 y.o. female  was evaluated in triage.  Pt complains of cough, sore throat, chest pain since yesterday. Reports coughing up a lot of phlegm.  Review of Systems  Positive: Cough, sore throat, chest pain Negative: fever  Physical Exam  BP (!) 148/87 (BP Location: Left Arm)   Pulse 69   Temp 98.6 F (37 C) (Oral)   Resp 18   SpO2 99%  Gen:   Awake, no distress   Resp:  Normal effort  MSK:   Moves extremities without difficulty  Other:    Medical Decision Making  Medically screening exam initiated at 2:24 PM.  Appropriate orders placed.  Carolyn Gregory was informed that the remainder of the evaluation will be completed by another provider, this initial triage assessment does not replace that evaluation, and the importance of remaining in the ED until their evaluation is complete.     Jackelyn Hoehn, PA-C 08/03/23 1425

## 2023-08-03 NOTE — ED Provider Notes (Signed)
[facility] Provider Note    Event Date/Time   First MD Initiated Contact with Patient 08/03/23 1745     (approximate)   History   Chest Pain   HPI  Carolyn Gregory is a 63 y.o. female history of hypertension, GERD, presenting with chest pressure since yesterday, has some sore throat cough and postnasal drip for couple days.  She denies any fever, no abdominal pain, no nausea, vomiting, diarrhea.   Independent review of chart, she has history of back pain, hypertension, depression and anxiety, she is seen by neurology in October of last year and is getting Flexeril for her neck spasms.  She denies any pain in her neck at this point.  Physical Exam   Triage Vital Signs: ED Triage Vitals  Encounter Vitals Group     BP 08/03/23 1418 (!) 148/87     Systolic BP Percentile --      Diastolic BP Percentile --      Pulse Rate 08/03/23 1418 69     Resp 08/03/23 1418 18     Temp 08/03/23 1418 98.6 F (37 C)     Temp Source 08/03/23 1418 Oral     SpO2 08/03/23 1418 99 %     Weight 08/03/23 1427 251 lb (113.9 kg)     Height 08/03/23 1427 5\' 7"  (1.702 m)     Head Circumference --      Peak Flow --      Pain Score 08/03/23 1426 5     Pain Loc --      Pain Education --      Exclude from Growth Chart --     Most recent vital signs: Vitals:   08/03/23 1425 08/03/23 1818  BP: (!) 145/87 (!) 163/95  Pulse: 77 69  Resp: 16 12  Temp: 98.9 F (37.2 C)   SpO2: 98% 98%     General: Awake, no distress.  CV:  Good peripheral perfusion.  Resp:  Normal effort.  Clear to auscultation bilaterally Abd:  No distention.  Soft nontender Other:  Clear oropharynx.   ED Results / Procedures / Treatments   Labs (all labs ordered are listed, but only abnormal results are displayed) Labs Reviewed  BASIC METABOLIC PANEL - Abnormal; Notable for the following components:      Result Value   Creatinine, Ser 1.14 (*)    GFR, Estimated 55 (*)    All other  components within normal limits  RESP PANEL BY RT-PCR (RSV, FLU A&B, COVID)  RVPGX2  CBC WITH DIFFERENTIAL/PLATELET  TROPONIN I (HIGH SENSITIVITY)  TROPONIN I (HIGH SENSITIVITY)     EKG  Sinus rhythm with sinus arrhythmia, rate 69, normal QRS, normal QTc, T wave inversion in 2 3, aVF, T wave flattening to V6, no ischemic ST elevation, T wave changes new compared to prior   RADIOLOGY Chest x-ray my interpretation without obvious consolidation   PROCEDURES:  Critical Care performed: No  Procedures   MEDICATIONS ORDERED IN ED: Medications - No data to display   IMPRESSION / MDM / ASSESSMENT AND PLAN / ED COURSE  I reviewed the triage vital signs and the nursing notes.                              Differential diagnosis includes, but is not limited to, ACS, viral illness, COVID, influenza, RSV, GERD.  Will get labs, EKG, troponin, chest x-ray, respiratory swab  Patient's presentation is most consistent with acute presentation with potential threat to life or bodily function.  Independent review of labs, respiratory viral panel is negative, no leukocytosis, H&H is stable, creatinine is mildly elevated but patient is able to take p.o. at home, electrolytes not severely deranged, troponin is negative.  Suspect this might be a viral URI.  Considered but no indication for further workup by admission at this time, she is safe outpatient management.  Shared decision making done with patient and she is agreeable plan for discharge.  Return cautions given.  Instructed follow-up with primary care doctor.      FINAL CLINICAL IMPRESSION(S) / ED DIAGNOSES   Final diagnoses:  Chest pain, unspecified type  Upper respiratory tract infection, unspecified type  Acute cough     Rx / DC Orders   ED Discharge Orders     None        Note:  This document was prepared using Dragon voice recognition software and may include unintentional dictation errors.    Claybon Jabs,  MD 08/03/23 [phone]

## 2023-08-03 NOTE — ED Triage Notes (Signed)
Pt reports chest discomfort from last night, reports today she was walking and noticed chest pressure and shortness of breath. Pt reports sore throat and post nasal drip since yesterday. Pt talks in complete sentences no respiratory distress noted

## 2023-09-30 NOTE — Progress Notes (Deleted)
 NEUROLOGY FOLLOW UP OFFICE NOTE  Carolyn Gregory [id number]  Assessment/Plan:   Migraine without aura, without status migrainosus, not intractable Cervicalgia Bilateral carpal tunnel syndrome   Tizanidine *** Continue physical therapy for neck pain Refer to hand specialist for CTS Follow up ***       Subjective:  Carolyn Gregory is a 63 year old right-handed female with back pain, HTN and depression/anxiety who follows up for headache and neck pain.   UPDATE: For myofascial neck pain, changed from Flexeril to tizanidine.  Continued PT.  ***  CTS specialist?  ***  Overall, migraines occurring twice a week.     Current NSAIDS/analgesics:  none Current triptans:  none Current ergotamine:  none Current anti-emetic:  none Current muscle relaxants:  Flexeril 10mg  at bedtime PRN Current Antihypertensive medications:  metoprolol,,lisinopril Current Antidepressant medications:  Lexapro Current Anticonvulsant medications:   Current anti-CGRP:  none Current Vitamins/Herbal/Supplements:  Magnesium oxide 400mg  TID Current Antihistamines/Decongestants:  Cetirizine  Other therapy:  none Hormone/birth control:  Estradiol   Caffeine:  No coffee.  Tea infrequently.  No soda Diet:  Keeps hydrated.  Skips meals. No soda Exercise:  walks Depression:  yes; Anxiety:  Yes, gotten worse. Other pain:  Chronic pain in back, knee Sleep hygiene:  Trouble sleeping. Related to stress.    HISTORY:  She has had migraines since age 61.  They are typically a 4-5/10 non-throbbing pressure across the forehead with nausea, photophobia and phonophobia but they have changed around 2018 associated with elevated blood pressure and her chronic pain.  She developed new severe headaches in December 2021, a 10/10 pressure over the top of her head. It lasted for several hours.  Associated nausea, photophobia and phonophobia, black floaters in vision, dyspnea and increased sound of her tinnitus but no  vomiting.  She couldn't rest.  They have been occurring 3 days a week since onset. She reports similar episode in 2019 related to stress.  Blood pressure has been elevated, reportedly 160s systolic.  MRI of brain with and without contrast and MRA of head on 07/22/2020 were normal.  Endorsed neck pain.  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  Has numbness in the hands.  NCV-EMG upper extremities on 08/30/2022 showed bilateral moderate CTS.   Past NSAIDS/analgesics:  Codeine (side effect), ibuprofen, ketorolac, Mobic, tramadol Past abortive triptans:  none Past abortive ergotamine:  none Past muscle relaxants:  Robaxin, Flexeril Past anti-emetic:  Zofran ODT 4mg  Past antihypertensive medications:  none Past antidepressant medications:  Sertraline, Wellbutrin Past anticonvulsant medications:  gabapentin (caused eye twitching), topiramate (upset her stomach) Past anti-CGRP:  none Past vitamins/Herbal/Supplements:  none Past antihistamines/decongestants:  none Other past therapies:  none     Family history of headache:  She is not aware  PAST MEDICAL HISTORY: Past Medical History:  Diagnosis Date   Anxiety    Back pain    Depression    Hypertension     MEDICATIONS: Current Outpatient Medications on File Prior to Visit  Medication Sig Dispense Refill   aluminum chloride (DRYSOL) 20 % external solution APPLY SMALL AMOUNT TOPICALLY AS DIRECTED APPLY TO CLEAN UNDERARMS AT NIGHT AND WASH OFF IN THE MORNING. USE 2  NIGHTS A WEEK AND IF TOLERATING CAN INCREASE TO EVERY OTHER NIGHT. APPLY TO CLEAN UNDERARMS AT NIGHT AND WASH OFF IN THE MORNING. USE 2   NIGHTS A WEEK AND IF TOLERATING CAN INCREASE TO EVERY OTHER NIGHT.  Benzoyl Peroxide 5 % lotion APPLY SMALL AMOUNT TOPICALLY AS DIRECTED APPLY TO GROIN AREA EVERY OTHER DAY TO DAILY     cholecalciferol (VITAMIN D) 400 units TABS tablet Take 1,000 Units by mouth.      estradiol (ESTRACE) 0.1 MG/GM vaginal cream APPLY ONE GRAM INTO TOPICALLY AS DIRECTED TUESDAYS AND THURSDAYS EXTERNALLY AT BEDTIME  ONLY TO AREAS OF CONCERN     famotidine (PEPCID) 20 MG tablet Take 1 tablet (20 mg total) by mouth 2 (two) times daily. 20 tablet 0   ketoconazole (NIZORAL) 2 % shampoo      lisinopril (ZESTRIL) 40 MG tablet Take 40 mg by mouth.     metoprolol succinate (TOPROL-XL) 25 MG 24 hr tablet Take 25 mg by mouth daily. TAKE ONE-HALF TABLET BY MOUTH EVERY DAY FOR HEART RATE CONTROL     polyvinyl alcohol (LIQUIFILM TEARS) 1.4 % ophthalmic solution INSTILL ONE DROP IN [facility] EYE TWO TIMES A DAY FOR DRY EYES     tiZANidine (ZANAFLEX) 2 MG tablet Take 1 tablet (2 mg total) by mouth 3 (three) times daily as needed for muscle spasms. 90 tablet 5   No current facility-administered medications on file prior to visit.    ALLERGIES: Allergies  Allergen Reactions   Codeine Other (See Comments)   Hctz [Hydrochlorothiazide] Other (See Comments)    Causes her potassium to drop   Trimebutine    Sertraline Anxiety    FAMILY HISTORY: Family History  Problem Relation Age of Onset   Diabetes Mother       Objective:  *** General: No acute distress.  Patient appears well-groomed.   ***   Carolyn Millet, DO  CC: Christena Deem, PA-C

## 2023-10-01 ENCOUNTER — Ambulatory Visit: Payer: No Typology Code available for payment source | Admitting: Neurology

## 2023-10-01 ENCOUNTER — Encounter: Payer: Self-pay | Admitting: Neurology

## 2023-10-04 ENCOUNTER — Ambulatory Visit (INDEPENDENT_AMBULATORY_CARE_PROVIDER_SITE_OTHER): Payer: Self-pay | Admitting: Neurology

## 2023-10-04 ENCOUNTER — Encounter: Payer: Self-pay | Admitting: Neurology

## 2023-10-04 VITALS — BP 117/71 | HR 63 | Ht 66.0 in | Wt 254.0 lb

## 2023-10-04 DIAGNOSIS — M7918 Myalgia, other site: Secondary | ICD-10-CM

## 2023-10-04 DIAGNOSIS — G43009 Migraine without aura, not intractable, without status migrainosus: Secondary | ICD-10-CM

## 2023-10-04 DIAGNOSIS — G5603 Carpal tunnel syndrome, bilateral upper limbs: Secondary | ICD-10-CM

## 2023-10-04 MED ORDER — AJOVY 225 MG/1.5ML ~~LOC~~ SOAJ: 225.0000 mg | SUBCUTANEOUS | 5 refills | Status: DC

## 2023-10-04 NOTE — Patient Instructions (Signed)
 Plan to start Ajovy injection every 30 days Tizanidine as needed for neck pain Discuss with your pain doctor about whether trigger point injections in the neck muscles and shoulders would be appropriate. Follow up with hand specialist regarding carpal tunnel Follow up 6 months.

## 2023-10-04 NOTE — Progress Notes (Signed)
 NEUROLOGY FOLLOW UP OFFICE NOTE  JASSLYN FINKEL [id number]  Assessment/Plan:   Migraine without aura, without status migrainosus, not intractable Cervicalgia/cervical myofascial pain Bilateral carpal tunnel syndrome   Migraine prevention:  Start Ajovy every 30 days.  She has hypertension on 2 antihypertensive medications, so I would like to avoid Aimovig For myofascial neck pain: Tizanidine as needed Massage therapy Discuss with your pain specialist about whether trigger point injections may be appropriate. Follow up with hand specialist regarding CTS Follow up with me in 6 months.       Subjective:  Lynita Lombard. Correira is a 63 year old right-handed female with back pain, HTN and depression/anxiety who follows up for headache and neck pain.   UPDATE: For myofascial neck pain, changed from Flexeril to tizanidine.  Finished PT.  Her physical therapist recommended a home traction device.  She has now started massage therapy.   Neck turning or tilting will aggravate pain which may trigger a migraine.  Migraines occur about 3 times a week.    She hasn't yet seen the hand specialist for carpal tunnel syndrome.  Paperwork has been approved but she needs approval to a specific specialist.     Current NSAIDS/analgesics:  none Current triptans:  none Current ergotamine:  none Current anti-emetic:  none Current muscle relaxants:  Flexeril 10mg  at bedtime PRN Current Antihypertensive medications:  metoprolol,lisinopril Current Antidepressant medications:  Lexapro Current Anticonvulsant medications:   Current anti-CGRP:  none Current Vitamins/Herbal/Supplements:  Magnesium oxide 400mg  TID Current Antihistamines/Decongestants:  Cetirizine  Other therapy:  none Hormone/birth control:  Estradiol   Caffeine:  No coffee.  Tea infrequently.  No soda Diet:  Keeps hydrated.  Skips meals. No soda Exercise:  walks Depression:  yes; Anxiety:  Yes, gotten worse. Other pain:  Chronic pain in  back, knee Sleep hygiene:  Trouble sleeping. Related to stress.    HISTORY:  She has had migraines since age 63.  They are typically a 4-5/10 non-throbbing pressure across the forehead with nausea, photophobia and phonophobia but they have changed around 2018 associated with elevated blood pressure and her chronic pain.  She developed new severe headaches in December 2021, a 10/10 pressure over the top of her head. It lasted for several hours.  Associated nausea, photophobia and phonophobia, black floaters in vision, dyspnea and increased sound of her tinnitus but no vomiting.  She couldn't rest.  They have been occurring 3 days a week since onset. She reports similar episode in 2019 related to stress.  Blood pressure has been elevated, reportedly 160s systolic.  MRI of brain with and without contrast and MRA of head on 07/22/2020 were normal.  Endorsed neck pain.  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  Has numbness in the hands.  NCV-EMG upper extremities on 08/30/2022 showed bilateral moderate CTS.   Past NSAIDS/analgesics:  Codeine (side effect), ibuprofen, ketorolac, Mobic, tramadol Past abortive triptans:  none Past abortive ergotamine:  none Past muscle relaxants:  Robaxin, Flexeril Past anti-emetic:  Zofran ODT 4mg  Past antihypertensive medications:  none Past antidepressant medications:  Sertraline, Wellbutrin Past anticonvulsant medications:  gabapentin (caused eye twitching), topiramate (upset her stomach) Past anti-CGRP:  none Past vitamins/Herbal/Supplements:  none Past antihistamines/decongestants:  none Other past therapies:  none     Family history of headache:  She is not aware  PAST MEDICAL HISTORY: Past Medical History:  Diagnosis Date   Anxiety    Back pain    Depression  Hypertension     MEDICATIONS: Current Outpatient Medications on File Prior to Visit  Medication Sig Dispense Refill    aluminum chloride (DRYSOL) 20 % external solution APPLY SMALL AMOUNT TOPICALLY AS DIRECTED APPLY TO CLEAN UNDERARMS AT NIGHT AND WASH OFF IN THE MORNING. USE 2  NIGHTS A WEEK AND IF TOLERATING CAN INCREASE TO EVERY OTHER NIGHT. APPLY TO CLEAN UNDERARMS AT NIGHT AND WASH OFF IN THE MORNING. USE 2   NIGHTS A WEEK AND IF TOLERATING CAN INCREASE TO EVERY OTHER NIGHT.     Benzoyl Peroxide 5 % lotion APPLY SMALL AMOUNT TOPICALLY AS DIRECTED APPLY TO GROIN AREA EVERY OTHER DAY TO DAILY     cholecalciferol (VITAMIN D) 400 units TABS tablet Take 1,000 Units by mouth.     estradiol (ESTRACE) 0.1 MG/GM vaginal cream APPLY ONE GRAM INTO TOPICALLY AS DIRECTED TUESDAYS AND THURSDAYS EXTERNALLY AT BEDTIME  ONLY TO AREAS OF CONCERN     famotidine (PEPCID) 20 MG tablet Take 1 tablet (20 mg total) by mouth 2 (two) times daily. 20 tablet 0   ketoconazole (NIZORAL) 2 % shampoo      lisinopril (ZESTRIL) 40 MG tablet Take 40 mg by mouth.     metoprolol succinate (TOPROL-XL) 25 MG 24 hr tablet Take 25 mg by mouth daily. TAKE ONE-HALF TABLET BY MOUTH EVERY DAY FOR HEART RATE CONTROL     tiZANidine (ZANAFLEX) 2 MG tablet Take 1 tablet (2 mg total) by mouth 3 (three) times daily as needed for muscle spasms. 90 tablet 5   No current facility-administered medications on file prior to visit.    ALLERGIES: Allergies  Allergen Reactions   Codeine Other (See Comments)   Hctz [Hydrochlorothiazide] Other (See Comments)    Causes her potassium to drop   Trimebutine    Sertraline Anxiety    FAMILY HISTORY: Family History  Problem Relation Age of Onset   Diabetes Mother       Objective:  Blood pressure 117/71, pulse 63, height 5\' 6"  (1.676 m), weight 254 lb (115.2 kg), SpO2 100%. General: No acute distress.  Patient appears well-groomed.      Shon Millet, DO  CC: Christena Deem, PA-C

## 2023-10-08 ENCOUNTER — Telehealth: Payer: Self-pay | Admitting: Neurology

## 2023-10-08 ENCOUNTER — Other Ambulatory Visit (HOSPITAL_COMMUNITY): Payer: Self-pay

## 2023-10-08 NOTE — Telephone Encounter (Signed)
Chart notes have been faxed to the number provided.

## 2023-10-08 NOTE — Telephone Encounter (Signed)
 Denir calling [employer] about Ajovy, non formulary and the formulary team needs progress notes faxed to [phone]

## 2023-10-22 ENCOUNTER — Telehealth: Payer: Self-pay | Admitting: Neurology

## 2023-10-22 NOTE — Telephone Encounter (Signed)
 Pt called in wanting to follow up on the paper she brought in a little bit ago about neck stretching.

## 2023-10-22 NOTE — Telephone Encounter (Signed)
 Advised patient spoke to rep form received waiting on Dr.Jaffe.

## 2023-11-08 NOTE — Telephone Encounter (Signed)
 Letter received from [place], Patient has not had an adequate trail of formulary preferred alternatives including Aimovig.     Would patient be a candidate for the formulary medication Aimovig?

## 2023-11-11 ENCOUNTER — Other Ambulatory Visit (HOSPITAL_COMMUNITY): Payer: Self-pay

## 2023-11-11 NOTE — Telephone Encounter (Signed)
 Have you received the prior authorization form for Aimovig? The typically send a form. I only sent chart notes for the Ajovy  request.

## 2023-11-12 ENCOUNTER — Other Ambulatory Visit: Payer: Self-pay | Admitting: Neurology

## 2023-11-12 NOTE — Telephone Encounter (Signed)
 PA is not needed. Send in new script for Aimovig, since it is the formulary preferred, no PA needed.

## 2023-11-14 MED ORDER — AIMOVIG 140 MG/ML ~~LOC~~ SOAJ: 140.0000 mg | SUBCUTANEOUS | 11 refills | Status: DC

## 2023-11-14 NOTE — Addendum Note (Signed)
 Addended by: Michalene Agee on: 11/14/2023 09:11 AM   Modules accepted: Orders

## 2023-11-25 ENCOUNTER — Telehealth: Payer: Self-pay

## 2023-11-25 ENCOUNTER — Other Ambulatory Visit: Payer: Self-pay | Admitting: Neurology

## 2023-11-25 ENCOUNTER — Telehealth: Payer: Self-pay | Admitting: Neurology

## 2023-11-25 MED ORDER — AJOVY 225 MG/1.5ML ~~LOC~~ SOAJ: 225.0000 mg | SUBCUTANEOUS | 5 refills | Status: DC

## 2023-11-25 NOTE — Telephone Encounter (Signed)
 Carolyn Gregory with [facility] pharmacy Ajovy  is approved as of 10/23/23 to current.    May send a new script.

## 2023-11-25 NOTE — Telephone Encounter (Signed)
 Ajovy  has been sent to [facility] calling patient, VM full. Unable to lvm.   Mychart message sent.

## 2023-11-25 NOTE — Telephone Encounter (Signed)
 Pt. Would like to know update on Neck Stretching device

## 2023-12-24 ENCOUNTER — Ambulatory Visit: Admitting: Neurology

## 2024-04-10 NOTE — Progress Notes (Deleted)
 NEUROLOGY FOLLOW UP OFFICE NOTE  Carolyn Gregory [id number]  Assessment/Plan:   Migraine without aura, without status migrainosus, not intractable Cervicalgia/cervical myofascial pain Bilateral carpal tunnel syndrome   Migraine prevention:  Start Ajovy  every 30 days.  She has hypertension on 2 antihypertensive medications, so I would like to avoid Aimovig  For myofascial neck pain: Tizanidine  as needed Massage therapy Discuss with your pain specialist about whether trigger point injections may be appropriate. Follow up with hand specialist regarding CTS Follow up with me in 6 months.       Subjective:  Carolyn Gregory is a 63 year old right-handed female with back pain, HTN and depression/anxiety who follows up for headache and neck pain.   UPDATE: Migraine: Started Ajovy . ***  Myofascial cervical pain: Taking tizanidine .  ***  Carpal tunnel syndrome: She hasn't yet seen the hand specialist for carpal tunnel syndrome.  Paperwork has been approved but she needs approval to a specific specialist.     Current NSAIDS/analgesics:  none Current triptans:  none Current ergotamine:  none Current anti-emetic:  none Current muscle relaxants:  tizanidine  2mg  TID PRN Current Antihypertensive medications:  metoprolol,lisinopril Current Antidepressant medications:  Lexapro Current Anticonvulsant medications:   Current anti-CGRP:  none Current Vitamins/Herbal/Supplements:  Magnesium  oxide 400mg  TID Current Antihistamines/Decongestants:  Cetirizine  Other therapy:  massage therapy (neck pain) Hormone/birth control:  Estradiol   Caffeine:  No coffee.  Tea infrequently.  No soda Diet:  Keeps hydrated.  Skips meals. No soda Exercise:  walks Depression:  yes; Anxiety:  Yes, gotten worse. Other pain:  Chronic pain in back, knee Sleep hygiene:  Trouble sleeping. Related to stress.    HISTORY:  Migraines: She has had migraines since age 72.  They are typically a 4-5/10  non-throbbing pressure across the forehead with nausea, photophobia and phonophobia but they have changed around 2018 associated with elevated blood pressure and her chronic pain.  She developed new severe headaches in December 2021, a 10/10 pressure over the top of her head. It lasted for several hours.  Associated nausea, photophobia and phonophobia, black floaters in vision, dyspnea and increased sound of her tinnitus but no vomiting.  She couldn't rest.  They have been occurring 3 days a week since onset. She reports similar episode in 2019 related to stress.  Blood pressure has been elevated, reportedly 160s systolic.  MRI of brain with and without contrast and MRA of head on 07/22/2020 were normal.    Neck pain: Endorsed neck pain.  Neck turning or tilting will aggravate pain which may trigger a migraine.  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  Past treatments:  PT, Flexeril .  Carpal Tunnel Syndrome: Has numbness in the hands.  NCV-EMG upper extremities on 08/30/2022 showed bilateral moderate CTS.   Past NSAIDS/analgesics:  Codeine (side effect), ibuprofen , ketorolac , Mobic , tramadol  Past abortive triptans:  none Past abortive ergotamine:  none Past muscle relaxants:  Robaxin , Flexeril  Past anti-emetic:  Zofran  ODT 4mg  Past antihypertensive medications:  none Past antidepressant medications:  Sertraline, Wellbutrin Past anticonvulsant medications:  gabapentin (caused eye twitching), topiramate  (upset her stomach) Past anti-CGRP:  none Past vitamins/Herbal/Supplements:  none Past antihistamines/decongestants:  none Other past therapies:  PT (myofascial neck pain)     Family history of headache:  She is not aware  PAST MEDICAL HISTORY: Past Medical History:  Diagnosis Date   Anxiety    Back pain    Depression    Hypertension  MEDICATIONS: Current Outpatient Medications on File Prior to Visit  Medication Sig Dispense  Refill   aluminum chloride (DRYSOL) 20 % external solution APPLY SMALL AMOUNT TOPICALLY AS DIRECTED APPLY TO CLEAN UNDERARMS AT NIGHT AND WASH OFF IN THE MORNING. USE 2  NIGHTS A WEEK AND IF TOLERATING CAN INCREASE TO EVERY OTHER NIGHT. APPLY TO CLEAN UNDERARMS AT NIGHT AND WASH OFF IN THE MORNING. USE 2   NIGHTS A WEEK AND IF TOLERATING CAN INCREASE TO EVERY OTHER NIGHT.     Benzoyl Peroxide 5 % lotion APPLY SMALL AMOUNT TOPICALLY AS DIRECTED APPLY TO GROIN AREA EVERY OTHER DAY TO DAILY     Cholecalciferol (VITAMIN D-1000 MAX ST) 25 MCG (1000 UT) tablet Take 2 tablets by mouth daily.     estradiol (ESTRACE) 0.1 MG/GM vaginal cream APPLY ONE GRAM INTO TOPICALLY AS DIRECTED TUESDAYS AND THURSDAYS EXTERNALLY AT BEDTIME  ONLY TO AREAS OF CONCERN     famotidine  (PEPCID ) 20 MG tablet Take 1 tablet (20 mg total) by mouth 2 (two) times daily. 20 tablet 0   Fremanezumab -vfrm (AJOVY ) 225 MG/1.5ML SOAJ Inject 225 mg into the skin every 30 (thirty) days. 1.68 mL 5   ketoconazole (NIZORAL) 2 % shampoo      lisinopril (ZESTRIL) 40 MG tablet Take 40 mg by mouth.     metoprolol succinate (TOPROL-XL) 25 MG 24 hr tablet Take 25 mg by mouth daily. TAKE HALF TABLET BY MOUTH EVERY DAY FOR HEART RATE CONTROL     tiZANidine  (ZANAFLEX ) 2 MG tablet Take 1 tablet (2 mg total) by mouth 3 (three) times daily as needed for muscle spasms. 90 tablet 5   No current facility-administered medications on file prior to visit.    ALLERGIES: Allergies  Allergen Reactions   Codeine Other (See Comments)   Hctz [Hydrochlorothiazide] Other (See Comments)    Causes her potassium to drop   Trimebutine    Sertraline Anxiety    FAMILY HISTORY: Family History  Problem Relation Age of Onset   Diabetes Mother       Objective:  *** General: No acute distress.  Patient appears well-groomed.   Head:  Normocephalic/atraumatic Neck:  Supple.  No paraspinal tenderness.  Full range of motion. Heart:  Regular rate and rhythm. Neuro:   Alert and oriented.  Speech fluent and not dysarthric.  Language intact.  CN II-XII intact.  Bulk and tone normal.  Muscle strength 5/5 throughout.  Sensation to light touch intact.  Deep tendon reflexes 2+ throughout, toes downgoing.  Gait normal.  Romberg negative.    Juliene Dunnings, DO  CC: Harlene Bride, PA-C

## 2024-04-13 ENCOUNTER — Ambulatory Visit: Admitting: Neurology

## 2024-04-23 ENCOUNTER — Telehealth: Payer: Self-pay | Admitting: Neurology

## 2024-04-23 NOTE — Telephone Encounter (Signed)
 Who's calling (name and relationship to patient) : Koby Hartfield; self   Best contact number: [phone]  Provider they see: Dr. Skeet   Reason for call: Called to let provider know that [facility] will be [facility]ing over authorization to have expedited before next appt.

## 2024-04-23 NOTE — Telephone Encounter (Signed)
 No fax received yet.

## 2024-04-28 NOTE — Progress Notes (Unsigned)
 NEUROLOGY FOLLOW UP OFFICE NOTE  Carolyn Gregory [id number]  Assessment/Plan:   New onset cervicogenic headache Cervicalgia Migraine without aura, without status migrainosus, not intractable Bilateral carpal tunnel syndrome - she does have carpal tunnel syndrome but more recent wrist and thumb pain may be tendonitis vs arthritis.   Migraine prevention:   Ajovy  every 4 weeks Limit use of pain relievers to no more than 9 days out of the month to prevent risk of rebound or medication-overuse headache. Lifestyle modification Regarding cervicogenic headache/cervicalgia GIven worsening neck pain with new suspected associated headache, will check MRI of cervical spine without contrast.  She has failed pharmacologic management and therapy. Increase tizanidine  2mg  to three times daily.  If ineffective, would increase to 4mg .  If causes excessive drowsiness during the day, would change to baclofen 5mg  three times daily. She would like a second opinion from another hand specialist.  Will refer her. Follow up with me in 7 months.   Total time spent in chart and face to face with patient in evaluation and discussing diagnoses and plan:   40 minutes.   Subjective:  Carolyn JONETTA. Gregory is a 63 year old right-handed female with back pain, HTN and depression/anxiety who follows up for headache and neck pain.   UPDATE: Migraine: Started Ajovy . Improved. No recent migraines.     Myofascial cervical pain: Taking tizanidine .  Receives massage therapy.  Neck pain is uncontrolled.  New headache: 3 weeks ago, she was receiving massage therapy.  Palpation to the occipital region triggered a new headache that progressively got worse.  A severe debilitating shooting pain radiating up bilateral posterior neck (worse on the right) to the bilateral occipital region and diffuse coldness in her head.  She also has been having some dizziness and blurred vision.  She was in bed for several days and when  symptoms didn't improve, she went to the ED at the [facility] on 10/9.  She had blood work to test for inflammatory markers (suspect sed rate), which were reportedly normal.  She was given Tylenol  and discharged.  No radicular pain.    Carpal tunnel syndrome: Following last visit, she developed pain in wrist and left thumb.  She has seen a hand specialist and was told her symptoms were not carpal tunnel.  Continues to have numbness in the hands.  Was given exercises to strengthen the hand muscles.     Current NSAIDS/analgesics:  none Current triptans:  none Current ergotamine:  none Current anti-emetic:  none Current muscle relaxants:  tizanidine  2mg  TID PRN (has only been taking at bedtime) Current Antihypertensive medications:  metoprolol,lisinopril Current Antidepressant medications:  Lexapro Current Anticonvulsant medications:   Current anti-CGRP:  none Current Vitamins/Herbal/Supplements:  Magnesium  oxide 400mg  TID Current Antihistamines/Decongestants:  Cetirizine  Other therapy:  massage therapy (neck pain) Hormone/birth control:  Estradiol   Caffeine:  No coffee.  Tea infrequently.  No soda Diet:  Keeps hydrated.  Skips meals. No soda Exercise:  walks Depression:  yes; Anxiety:  Yes, gotten worse. Other pain:  Chronic pain in back, knee Sleep hygiene:  Trouble sleeping. Related to stress.    HISTORY:  Migraines: She has had migraines since age 50.  They are typically a 4-5/10 non-throbbing pressure across the forehead with nausea, photophobia and phonophobia but they have changed around 2018 associated with elevated blood pressure and her chronic pain.  She developed new severe headaches in December 2021, a 10/10 pressure over the top of her head. It lasted for several hours.  Associated nausea, photophobia and phonophobia, black floaters in vision, dyspnea and increased sound of her tinnitus but no vomiting.  She couldn't rest.  They have been occurring 3 days a week since onset. She  reports similar episode in 2019 related to stress.  Blood pressure has been elevated, reportedly 160s systolic.  MRI of brain with and without contrast and MRA of head on 07/22/2020 were normal.    Neck pain: Endorsed neck pain.  Neck turning or tilting will aggravate pain which may trigger a migraine.  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  Past treatments:  PT, Flexeril .  Carpal Tunnel Syndrome: Has numbness in the hands.  NCV-EMG upper extremities on 08/30/2022 showed bilateral moderate CTS.   Past NSAIDS/analgesics:  Codeine (side effect), ibuprofen , ketorolac , Mobic , tramadol  Past abortive triptans:  none Past abortive ergotamine:  none Past muscle relaxants:  Robaxin , Flexeril  Past anti-emetic:  Zofran  ODT 4mg  Past antihypertensive medications:  none Past antidepressant medications:  Sertraline, Wellbutrin Past anticonvulsant medications:  gabapentin (caused eye twitching), topiramate  (upset her stomach) Past anti-CGRP:  none Past vitamins/Herbal/Supplements:  none Past antihistamines/decongestants:  none Other past therapies:  PT (myofascial neck pain)     Family history of headache:  She is not aware  PAST MEDICAL HISTORY: Past Medical History:  Diagnosis Date   Anxiety    Back pain    Depression    Hypertension     MEDICATIONS: Current Outpatient Medications on File Prior to Visit  Medication Sig Dispense Refill   aluminum chloride (DRYSOL) 20 % external solution APPLY SMALL AMOUNT TOPICALLY AS DIRECTED APPLY TO CLEAN UNDERARMS AT NIGHT AND WASH OFF IN THE MORNING. USE 2  NIGHTS A WEEK AND IF TOLERATING CAN INCREASE TO EVERY OTHER NIGHT. APPLY TO CLEAN UNDERARMS AT NIGHT AND WASH OFF IN THE MORNING. USE 2   NIGHTS A WEEK AND IF TOLERATING CAN INCREASE TO EVERY OTHER NIGHT.     Benzoyl Peroxide 5 % lotion APPLY SMALL AMOUNT TOPICALLY AS DIRECTED APPLY TO GROIN AREA EVERY OTHER DAY TO DAILY     Cholecalciferol (VITAMIN  D-1000 MAX ST) 25 MCG (1000 UT) tablet Take 2 tablets by mouth daily.     estradiol (ESTRACE) 0.1 MG/GM vaginal cream APPLY ONE GRAM INTO TOPICALLY AS DIRECTED TUESDAYS AND THURSDAYS EXTERNALLY AT BEDTIME  ONLY TO AREAS OF CONCERN     famotidine  (PEPCID ) 20 MG tablet Take 1 tablet (20 mg total) by mouth 2 (two) times daily. 20 tablet 0   Fremanezumab -vfrm (AJOVY ) 225 MG/1.5ML SOAJ Inject 225 mg into the skin every 30 (thirty) days. 1.68 mL 5   ketoconazole (NIZORAL) 2 % shampoo      lisinopril (ZESTRIL) 40 MG tablet Take 40 mg by mouth.     metoprolol succinate (TOPROL-XL) 25 MG 24 hr tablet Take 25 mg by mouth daily. TAKE HALF TABLET BY MOUTH EVERY DAY FOR HEART RATE CONTROL     tiZANidine  (ZANAFLEX ) 2 MG tablet Take 1 tablet (2 mg total) by mouth 3 (three) times daily as needed for muscle spasms. 90 tablet 5   No current facility-administered medications on file prior to visit.    ALLERGIES: Allergies  Allergen Reactions   Codeine Other (See Comments)   Hctz [Hydrochlorothiazide] Other (See Comments)    Causes her potassium to drop   Trimebutine    Sertraline Anxiety    FAMILY HISTORY: Family History  Problem Relation Age of Onset   Diabetes Mother  Objective:  Blood pressure 116/75, pulse 67, height 5' 6 (1.676 m), weight 227 lb (103 kg), SpO2 98%. General: No acute distress.  Patient appears well-groomed.   Head:  Normocephalic/atraumatic Neck:  Supple.  Bilateral suboccipital and paraspinal tenderness.  Full range of motion. Heart:  Regular rate and rhythm. Neuro:  Alert and oriented.  Speech fluent and not dysarthric.  Language intact.  CN II-XII intact.  Bulk and tone normal.  Muscle strength 5/5 throughout.  Sensation to light touch reduced in digits 1 to 4 in hands.  Deep tendon reflexes 2+ throughout, toes downgoing.  Gait normal.  Romberg negative.    Juliene Dunnings, DO  CC: Harlene Bride, PA-C

## 2024-04-29 ENCOUNTER — Ambulatory Visit (INDEPENDENT_AMBULATORY_CARE_PROVIDER_SITE_OTHER): Admitting: Neurology

## 2024-04-29 ENCOUNTER — Encounter: Payer: Self-pay | Admitting: Neurology

## 2024-04-29 VITALS — BP 116/75 | HR 67 | Ht 66.0 in | Wt 227.0 lb

## 2024-04-29 DIAGNOSIS — G5603 Carpal tunnel syndrome, bilateral upper limbs: Secondary | ICD-10-CM

## 2024-04-29 DIAGNOSIS — G43009 Migraine without aura, not intractable, without status migrainosus: Secondary | ICD-10-CM

## 2024-04-29 DIAGNOSIS — M542 Cervicalgia: Secondary | ICD-10-CM | POA: Diagnosis not present

## 2024-04-29 NOTE — Patient Instructions (Addendum)
 Check MRI of cervical spine Refer to hand specialist Continue Ajovy  Take tizanidine  2mg  up to 3 times daily as needed.  If ineffective or side effects, let me know. Follow up 7 months.

## 2024-05-01 ENCOUNTER — Telehealth: Payer: Self-pay | Admitting: Neurology

## 2024-05-01 ENCOUNTER — Other Ambulatory Visit: Payer: Self-pay

## 2024-05-01 ENCOUNTER — Encounter: Payer: Self-pay | Admitting: Neurology

## 2024-05-01 MED ORDER — TIZANIDINE HCL 2 MG PO TABS: 2.0000 mg | ORAL_TABLET | Freq: Three times a day (TID) | ORAL | 5 refills | Status: AC | PRN

## 2024-05-01 MED ORDER — AJOVY 225 MG/1.5ML ~~LOC~~ SOAJ: 225.0000 mg | SUBCUTANEOUS | 5 refills | Status: AC

## 2024-05-01 NOTE — Telephone Encounter (Signed)
 Patient stop by, Needs Tizanidine  and Ajovy  refilled to the [facility].   Refills sent.

## 2024-05-01 NOTE — Telephone Encounter (Signed)
 See other refill encounter.

## 2024-05-01 NOTE — Telephone Encounter (Signed)
 Pt came in this afternoon and she needs a refill on the prescriptions  called: Fremanezumab -vfrm (AJOVY ) 225 MG/1.5ML SOAJ  and

## 2024-05-13 ENCOUNTER — Ambulatory Visit: Admitting: Neurology

## 2024-06-26 ENCOUNTER — Encounter: Payer: Self-pay | Admitting: Neurology

## 2024-06-26 ENCOUNTER — Telehealth: Payer: Self-pay | Admitting: Neurology

## 2024-06-26 NOTE — Telephone Encounter (Signed)
 Carolyn Gregory called in wanting to let Carolyn Gregory know that she is scheduled 07/06/24 for MRI/

## 2024-06-26 NOTE — Telephone Encounter (Signed)
 Advised patient will send a note to [facility]. Phone number given to call and schedule.

## 2024-06-26 NOTE — Telephone Encounter (Signed)
 Pt called in this afternoon and stated that her PCP stated that the Dr . Damien order the MRI for the neck, it is within the authorization that is listed. Thanks

## 2024-06-29 ENCOUNTER — Encounter: Payer: Self-pay | Admitting: Neurology

## 2024-07-06 ENCOUNTER — Ambulatory Visit
Admission: RE | Admit: 2024-07-06 | Discharge: 2024-07-06 | Disposition: A | Source: Ambulatory Visit | Attending: Neurology

## 2024-07-06 DIAGNOSIS — M542 Cervicalgia: Secondary | ICD-10-CM

## 2024-07-16 ENCOUNTER — Ambulatory Visit: Payer: Self-pay | Admitting: Neurology

## 2024-07-16 ENCOUNTER — Telehealth: Payer: Self-pay | Admitting: Neurology

## 2024-07-16 NOTE — Telephone Encounter (Signed)
 Pt has other issues for which she discussed with Dr. Skeet on last visit, she made Appt with PCP but feels like they will just tell her to speak with Dr. Skeet, She would like call bk to discuss next steps or any help prior to her PCP appt

## 2024-07-16 NOTE — Telephone Encounter (Signed)
 Complains of Sensations of chills in her head. They are not migraines and pressure at her temporals.

## 2024-07-16 NOTE — Telephone Encounter (Signed)
 Patient advised of her results.   Agree to the referral.

## 2024-07-16 NOTE — Telephone Encounter (Signed)
-----   Message from Juliene Dunnings, DO sent at 07/16/2024 12:27 PM EST ----- MRI of cervical spine shows some arthritic changes that may contribute to neck pain.  If physical therapy didn't work, we can refer to a spine specialist for suggestions of non-surgical options.

## 2024-07-21 NOTE — Progress Notes (Unsigned)
 "  Virtual Visit via Video Note:   Consent was obtained for video visit:  Yes.   Answered questions that patient had about telehealth interaction:  Yes.   I discussed the limitations, risks, security and privacy concerns of performing an evaluation and management service by telemedicine. I also discussed with the patient that there may be a patient responsible charge related to this service. The patient expressed understanding and agreed to proceed.  Pt location: Home Physician Location: office Name of referring provider:  Dexter Harlene BIRCH, PA-C I connected with Carolyn Gregory Rieger at patients initiation/request on 07/23/2024 at  9:50 AM EST by video enabled telemedicine application and verified that I am speaking with the correct person using two identifiers. Pt MRN:  [id number] Pt DOB:  1960-07-26 Video Participants:  Carolyn Gregory Rieger  Assessment/Plan:    Scalp neuralgia/allodynia Cervicogenic headache/cervicalgia Migraine without aura, without status migrainosus, not intractable Bilateral carpal tunnel syndrome - she does have carpal tunnel syndrome but more recent wrist and thumb pain may be tendonitis vs arthritis.   For further evaluation of head/scalp sensation will check MRI of brain with and without contrast.  Start nortriptyline  10mg  at bedtime.  Increase to 25mg  at bedtime in 4 weeks if needed Advised to follow up with PCP to evaluate the bump on her scalp  Regarding cervicogenic headache/cervical spondylosis: will refer to spine specialist for suggestions of possible non-surgical options. Tizanidine  2mg  three times daily Migraine prevention:   Ajovy  every 4 weeks Limit use of pain relievers to no more than 9 days out of the month to prevent risk of rebound or medication-overuse headache. Lifestyle modification    Subjective:  Carolyn Gregory. Carolyn Gregory is a 64 year old right-handed female with back pain, HTN and depression/anxiety who presents for altered sensation in her head.   MRI C-spine personally reviewed.   UPDATE: Head sensation: For the past several months, internal cold sensation diffusely in the head.  Involves the left posterior frontal/parietal region. It is a raised area about the size of the tip of the index finger, over the scalp.  It is persistent but any light touch will be uncomfortable.  There is no rash.  It triggers that cool sensation that radiates to the right side and back of the head.  There is also a tight sensation over her head, like she is wearing a head band.    Cervicogenic headache: Increased tizanidine  to 2mg  three times daily.  No improvement.    MRI of cervical spine on 07/06/2024 revealed multilevel cervical spondylosis with mild bilateral facet hypertrophy and small central disc protrusion at C2-C3 indenting the ventral thecal sac without spinal or foraminal stenosis, mild stenosis at C3-4 and C4-5 and moderate left C4 and right C5 foraminal stenosis related to disc bulging and uncovertebral disease.  We have referred to a spine specialist to suggest non-surgical options.    Carpal tunnel syndrome: Following last visit, she developed pain in wrist and left thumb.  She has seen a hand specialist and was told her symptoms were not carpal tunnel.  Continues to have numbness in the hands.  Was given exercises to strengthen the hand muscles.     Current NSAIDS/analgesics:  none Current triptans:  none Current ergotamine:  none Current anti-emetic:  none Current muscle relaxants:  tizanidine  2mg  TID PRN (has only been taking at bedtime) Current Antihypertensive medications:  metoprolol,lisinopril Current Antidepressant medications:  Lexapro Current Anticonvulsant medications:   Current anti-CGRP:  none Current Vitamins/Herbal/Supplements:  Magnesium  oxide 400mg   TID Current Antihistamines/Decongestants:  Cetirizine  Other therapy:  massage therapy (neck pain) Hormone/birth control:  Estradiol   Caffeine:  No coffee.  Tea  infrequently.  No soda Diet:  Keeps hydrated.  Skips meals. No soda Exercise:  walks Depression:  yes; Anxiety:  Yes, gotten worse. Other pain:  Chronic pain in back, knee Sleep hygiene:  Trouble sleeping. Related to stress.    HISTORY:  Migraines: She has had migraines since age 64.  They are typically a 4-5/10 non-throbbing pressure across the forehead with nausea, photophobia and phonophobia but they have changed around 2018 associated with elevated blood pressure and her chronic pain.  She developed new severe headaches in December 2021, a 10/10 pressure over the top of her head. It lasted for several hours.  Associated nausea, photophobia and phonophobia, black floaters in vision, dyspnea and increased sound of her tinnitus but no vomiting.  She couldn't rest.  They have been occurring 3 days a week since onset. She reports similar episode in 2019 related to stress.  Blood pressure has been elevated, reportedly 160s systolic.  MRI of brain with and without contrast and MRA of head on 07/22/2020 were normal.    Neck pain: Endorsed neck pain.  Neck turning or tilting will aggravate pain which may trigger a migraine.  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  Past treatments:  PT, Flexeril .  Cervicogenic headache: In October 2025, she was receiving massage therapy.  Palpation to the occipital region triggered a new headache that progressively got worse.  A severe debilitating shooting pain radiating up bilateral posterior neck (worse on the right) to the bilateral occipital region and diffuse coldness in her head.  She also has been having some dizziness and blurred vision.  She was in bed for several days and when symptoms didn't improve, she went to the ED at the [facility] on 10/9.  She had blood work to test for inflammatory markers (suspect sed rate), which were reportedly normal.  She was given Tylenol  and discharged.  No radicular pain.     Carpal Tunnel Syndrome: Has numbness in the hands.  NCV-EMG upper extremities on 08/30/2022 showed bilateral moderate CTS.   Past NSAIDS/analgesics:  Codeine (side effect), ibuprofen , ketorolac , Mobic , tramadol  Past abortive triptans:  none Past abortive ergotamine:  none Past muscle relaxants:  Robaxin , Flexeril  Past anti-emetic:  Zofran  ODT 4mg  Past antihypertensive medications:  none Past antidepressant medications:  Sertraline, Wellbutrin Past anticonvulsant medications:  gabapentin (caused eye twitching), topiramate  (upset her stomach) Past anti-CGRP:  none Past vitamins/Herbal/Supplements:  none Past antihistamines/decongestants:  none Other past therapies:  PT (myofascial neck pain)     Family history of headache:  She is not aware  Past Medical History: Past Medical History:  Diagnosis Date   Anxiety    Back pain    Depression    Hypertension     Medications: Outpatient Encounter Medications as of 07/23/2024  Medication Sig   aluminum chloride (DRYSOL) 20 % external solution APPLY SMALL AMOUNT TOPICALLY AS DIRECTED APPLY TO CLEAN UNDERARMS AT NIGHT AND WASH OFF IN THE MORNING. USE 2  NIGHTS A WEEK AND IF TOLERATING CAN INCREASE TO EVERY OTHER NIGHT. APPLY TO CLEAN UNDERARMS AT NIGHT AND WASH OFF IN THE MORNING. USE 2   NIGHTS A WEEK AND IF TOLERATING CAN INCREASE TO EVERY OTHER NIGHT.   Benzoyl Peroxide 5 % lotion APPLY SMALL AMOUNT TOPICALLY AS DIRECTED APPLY TO GROIN AREA EVERY OTHER DAY TO DAILY  Cholecalciferol (VITAMIN D-1000 MAX ST) 25 MCG (1000 UT) tablet Take 2 tablets by mouth daily.   estradiol (ESTRACE) 0.1 MG/GM vaginal cream APPLY ONE GRAM INTO TOPICALLY AS DIRECTED TUESDAYS AND THURSDAYS EXTERNALLY AT BEDTIME  ONLY TO AREAS OF CONCERN   famotidine  (PEPCID ) 20 MG tablet Take 1 tablet (20 mg total) by mouth 2 (two) times daily.   Fremanezumab -vfrm (AJOVY ) 225 MG/1.5ML SOAJ Inject 225 mg into the skin every 30 (thirty) days.   ketoconazole (NIZORAL) 2 %  shampoo    lisinopril (ZESTRIL) 40 MG tablet Take 40 mg by mouth.   tiZANidine  (ZANAFLEX ) 2 MG tablet Take 1 tablet (2 mg total) by mouth 3 (three) times daily as needed for muscle spasms.   No facility-administered encounter medications on file as of 07/23/2024.    Allergies: Allergies[1]  Family History: Family History  Problem Relation Age of Onset   Diabetes Mother     Observations/Objective:   No acute distress.  Alert and oriented.  Speech fluent and not dysarthric.  Language intact.  Eyes orthophoric on primary gaze.  Face symmetric.   Follow up Instructions:      -I discussed the assessment and treatment plan with the patient. The patient was provided an opportunity to ask questions and all were answered. The patient agreed with the plan and demonstrated an understanding of the instructions.   The patient was advised to call back or seek an in-person evaluation if the symptoms worsen or if the condition fails to improve as anticipated.   Juliene Lamar Dunnings, DO  CC: Harlene Bride, PA-C          [1]  Allergies Allergen Reactions   Codeine Other (See Comments)   Hctz [Hydrochlorothiazide] Other (See Comments)    Causes her potassium to drop   Trimebutine    Sertraline Anxiety   "

## 2024-07-23 ENCOUNTER — Telehealth (INDEPENDENT_AMBULATORY_CARE_PROVIDER_SITE_OTHER): Admitting: Neurology

## 2024-07-23 ENCOUNTER — Encounter: Payer: Self-pay | Admitting: Neurology

## 2024-07-23 DIAGNOSIS — G4486 Cervicogenic headache: Secondary | ICD-10-CM

## 2024-07-23 DIAGNOSIS — G43009 Migraine without aura, not intractable, without status migrainosus: Secondary | ICD-10-CM | POA: Diagnosis not present

## 2024-07-23 DIAGNOSIS — M792 Neuralgia and neuritis, unspecified: Secondary | ICD-10-CM | POA: Diagnosis not present

## 2024-07-23 MED ORDER — NORTRIPTYLINE HCL 10 MG PO CAPS: 10.0000 mg | ORAL_CAPSULE | Freq: Every day | ORAL | 5 refills | Status: AC

## 2024-07-27 NOTE — Addendum Note (Signed)
 Addended by: OZELL JESUSA PARAS on: 07/27/2024 11:26 AM   Modules accepted: Orders

## 2024-08-07 ENCOUNTER — Encounter: Payer: Self-pay | Admitting: Neurology

## 2024-08-12 ENCOUNTER — Inpatient Hospital Stay: Admission: RE | Admit: 2024-08-12 | Discharge: 2024-08-12 | Attending: Neurology

## 2024-08-12 DIAGNOSIS — M792 Neuralgia and neuritis, unspecified: Secondary | ICD-10-CM

## 2024-08-12 MED ORDER — GADOPICLENOL 0.5 MMOL/ML IV SOLN
10.0000 mL | Freq: Once | INTRAVENOUS | Status: AC | PRN
  Administered 2024-08-12: 10 mL via INTRAVENOUS

## 2024-12-07 ENCOUNTER — Ambulatory Visit: Admitting: Neurology
